# Patient Record
Sex: Female | Born: 1935 | Race: White | Hispanic: No | Smoking: Former smoker
Health system: Southern US, Community
[De-identification: ages and names within clinical notes are randomized; demographics above are authoritative.]

## PROBLEM LIST (undated history)

## (undated) VITALS — BP 189/88 | HR 61 | Temp 98.1°F | Resp 16 | Ht 62.0 in | Wt 169.0 lb

## (undated) DIAGNOSIS — N189 Chronic kidney disease, unspecified: Secondary | ICD-10-CM

## (undated) DIAGNOSIS — I10 Essential (primary) hypertension: Secondary | ICD-10-CM

## (undated) DIAGNOSIS — C50911 Malignant neoplasm of unspecified site of right female breast: Secondary | ICD-10-CM

## (undated) DIAGNOSIS — C641 Malignant neoplasm of right kidney, except renal pelvis: Secondary | ICD-10-CM

## (undated) DIAGNOSIS — C50919 Malignant neoplasm of unspecified site of unspecified female breast: Secondary | ICD-10-CM

## (undated) DIAGNOSIS — Z7901 Long term (current) use of anticoagulants: Secondary | ICD-10-CM

## (undated) DIAGNOSIS — R011 Cardiac murmur, unspecified: Secondary | ICD-10-CM

## (undated) DIAGNOSIS — K649 Unspecified hemorrhoids: Secondary | ICD-10-CM

## (undated) DIAGNOSIS — K219 Gastro-esophageal reflux disease without esophagitis: Secondary | ICD-10-CM

## (undated) DIAGNOSIS — I2699 Other pulmonary embolism without acute cor pulmonale: Secondary | ICD-10-CM

## (undated) DIAGNOSIS — Z923 Personal history of irradiation: Secondary | ICD-10-CM

## (undated) DIAGNOSIS — M1990 Unspecified osteoarthritis, unspecified site: Secondary | ICD-10-CM

## (undated) DIAGNOSIS — I499 Cardiac arrhythmia, unspecified: Secondary | ICD-10-CM

## (undated) DIAGNOSIS — N289 Disorder of kidney and ureter, unspecified: Secondary | ICD-10-CM

## (undated) HISTORY — DX: Unspecified hemorrhoids: K64.9

## (undated) HISTORY — DX: Other pulmonary embolism without acute cor pulmonale: I26.99

## (undated) HISTORY — PX: OTHER SURGICAL HISTORY: SHX169

## (undated) HISTORY — DX: Long term (current) use of anticoagulants: Z79.01

## (undated) HISTORY — DX: Unspecified osteoarthritis, unspecified site: M19.90

## (undated) HISTORY — PX: DILATION AND CURETTAGE OF UTERUS: SHX78

## (undated) HISTORY — PX: EYE SURGERY: SHX253

---

## 1988-04-27 DIAGNOSIS — C50919 Malignant neoplasm of unspecified site of unspecified female breast: Secondary | ICD-10-CM

## 1988-04-27 HISTORY — PX: BREAST EXCISIONAL BIOPSY: SUR124

## 1988-04-27 HISTORY — DX: Malignant neoplasm of unspecified site of unspecified female breast: C50.919

## 1988-04-27 HISTORY — PX: BREAST LUMPECTOMY: SHX2

## 1990-04-27 HISTORY — PX: BREAST EXCISIONAL BIOPSY: SUR124

## 1990-04-27 HISTORY — PX: BREAST LUMPECTOMY: SHX2

## 2005-02-13 ENCOUNTER — Ambulatory Visit: Payer: Self-pay | Admitting: Family Medicine

## 2006-05-13 ENCOUNTER — Ambulatory Visit: Payer: Self-pay | Admitting: Internal Medicine

## 2007-10-25 ENCOUNTER — Ambulatory Visit: Payer: Self-pay | Admitting: Internal Medicine

## 2009-05-02 ENCOUNTER — Ambulatory Visit: Payer: Self-pay | Admitting: Internal Medicine

## 2010-05-07 ENCOUNTER — Ambulatory Visit: Payer: Self-pay | Admitting: Internal Medicine

## 2011-07-30 ENCOUNTER — Ambulatory Visit: Payer: Self-pay | Admitting: Internal Medicine

## 2011-10-17 DIAGNOSIS — I10 Essential (primary) hypertension: Secondary | ICD-10-CM

## 2011-11-16 ENCOUNTER — Ambulatory Visit: Payer: Self-pay | Admitting: Ophthalmology

## 2011-11-30 ENCOUNTER — Ambulatory Visit: Payer: Self-pay | Admitting: Ophthalmology

## 2012-02-01 ENCOUNTER — Ambulatory Visit: Payer: Self-pay | Admitting: Ophthalmology

## 2012-04-27 HISTORY — PX: BREAST EXCISIONAL BIOPSY: SUR124

## 2012-08-24 ENCOUNTER — Ambulatory Visit: Payer: Self-pay | Admitting: Internal Medicine

## 2012-08-25 ENCOUNTER — Ambulatory Visit: Payer: Self-pay | Admitting: Internal Medicine

## 2012-09-06 ENCOUNTER — Ambulatory Visit: Payer: Self-pay | Admitting: General Surgery

## 2012-09-08 ENCOUNTER — Ambulatory Visit (INDEPENDENT_AMBULATORY_CARE_PROVIDER_SITE_OTHER): Payer: Medicare Other | Admitting: General Surgery

## 2012-09-08 ENCOUNTER — Other Ambulatory Visit: Payer: Self-pay

## 2012-09-08 ENCOUNTER — Encounter: Payer: Self-pay | Admitting: General Surgery

## 2012-09-08 VITALS — BP 144/86 | HR 86 | Resp 18 | Ht 62.0 in | Wt 189.0 lb

## 2012-09-08 DIAGNOSIS — N63 Unspecified lump in unspecified breast: Secondary | ICD-10-CM

## 2012-09-08 DIAGNOSIS — N6310 Unspecified lump in the right breast, unspecified quadrant: Secondary | ICD-10-CM

## 2012-09-08 DIAGNOSIS — R928 Other abnormal and inconclusive findings on diagnostic imaging of breast: Secondary | ICD-10-CM

## 2012-09-08 NOTE — Patient Instructions (Addendum)
Patient to return in 1 week for Nurse visit.       CARE AFTER BREAST BIOPSY  1. Leave the dressing on that your doctor applied after surgery. It is waterproof. You may bathe, shower and/or swim. The dressing will probably remain intact until your return office visit. If the dressing comes off, you will see small strips of tape against your skin on the incision. Do not remove these strips.  2. You may want to use a gauze,cloth or similar protection in your bra to prevent rubbing against your dressing and incision. This is not necessary, but you may feel more comfortable doing so.  3. It is recommended that you wear a bra day and night to give support to the breast. This will prevent the weight of the breast from pulling on the incision.  4. Your breast will feel hard and lumpy under the incision. Do not be alarmed. This is the underlying stitching of tissue. Softening of this tissue will occur in time.  5. Make sure you call the office and schedule an appointment in one week after your surgery. The office phone number is [phone]. The nurses at [facility] may have already done this for you.  6. You will notice about a week after your office visit that the strips of the tape on your incision will begin to loosen. These may then be removed.  7. Report to your doctor any of the following:  * Severe pain not relieved by your pain medication  *Redness of the incision  * Drainage from the incision  *Fever greater than 101 degrees

## 2012-09-08 NOTE — Progress Notes (Signed)
Patient ID: SAN RUA, female   DOB: 1932/03/24, 79 y.o.   MRN: [id number]  Chief Complaint  Patient presents with  . Other    mammogram    HPI Tara Ramos is a 79 y.o. female. Patient here today for abnormal mammogram referred by Dr Thalia Party. Most recent mammogram was done on 08/24/12 with a birad category 0. Additional views of the right breast were done on 08/25/12 with a birad category 5. The patient has a history of bilateral breast cancer diagnosed two years apart. She underwent lumpectomies and radiation therapy with both. She admits to regular self breast checks as well as regular mammograms. No known family history of breast problems. She denies any new problems with her breasts. No complaints at this time.   The patient's prior breast cancer treatment in the early 1990s was completed at [facility] in [place], [place]. Her surgeon was Paulla Dolly, M.D.  She received brachy therapy (Multiple "straws" placed in the skin) as well as whole breast radiation to the right breast, but only external beam radiation to the left breast.   HPI  Past Medical History  Diagnosis Date  . Hemorrhoids   . Arthritis   . Cancer     breast    Past Surgical History  Procedure Laterality Date  . Breast lumpectomy Left 1990  . Breast lumpectomy Right 1992  . Other surgical history      radiation therapy breast    Family History  Problem Relation Age of Onset  . Stroke Father   . Stomach cancer Mother   . Brain cancer Sister     Social History History  Substance Use Topics  . Smoking status: Former Smoker -- 20 years    Types: Cigarettes    Quit date: 04/27/1972  . Smokeless tobacco: Never Used  . Alcohol Use: Yes    No Known Allergies  Current Outpatient Prescriptions  Medication Sig Dispense Refill  . amLODipine (NORVASC) 2.5 MG tablet Take 2.5 mg by mouth daily.      . Cholecalciferol (VITAMIN D PO) Take by mouth daily.      . hydrochlorothiazide (HYDRODIURIL)  25 MG tablet Take 50 mg by mouth daily.      Marland Kitchen losartan (COZAAR) 100 MG tablet Take 100 mg by mouth daily.      . metoprolol (LOPRESSOR) 100 MG tablet Take 100 mg by mouth 2 (two) times daily.      . Multiple Vitamins-Minerals (CENTRUM SILVER ULTRA WOMENS PO) Take by mouth daily.      Marland Kitchen omeprazole (PRILOSEC) 20 MG capsule Take 20 mg by mouth daily.       No current facility-administered medications for this visit.    Review of Systems Review of Systems  Constitutional: Negative.   Respiratory: Negative.   Cardiovascular: Negative.     Blood pressure 144/86, pulse 86, resp. rate 18, height 5\' 2"  (1.575 m), weight 189 lb (85.73 kg).  Physical Exam Physical Exam  Constitutional: She appears well-developed and well-nourished.  Neck: Trachea normal. No mass and no thyromegaly present.  Cardiovascular: Normal rate, regular rhythm, normal heart sounds and normal pulses.   No murmur heard. Pulmonary/Chest: Effort normal and breath sounds normal. Right breast exhibits no inverted nipple, no mass, no nipple discharge, no skin change and no tenderness. Left breast exhibits no inverted nipple, no mass, no nipple discharge, no skin change and no tenderness.  Right breast 1/2 cup size bigger than left breast. Well healed scar in  the upper outer quadrant of right breast.   3 cm focal thickening between the breast and axillary scar of right breast.   Well healed scar in the upper outer quadrant of left breast.   Lymphadenopathy:    She has no cervical adenopathy.    She has no axillary adenopathy.    Data Reviewed Bilateral mammograms dated 08/24/2012 showed a focal asymmetry in the upper outer quadrant right breast with calcifications. BI-RAD-0.  Focal spot compression views of the right breast an ultrasound dated 08/25/2012 showed a spiculated nodule at the tie clock position ultrasound confirmed an irregular marginated hypoechoic mass measuring up to 1 cm with posterior acoustic shadowing.  BI-RAD-5.  Laboratory studies dated 07/19/2012 showed an elevated hemoglobin 15.2, white blood cell count 7400, platelet count 306,000, creatinine 0.8, normal liver function studies, minimal elevation of cholesterol.  Ultrasound examination of the right breast in the upper-outer quadrant showed an irregular 0.7 x 0.7 x 1.1 hypoechoic mass with posterior acoustic shadowing at the 10:00 position, 6 admission nipple.  The patient was amenable to vacuum biopsy. 10 cc of 0.5% Xylocaine with 0.25% Marcaine with 1:200,000 units epinephrine was usual anesthesia well tolerated. The breast was prepped with ChloraPrep and draped. Under ultrasound guidance the mass was a biopsied x8. Scant bleeding was noted. A postbiopsy clip was placed. The patient tolerated the procedure well. Skin defect was closed with benzoin and Steri-Strips followed by Telfa and Tegaderm dressing.  Assessment    Right breast mass now 20+ years status post treatment of right breast cancer.    Plan    The patient will be contacted when pathology is available.       [name] 09/09/2012, 7:40 AM

## 2012-09-09 ENCOUNTER — Encounter: Payer: Self-pay | Admitting: General Surgery

## 2012-09-09 DIAGNOSIS — R928 Other abnormal and inconclusive findings on diagnostic imaging of breast: Secondary | ICD-10-CM | POA: Insufficient documentation

## 2012-09-13 ENCOUNTER — Telehealth: Payer: Self-pay | Admitting: General Surgery

## 2012-09-13 LAB — PATHOLOGY

## 2012-09-13 NOTE — Telephone Encounter (Signed)
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 - MICROCALCIFICATIONS ARE PRESENT  The mammograms have been reviewed. Calcifications were present in the area of density.  Because of the previous radiation is difficult to determine whether this is sclerosis secondary to radiation or to malignancy. The need for formal excision of this area was reviewed with the patient.  She will return on Thursday, May 22 for a wound evaluation with the nurse. Arrangements will be made for a formal operative excision at a convenient date the near future.

## 2012-09-15 ENCOUNTER — Ambulatory Visit (INDEPENDENT_AMBULATORY_CARE_PROVIDER_SITE_OTHER): Payer: Medicare Other | Admitting: *Deleted

## 2012-09-15 DIAGNOSIS — N63 Unspecified lump in unspecified breast: Secondary | ICD-10-CM

## 2012-09-15 DIAGNOSIS — N6310 Unspecified lump in the right breast, unspecified quadrant: Secondary | ICD-10-CM

## 2012-09-15 NOTE — Patient Instructions (Addendum)
Patient here today for follow up post right breast biopsy. Minimal bruising noted.  The patient is aware that a heating pad may be used for comfort as needed.  Aware of pathology. Follow up as scheduled. 

## 2012-09-26 ENCOUNTER — Ambulatory Visit: Payer: Self-pay | Admitting: General Surgery

## 2012-11-16 ENCOUNTER — Telehealth: Payer: Self-pay

## 2012-11-16 NOTE — Telephone Encounter (Signed)
Patient called to say she had not heard back from [place] since her nurse visit on May 22nd. She would like a call back from Dr Lemar Livings to discuss her options for treatment and what the recommendations were. She was unaware of any previous discussions regarding any surgical intervention.

## 2012-12-07 ENCOUNTER — Ambulatory Visit (INDEPENDENT_AMBULATORY_CARE_PROVIDER_SITE_OTHER): Payer: Medicare Other | Admitting: General Surgery

## 2012-12-07 ENCOUNTER — Other Ambulatory Visit: Payer: Self-pay | Admitting: General Surgery

## 2012-12-07 ENCOUNTER — Encounter: Payer: Self-pay | Admitting: General Surgery

## 2012-12-07 ENCOUNTER — Telehealth: Payer: Self-pay | Admitting: *Deleted

## 2012-12-07 VITALS — BP 144/82 | HR 76 | Resp 14 | Ht 62.0 in | Wt 191.0 lb

## 2012-12-07 DIAGNOSIS — N63 Unspecified lump in unspecified breast: Secondary | ICD-10-CM

## 2012-12-07 DIAGNOSIS — R928 Other abnormal and inconclusive findings on diagnostic imaging of breast: Secondary | ICD-10-CM

## 2012-12-07 DIAGNOSIS — N6310 Unspecified lump in the right breast, unspecified quadrant: Secondary | ICD-10-CM | POA: Insufficient documentation

## 2012-12-07 NOTE — Patient Instructions (Addendum)
Patient to have right breast wire localization and biopsy scheduled.

## 2012-12-07 NOTE — Progress Notes (Signed)
Patient ID: Tara Ramos, female   DOB: 1931-11-15, 79 y.o.   MRN: [id number]  Chief Complaint  Patient presents with  . Follow-up    breast discussion    HPI Tara Ramos is a 79 y.o. female who presents for a breast discussion. At her last visit on 09/08/12 she had a right breast biopsy. She denies any problems at this time.   The patient's followup was delayed by my surgery in May.  The patient reports feeling well.  She had been notified that atypical cells had been identified on biopsy.  HPI  Past Medical History  Diagnosis Date  . Hemorrhoids   . Arthritis   . Cancer     breast    Past Surgical History  Procedure Laterality Date  . Breast lumpectomy Left 1990  . Breast lumpectomy Right 1992  . Other surgical history      radiation therapy breast  . Breast biopsy Right 2014    ATYPICAL DUCTAL PROLIFERATION WITH MARKED DISTORTION BY    Family History  Problem Relation Age of Onset  . Stroke Father   . Stomach cancer Mother   . Brain cancer Sister     Social History History  Substance Use Topics  . Smoking status: Former Smoker -- 20 years    Types: Cigarettes    Quit date: 04/27/1972  . Smokeless tobacco: Never Used  . Alcohol Use: Yes    Allergies  Allergen Reactions  . Morphine And Related Nausea And Vomiting    Current Outpatient Prescriptions  Medication Sig Dispense Refill  . amLODipine (NORVASC) 2.5 MG tablet Take 2.5 mg by mouth daily.      . Cholecalciferol (VITAMIN D PO) Take by mouth daily.      . hydrochlorothiazide (HYDRODIURIL) 25 MG tablet Take 50 mg by mouth daily.      Marland Kitchen losartan (COZAAR) 100 MG tablet Take 100 mg by mouth daily.      . metoprolol (LOPRESSOR) 100 MG tablet Take 100 mg by mouth 2 (two) times daily.      . Multiple Vitamins-Minerals (CENTRUM SILVER ULTRA WOMENS PO) Take by mouth daily.      Marland Kitchen omeprazole (PRILOSEC) 20 MG capsule Take 20 mg by mouth daily.       No current facility-administered medications for this  visit.    Review of Systems Review of Systems  Constitutional: Negative.   Respiratory: Negative.   Cardiovascular: Negative.     Blood pressure 144/82, pulse 76, resp. rate 14, height 5\' 2"  (1.575 m), weight 191 lb (86.637 kg).  Physical Exam Physical Exam  Constitutional: She is oriented to person, place, and time. She appears well-developed and well-nourished.  Neck: No thyromegaly present.  Cardiovascular: Normal rate, regular rhythm and normal heart sounds.   No murmur heard. Pulmonary/Chest: Effort normal and breath sounds normal. Right breast exhibits no inverted nipple, no mass, no nipple discharge, no skin change and no tenderness. Left breast exhibits no inverted nipple, no mass, no nipple discharge, no skin change and no tenderness.  Left breast well healed scar upper outer quadrant.   Right breast well healed scar in the upper outer quadrant.   Lymphadenopathy:    She has no cervical adenopathy.    She has no axillary adenopathy.  Neurological: She is alert and oriented to person, place, and time.  Skin: Skin is warm and dry.    Data Reviewed Atypical cells, excisional biopsy recommended on the May 2014 vacuum biopsy sample  Assessment  Atypical cells of the right breast, likely chronic scar, but with mammographic change formal excision is warranted.    Plan    The patient will have needle localization biopsy completed in the near future.       Tara Ramos 12/07/2012, 9:57 AM

## 2012-12-07 NOTE — Telephone Encounter (Signed)
Patient's surgery has been scheduled for 12-16-12 at [facility]. She is aware of all instructions and will call the office if she has futher questions. Paperwork has been mailed to the patient.

## 2012-12-12 ENCOUNTER — Ambulatory Visit: Payer: Self-pay | Admitting: General Surgery

## 2012-12-12 ENCOUNTER — Telehealth: Payer: Self-pay | Admitting: *Deleted

## 2012-12-12 LAB — POTASSIUM: Potassium: 4.3 mmol/L (ref 3.5–5.1)

## 2012-12-12 NOTE — Telephone Encounter (Signed)
Marsha from Pre-admit called regarding orders on this patient for surgery scheduled for Friday, 12-16-12. They are questioning permit and layman's terms because it does not say anything about removing the lesion. Please correct and let me know so I can send corrected copy. Thanks.

## 2012-12-12 NOTE — Addendum Note (Signed)
Addended by: [name] on: 12/12/2012 04:58 PM   Modules accepted: Orders

## 2012-12-13 NOTE — Progress Notes (Signed)
Faxed corrected orders and received per Dennie Bible in Pre-admit Testing.

## 2012-12-16 ENCOUNTER — Ambulatory Visit: Payer: Self-pay | Admitting: General Surgery

## 2012-12-16 DIAGNOSIS — D4860 Neoplasm of uncertain behavior of unspecified breast: Secondary | ICD-10-CM

## 2012-12-16 DIAGNOSIS — C50911 Malignant neoplasm of unspecified site of right female breast: Secondary | ICD-10-CM

## 2012-12-16 HISTORY — DX: Malignant neoplasm of unspecified site of right female breast: C50.911

## 2012-12-23 ENCOUNTER — Encounter: Payer: Self-pay | Admitting: General Surgery

## 2012-12-27 ENCOUNTER — Encounter: Payer: Self-pay | Admitting: General Surgery

## 2012-12-28 ENCOUNTER — Encounter: Payer: Self-pay | Admitting: General Surgery

## 2012-12-28 ENCOUNTER — Ambulatory Visit (INDEPENDENT_AMBULATORY_CARE_PROVIDER_SITE_OTHER): Payer: Medicare Other | Admitting: General Surgery

## 2012-12-28 VITALS — BP 182/78 | HR 62 | Resp 14 | Ht 62.0 in | Wt 187.0 lb

## 2012-12-28 DIAGNOSIS — N6310 Unspecified lump in the right breast, unspecified quadrant: Secondary | ICD-10-CM

## 2012-12-28 DIAGNOSIS — N63 Unspecified lump in unspecified breast: Secondary | ICD-10-CM

## 2012-12-28 MED ORDER — LETROZOLE 2.5 MG PO TABS: 2.5000 mg | ORAL_TABLET | Freq: Every day | ORAL | Status: DC

## 2012-12-28 NOTE — Progress Notes (Signed)
Patient ID: Tara Ramos, female   DOB: 1931/11/29, 79 y.o.   MRN: [id number]  Chief Complaint  Patient presents with  . Routine Post Op    right breast biopsy    HPI Tara Ramos is a 79 y.o. female. here today for her post op right breast biopsy done on 12/16/12. Patient states she is doing well.  Marland KitchenHPI  Past Medical History  Diagnosis Date  . Hemorrhoids   . Arthritis   . 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    Past Surgical History  Procedure Laterality Date  . Other surgical history      radiation therapy breast  . Breast lumpectomy Left 1990  . Breast lumpectomy Right 1992  . Breast biopsy Right 2014    ATYPICAL DUCTAL PROLIFERATION WITH MARKED DISTORTION BY    Family History  Problem Relation Age of Onset  . Stroke Father   . Stomach cancer Mother   . Brain cancer Sister     Social History History  Substance Use Topics  . Smoking status: Former Smoker -- 20 years    Types: Cigarettes    Quit date: 04/27/1972  . Smokeless tobacco: Never Used  . Alcohol Use: Yes    Allergies  Allergen Reactions  . Morphine And Related Nausea And Vomiting    Current Outpatient Prescriptions  Medication Sig Dispense Refill  . amLODipine (NORVASC) 2.5 MG tablet Take 2.5 mg by mouth daily.      . Cholecalciferol (VITAMIN D PO) Take by mouth daily.      . hydrochlorothiazide (HYDRODIURIL) 25 MG tablet Take 50 mg by mouth daily.      Marland Kitchen HYDROcodone-acetaminophen (NORCO/VICODIN) 5-325 MG per tablet Take 1 tablet by mouth as needed.      Marland Kitchen lisinopril (PRINIVIL,ZESTRIL) 20 MG tablet Take 1 tablet by mouth daily.      Marland Kitchen losartan (COZAAR) 100 MG tablet Take 100 mg by mouth daily.      . metoprolol (LOPRESSOR) 100 MG tablet Take 100 mg by mouth 2 (two) times daily.      . Multiple Vitamins-Minerals (CENTRUM SILVER ULTRA WOMENS PO)  Take by mouth daily.      Marland Kitchen omeprazole (PRILOSEC) 20 MG capsule Take 20 mg by mouth daily.      Marland Kitchen letrozole (FEMARA) 2.5 MG tablet Take 1 tablet (2.5 mg total) by mouth daily.  30 tablet  12   No current facility-administered medications for this visit.    Review of Systems Review of Systems  Constitutional: Negative.   Respiratory: Negative.   Cardiovascular: Negative.     Blood pressure 182/78, pulse 62, resp. rate 14, height 5\' 2"  (1.575 m), weight 187 lb (84.823 kg).  Physical Exam Physical Exam  Constitutional: She is oriented to person, place, and time. She appears well-developed and well-nourished.  Pulmonary/Chest:  Incision in the 9:00 position of the right breast is healing well. No erythema or induration.  Neurological: She is alert and oriented to person, place, and time.  Skin: Skin is warm and dry.    Data Reviewed Ultrasound examination shows a small seroma cavity measuring 1.4 x 3.2 x 4.3 cm approximately 2 cm deep to the skin incision the  Assessment    Recurrent right breast cancer, DCIS as well as invasive mammary carcinoma. Grade 2.    Plan    The  case was discussed informally with radiation therapy. Based on her previous treatment with brachii therapy and external brain radiation a digital radiation therapy is not felt to be safe.  Considering the strongly ER positive nature of the tumor, treatment with aromatase inhibitor as appropriate.   [name] 12/30/2012, 10:54 AM

## 2012-12-28 NOTE — Patient Instructions (Addendum)
Continue self breast exams. Call office for any new breast issues or concerns. prescription for Femara

## 2012-12-30 ENCOUNTER — Encounter: Payer: Self-pay | Admitting: General Surgery

## 2013-01-02 ENCOUNTER — Encounter: Payer: Self-pay | Admitting: General Surgery

## 2013-01-09 ENCOUNTER — Encounter: Payer: Self-pay | Admitting: General Surgery

## 2013-01-13 ENCOUNTER — Encounter: Payer: Self-pay | Admitting: General Surgery

## 2013-01-30 ENCOUNTER — Encounter: Payer: Self-pay | Admitting: General Surgery

## 2013-01-30 ENCOUNTER — Ambulatory Visit (INDEPENDENT_AMBULATORY_CARE_PROVIDER_SITE_OTHER): Payer: Medicare Other | Admitting: General Surgery

## 2013-01-30 VITALS — BP 144/80 | HR 68 | Resp 14 | Ht 62.0 in | Wt 183.0 lb

## 2013-01-30 DIAGNOSIS — C50919 Malignant neoplasm of unspecified site of unspecified female breast: Secondary | ICD-10-CM

## 2013-01-30 DIAGNOSIS — C50911 Malignant neoplasm of unspecified site of right female breast: Secondary | ICD-10-CM

## 2013-01-30 NOTE — Patient Instructions (Addendum)
Patient advised to use a heating pad on right breast to help resolve fullness. Patient to return in 4 months.

## 2013-01-30 NOTE — Progress Notes (Signed)
Patient ID: Tara Ramos, female   DOB: 06-24-1931, 79 y.o.   MRN: [id number]  Chief Complaint  Patient presents with  . Follow-up    1 week follow up breast cancer    HPI Tara Ramos is a 79 y.o. female here today for her 4 week post op right breast biopsy done on 12/16/12. Patient states she is doing well. She had some concern about mild residual fullness at the wide excision site. Marland KitchenHPI   HPI  Past Medical History  Diagnosis Date  . Hemorrhoids   . Arthritis   . 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    Past Surgical History  Procedure Laterality Date  . Other surgical history      radiation therapy breast  . Breast lumpectomy Left 1990  . Breast lumpectomy Right 1992  . Breast biopsy Right 2014    ATYPICAL DUCTAL PROLIFERATION WITH MARKED DISTORTION BY    Family History  Problem Relation Age of Onset  . Stroke Father   . Stomach cancer Mother   . Brain cancer Sister     Social History History  Substance Use Topics  . Smoking status: Former Smoker -- 20 years    Types: Cigarettes    Quit date: 04/27/1972  . Smokeless tobacco: Never Used  . Alcohol Use: Yes    Allergies  Allergen Reactions  . Morphine And Related Nausea And Vomiting    Current Outpatient Prescriptions  Medication Sig Dispense Refill  . amLODipine (NORVASC) 2.5 MG tablet Take 2.5 mg by mouth daily.      . Cholecalciferol (VITAMIN D PO) Take by mouth daily.      . hydrochlorothiazide (HYDRODIURIL) 25 MG tablet Take 50 mg by mouth daily.      Marland Kitchen HYDROcodone-acetaminophen (NORCO/VICODIN) 5-325 MG per tablet Take 1 tablet by mouth as needed.      Marland Kitchen letrozole (FEMARA) 2.5 MG tablet Take 1 tablet (2.5 mg total) by mouth daily.  30 tablet  12  . lisinopril (PRINIVIL,ZESTRIL) 20 MG tablet Take 1 tablet by mouth daily.      Marland Kitchen losartan (COZAAR) 100 MG  tablet Take 100 mg by mouth daily.      . metoprolol (LOPRESSOR) 100 MG tablet Take 100 mg by mouth 2 (two) times daily.      . Multiple Vitamins-Minerals (CENTRUM SILVER ULTRA WOMENS PO) Take by mouth daily.      Marland Kitchen omeprazole (PRILOSEC) 20 MG capsule Take 20 mg by mouth daily.       No current facility-administered medications for this visit.    Review of Systems Review of Systems  Constitutional: Negative.   Respiratory: Negative.   Cardiovascular: Negative.     Blood pressure 144/80, pulse 68, resp. rate 14, height 5\' 2"  (1.575 m), weight 183 lb (83.008 kg).  Physical Exam Physical Exam  Constitutional: She is oriented to person, place, and time.  Pulmonary/Chest: Right breast exhibits no inverted nipple, no mass, no nipple discharge, no skin change and no tenderness.  3 x 5 cm fullness at right breast biopsy site.   Neurological: She is alert and oriented to person, place, and time.  Skin: Skin is warm and dry.    Data Reviewed T1b, Nx. ER +; PR + her 2 neu not over expressed.  Assessment    Doing well status post reexcision of local  right breast recurrence status post previous wide excision and breast radiation/axillary dissection.    Plan    The fullness at the biopsy site should resolve as local heat. No evidence of erythema or induration.  Previous case discussion with radiation oncology suggested no additional RT therapy option open. The patient reports she is tolerating her Femara without ill effect.       [name] 01/31/2013, 6:58 AM

## 2013-01-31 ENCOUNTER — Encounter: Payer: Self-pay | Admitting: General Surgery

## 2013-06-01 ENCOUNTER — Ambulatory Visit: Payer: Self-pay | Admitting: General Surgery

## 2013-06-26 ENCOUNTER — Encounter: Payer: Self-pay | Admitting: General Surgery

## 2013-06-26 ENCOUNTER — Ambulatory Visit (INDEPENDENT_AMBULATORY_CARE_PROVIDER_SITE_OTHER): Payer: Medicare HMO | Admitting: General Surgery

## 2013-06-26 VITALS — BP 180/100 | HR 78 | Resp 14 | Ht 62.0 in | Wt 181.0 lb

## 2013-06-26 DIAGNOSIS — C50911 Malignant neoplasm of unspecified site of right female breast: Secondary | ICD-10-CM

## 2013-06-26 DIAGNOSIS — C50919 Malignant neoplasm of unspecified site of unspecified female breast: Secondary | ICD-10-CM

## 2013-06-26 NOTE — Progress Notes (Signed)
Patient ID: Tara Ramos, female   DOB: [birth year], 80 y.o.   MRN: [id number]  Chief Complaint  Patient presents with  . Follow-up    breast cancer    HPI Tara Ramos is a 80 y.o. female here today for her four month follow up breast cancer. Patient states she is doing well.  HPI  Past Medical History  Diagnosis Date  . Hemorrhoids   . Arthritis   . 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    Past Surgical History  Procedure Laterality Date  . Other surgical history      radiation therapy breast  . Breast lumpectomy Left 1990  . Breast lumpectomy Right 1992  . Breast biopsy Right 2014    ATYPICAL DUCTAL PROLIFERATION WITH MARKED DISTORTION BY    Family History  Problem Relation Age of Onset  . Stroke Father   . Stomach cancer Mother   . Brain cancer Sister     Social History History  Substance Use Topics  . Smoking status: Former Smoker -- 20 years    Types: Cigarettes    Quit date: 04/27/1972  . Smokeless tobacco: Never Used  . Alcohol Use: Yes    Allergies  Allergen Reactions  . Morphine And Related Nausea And Vomiting    Current Outpatient Prescriptions  Medication Sig Dispense Refill  . amLODipine (NORVASC) 2.5 MG tablet Take 2.5 mg by mouth daily.      . Cholecalciferol (VITAMIN D PO) Take by mouth daily.      . hydrochlorothiazide (HYDRODIURIL) 25 MG tablet Take 50 mg by mouth daily.      Marland Kitchen HYDROcodone-acetaminophen (NORCO/VICODIN) 5-325 MG per tablet Take 1 tablet by mouth as needed.      Marland Kitchen letrozole (FEMARA) 2.5 MG tablet Take 1 tablet (2.5 mg total) by mouth daily.  30 tablet  12  . lisinopril (PRINIVIL,ZESTRIL) 20 MG tablet Take 1 tablet by mouth daily.      Marland Kitchen losartan (COZAAR) 100 MG tablet Take 100 mg by mouth daily.      . metoprolol (LOPRESSOR) 100 MG tablet Take 100 mg by mouth 2 (two) times  daily.      . Multiple Vitamins-Minerals (CENTRUM SILVER ULTRA WOMENS PO) Take by mouth daily.      Marland Kitchen omeprazole (PRILOSEC) 20 MG capsule Take 20 mg by mouth daily.       No current facility-administered medications for this visit.    Review of Systems Review of Systems  Constitutional: Negative.   Respiratory: Negative.   Cardiovascular: Negative.     Blood pressure 180/100, pulse 78, resp. rate 14, height 5' 2"  (1.575 m), weight 181 lb (82.101 kg).  Physical Exam Physical Exam  Constitutional: She is oriented to person, place, and time. She appears well-developed and well-nourished.  Eyes: Conjunctivae are normal.  Neck: Neck supple.  Cardiovascular: Normal rate and regular rhythm.   Murmur heard. Heart murmur no change.  Pulmonary/Chest: Breath sounds normal. Right breast exhibits no inverted nipple, no mass, no nipple discharge, no skin change and no tenderness. Left breast exhibits no inverted nipple, no mass, no nipple discharge, no skin change and no tenderness.  Right breast well healed incision upper outer quadrant and axilla . Left breast well healed incision upper outer quadrant.   Lymphadenopathy:    She has no axillary adenopathy.  Neurological: She  is alert and oriented to person, place, and time.  Skin: Skin is warm and dry.       Assessment    Doing well status post breast conservation.  The patient is tolerating her Femara without ill effect.     Plan    We'll arrange for followup exam in mammograms in 2 months.    Patient to return in May 2015 once bilateral diagnostic mammogram has been completed.    Tara Ramos 06/27/2013, 4:39 PM

## 2013-06-26 NOTE — Patient Instructions (Addendum)
Patient to return in May bilateral diagnostic mammogram.

## 2013-08-31 ENCOUNTER — Encounter: Payer: Self-pay | Admitting: General Surgery

## 2013-08-31 ENCOUNTER — Ambulatory Visit: Payer: Self-pay | Admitting: General Surgery

## 2013-09-14 ENCOUNTER — Encounter: Payer: Self-pay | Admitting: General Surgery

## 2013-09-14 ENCOUNTER — Ambulatory Visit (INDEPENDENT_AMBULATORY_CARE_PROVIDER_SITE_OTHER): Payer: Medicare HMO | Admitting: General Surgery

## 2013-09-14 VITALS — BP 182/88 | HR 62 | Resp 12 | Ht 62.0 in | Wt 184.0 lb

## 2013-09-14 DIAGNOSIS — C50911 Malignant neoplasm of unspecified site of right female breast: Secondary | ICD-10-CM

## 2013-09-14 DIAGNOSIS — C50919 Malignant neoplasm of unspecified site of unspecified female breast: Secondary | ICD-10-CM

## 2013-09-14 NOTE — Progress Notes (Signed)
Patient ID: Tara Ramos, female   DOB: 1931-09-30, 80 y.o.   MRN: [id number]  Chief Complaint  Patient presents with  . Follow-up    mammogram    HPI Tara Ramos is a 80 y.o. female who presents for a breast evaluation. The most recent mammogram was done on 08/31/13 at [facility]. Patient does perform regular self breast checks and gets regular mammograms done.    HPI  Past Medical History  Diagnosis Date  . Hemorrhoids   . Arthritis   . 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 .    Past Surgical History  Procedure Laterality Date  . Other surgical history      radiation therapy breast  . Breast lumpectomy Left 1990  . Breast lumpectomy Right 1992  . Breast biopsy Right 2014    ATYPICAL DUCTAL PROLIFERATION WITH MARKED DISTORTION BY    Family History  Problem Relation Age of Onset  . Stroke Father   . Stomach cancer Mother   . Brain cancer Sister     Social History History  Substance Use Topics  . Smoking status: Former Smoker -- 20 years    Types: Cigarettes    Quit date: 04/27/1972  . Smokeless tobacco: Never Used  . Alcohol Use: Yes    Allergies  Allergen Reactions  . Morphine And Related Nausea And Vomiting    Current Outpatient Prescriptions  Medication Sig Dispense Refill  . amLODipine (NORVASC) 2.5 MG tablet Take 2.5 mg by mouth daily.      . Cholecalciferol (VITAMIN D PO) Take by mouth daily.      . hydrochlorothiazide (HYDRODIURIL) 25 MG tablet Take 50 mg by mouth daily.      Marland Kitchen HYDROcodone-acetaminophen (NORCO/VICODIN) 5-325 MG per tablet Take 1 tablet by mouth as needed.      Marland Kitchen letrozole (FEMARA) 2.5 MG tablet Take 1 tablet (2.5 mg total) by mouth daily.  30 tablet  12  . lisinopril (PRINIVIL,ZESTRIL) 20 MG tablet Take 1 tablet by  mouth daily.      Marland Kitchen losartan (COZAAR) 100 MG tablet Take 100 mg by mouth daily.      . metoprolol (LOPRESSOR) 100 MG tablet Take 100 mg by mouth 2 (two) times daily.      . Multiple Vitamins-Minerals (CENTRUM SILVER ULTRA WOMENS PO) Take by mouth daily.      Marland Kitchen omeprazole (PRILOSEC) 20 MG capsule Take 20 mg by mouth daily.       No current facility-administered medications for this visit.    Review of Systems Review of Systems  Constitutional: Negative.   Respiratory: Negative.   Cardiovascular: Negative.     Blood pressure 182/88, pulse 62, resp. rate 12, height 5' 2"  (1.575 m), weight 184 lb (83.462 kg).  Physical Exam Physical Exam  Constitutional: She is oriented to person, place, and time. She appears well-developed and well-nourished.  Eyes: Conjunctivae are normal.  Neck: Neck supple.  Cardiovascular: Normal rate and normal heart sounds.   Stable heart murmer  Pulmonary/Chest: Effort normal and breath sounds normal. Right breast exhibits no inverted nipple, no mass, no nipple discharge, no skin change and no tenderness. Left breast exhibits no inverted nipple, no mass, no nipple discharge, no skin change and no tenderness.    Right breast thickening at surgical site.  Lymphadenopathy:    She has no cervical adenopathy.    She has no axillary adenopathy.  Neurological: She is alert and oriented to person, place, and time.  Skin: Skin is warm and dry.    Data Reviewed Bilateral mammogram Aug 31, 2013: BIRAD 2.  Films reviewed. Previous calcifications removed. Patient reports bone density completed by PCP in the last few months, she has been started on Fosamax.  Assessment    Doing well s/p recurrent right breat cancer      Plan    Clinical exam in six months.      PCP: Arther Dames, Adriana Reams Natayla Cadenhead 09/14/2013, 9:13 AM

## 2013-09-14 NOTE — Patient Instructions (Signed)
Patient to return in six months for breast check.

## 2014-02-26 ENCOUNTER — Encounter: Payer: Self-pay | Admitting: General Surgery

## 2014-03-08 ENCOUNTER — Ambulatory Visit: Payer: Medicare HMO | Admitting: General Surgery

## 2014-03-21 ENCOUNTER — Encounter: Payer: Self-pay | Admitting: *Deleted

## 2014-07-31 ENCOUNTER — Emergency Department
Admit: 2014-07-31 | Disposition: A | Discharge status: Home / Self Care | Payer: Self-pay | Admitting: Emergency Medicine

## 2014-08-01 LAB — CBC WITH DIFFERENTIAL/PLATELET
BASOS ABS: 0.1 10*3/uL (ref 0.0–0.1)
Basophil %: 0.4 %
Eosinophil #: 0 10*3/uL (ref 0.0–0.7)
Eosinophil %: 0.1 %
HCT: 50.5 % — ABNORMAL HIGH (ref 35.0–47.0)
HGB: 16.3 g/dL — ABNORMAL HIGH (ref 12.0–16.0)
LYMPHS ABS: 0.9 10*3/uL — AB (ref 1.0–3.6)
Lymphocyte %: 5.5 %
MCH: 27.7 pg (ref 26.0–34.0)
MCHC: 32.3 g/dL (ref 32.0–36.0)
MCV: 86 fL (ref 80–100)
Monocyte #: 0.9 x10 3/mm (ref 0.2–0.9)
Monocyte %: 6.1 %
NEUTROS ABS: 13.6 10*3/uL — AB (ref 1.4–6.5)
Neutrophil %: 87.9 %
Platelet: 398 10*3/uL (ref 150–440)
RBC: 5.88 10*6/uL — AB (ref 3.80–5.20)
RDW: 14.1 % (ref 11.5–14.5)
WBC: 15.5 10*3/uL — ABNORMAL HIGH (ref 3.6–11.0)

## 2014-08-01 LAB — COMPREHENSIVE METABOLIC PANEL
ALK PHOS: 80 U/L
ANION GAP: 13 (ref 7–16)
Albumin: 4.1 g/dL
BUN: 19 mg/dL
Bilirubin,Total: 0.7 mg/dL
CALCIUM: 9.6 mg/dL
CO2: 28 mmol/L
Chloride: 98 mmol/L — ABNORMAL LOW
Creatinine: 1.01 mg/dL — ABNORMAL HIGH
GFR CALC NON AF AMER: 53 — AB
Glucose: 149 mg/dL — ABNORMAL HIGH
POTASSIUM: 3.9 mmol/L
SGOT(AST): 22 U/L
SGPT (ALT): 19 U/L
Sodium: 139 mmol/L
Total Protein: 7.4 g/dL

## 2014-08-08 ENCOUNTER — Ambulatory Visit
Admit: 2014-08-08 | Disposition: A | Discharge status: Home / Self Care | Payer: Self-pay | Attending: Oncology | Admitting: Oncology

## 2014-08-08 LAB — CREATININE, SERUM
Creatinine: 0.96 mg/dL
GFR CALC NON AF AMER: 56 — AB

## 2014-08-10 ENCOUNTER — Other Ambulatory Visit: Payer: Self-pay | Admitting: Internal Medicine

## 2014-08-10 DIAGNOSIS — Z853 Personal history of malignant neoplasm of breast: Secondary | ICD-10-CM

## 2014-08-14 NOTE — Op Note (Signed)
PATIENT NAME:  Tara Ramos, Tara Ramos MR#:  [id number] DATE OF BIRTH:  02-19-1932  DATE OF PROCEDURE:  02/01/2012  PREOPERATIVE DIAGNOSIS:  Cataract, left eye.    POSTOPERATIVE DIAGNOSIS:  Cataract, left eye.  PROCEDURE PERFORMED:  Extracapsular cataract extraction using phacoemulsification with placement of an Alcon SN6CWS, 23.0-diopter posterior chamber lens, serial # [id number].   SURGEON:  Tara Back. Winefred Hillesheim, MD  ASSISTANT:  None.  ANESTHESIA:  4% lidocaine and 0.75% Marcaine in a 50/50 mixture with 10 units/mL of Hylenex added, given as peribulbar.  ANESTHESIOLOGIST:  Dr. Ronelle Nigh   COMPLICATIONS:  None.  ESTIMATED BLOOD LOSS:  Less than 1 mL.  DESCRIPTION OF PROCEDURE:  The patient was brought to the operating room and given a peribulbar block.  The patient was then prepped and draped in the usual fashion.  The vertical rectus muscles were imbricated using 5-0 silk sutures.  These sutures were then clamped to the sterile drapes as bridle sutures.  A limbal peritomy was performed extending two clock hours and hemostasis was obtained with cautery.  A partial thickness scleral groove was made at the surgical limbus and dissected anteriorly in a lamellar dissection using an Alcon crescent knife.  The anterior chamber was entered supero-temporally with a Superblade and through the lamellar dissection with a 2.6 mm keratome.  DisCoVisc was used to replace the aqueous and a continuous tear capsulorrhexis was carried out.  Hydrodissection and hydrodelineation were carried out with balanced salt and a 27 gauge canula.  The nucleus was rotated to confirm the effectiveness of the hydrodissection.  Phacoemulsification was carried out using a divide-and-conquer technique.  Total ultrasound time was 1 minute and 51 seconds with an average power of 21.7 percent, CDE 39.7.  Irrigation/aspiration was used to remove the residual cortex.  DisCoVisc was used to inflate the capsule and the internal incision  was enlarged to 3 mm with the crescent knife.  The intraocular lens was folded and inserted into the capsular bag using the AcrySert delivery system.  Irrigation/aspiration was used to remove the residual DisCoVisc.  Miostat was injected into the anterior chamber through the paracentesis track to inflate the anterior chamber and induce miosis.  The wound was checked for leaks and none were found. The conjunctiva was closed with cautery and the bridle sutures were removed.  Two drops of 0.3% Vigamox were placed on the eye.   An eye shield was placed on the eye.  The patient was discharged to the recovery room in good condition.  ____________________________ Tara Back Natesha Hassey, MD sad:drc D: 02/01/2012 13:53:57 ET T: 02/01/2012 14:33:50 ET JOB#: [id number]  cc: Remo Lipps A. Shivank Pinedo, MD, <Dictator> Martie Lee MD ELECTRONICALLY SIGNED 02/08/2012 14:11

## 2014-08-14 NOTE — Op Note (Signed)
PATIENT NAME:  Tara Ramos, Tara Ramos MR#:  [id number] DATE OF BIRTH:  05-Jul-1931  DATE OF PROCEDURE:  11/30/2011  PREOPERATIVE DIAGNOSIS:  Cataract, right eye.   POSTOPERATIVE DIAGNOSIS:  Cataract, right eye.  PROCEDURE PERFORMED:  Extracapsular cataract extraction using phacoemulsification with placement of an Alcon SN6CWS, 22.0-diopter posterior chamber lens, serial # [id number].   SURGEON:  Loura Back. Laksh Hinners, MD  ASSISTANT:  None.  ANESTHESIA:  4% lidocaine and 0.75% Marcaine in a 50/50 mixture with 10 units per mL of Hylenex added, given as peribulbar.  ANESTHESIOLOGIST:  Dr. [place] Service   COMPLICATIONS:  None.  ESTIMATED BLOOD LOSS:  Less than 1 mL.  DESCRIPTION OF PROCEDURE:  The patient was brought to the operating room and given a peribulbar block.  The patient was then prepped and draped in the usual fashion.  The vertical rectus muscles were imbricated using 5-0 silk sutures.  These sutures were then clamped to the sterile drapes as bridle sutures.  A limbal peritomy was performed extending two clock hours and hemostasis was obtained with cautery.  A partial thickness scleral groove was made at the surgical limbus and dissected anteriorly in a lamellar dissection using an Alcon crescent knife.  The anterior chamber was entered superonasally with a Superblade and through the lamellar dissection with a 2.6 mm keratome.  DisCoVisc was used to replace the aqueous and a continuous tear capsulorrhexis was carried out.  Hydrodissection and hydrodelineation were carried out with balanced salt and a 27 gauge canula.  The nucleus was rotated to confirm the effectiveness of the hydrodissection.  Phacoemulsification was carried out using a divide-and-conquer technique.  Total ultrasound time was 1 minute and 50 seconds with an average power of 25 percent, CDE 41.85.  Irrigation/aspiration was used to remove the residual cortex.  DisCoVisc was used to inflate the capsule and the internal  incision was enlarged to 3 mm with the crescent knife.  The intraocular lens was folded and inserted into the capsular bag using the AcrySert delivery system.  Irrigation/aspiration was used to remove the residual DisCoVisc.  Miostat was injected into the anterior chamber through the paracentesis track to inflate the anterior chamber and induce miosis.  The wound was checked for leaks and none were found. The conjunctiva was closed with cautery and the bridle sutures were removed.  Two drops of 0.3% Vigamox were placed on the eye.   An eye shield was placed on the eye.  The patient was discharged to the recovery room in good condition.  ____________________________ Loura Back Dessa Ledee, MD sad:drc D: 11/30/2011 13:40:36 ET T: 11/30/2011 13:48:15 ET JOB#: [id number]  cc: Remo Lipps A. Makinzie Considine, MD, <Dictator> Martie Lee MD ELECTRONICALLY SIGNED 12/07/2011 12:32

## 2014-08-17 NOTE — Op Note (Signed)
PATIENT NAME:  Tara Ramos, DIKE MR#:  [id number] DATE OF BIRTH:  07/13/1931  DATE OF PROCEDURE:  12/16/2012  PREOPERATIVE DIAGNOSIS: Atypical ductal hyperplasia of the right breast.   POSTOPERATIVE DIAGNOSIS: Atypical ductal hyperplasia of the right breast.   OPERATIVE PROCEDURE: Right breast biopsy with needle localization and ultrasound guidance.   SURGEON: Hervey Ard, MD  ANESTHESIA: General by LMA under Dr. Benjamine Mola, Marcaine 0.5% with 1:200,000 units epinephrine, 30 mL local infiltration.   ESTIMATED BLOOD LOSS: Minimal.   CLINICAL NOTE: This 81 year old woman had undergone a stereotactic biopsy with a finding of a focal area of ADH. She had been encouraged to have wide excision to confirm no upstaging to DCIS. The patient underwent preoperative needle localization with identification of the thickened portion of the wire behind the previously-placed stereotactic clip.   OPERATIVE NOTE: With the patient under adequate general anesthesia, the breast was prepped with ChloraPrep and draped. Ultrasound was used to identify the path of the localizing wire. A curvilinear incision was made in the 10 o'clock position of the breast and carried down through the skin and subcutaneous tissue with hemostasis achieved with electrocautery. The area of the previous biopsy was notable due to focal local thickening of the adipose tissue. The wedge of tissue measuring approximately 3 x 3 x 5 cm was excised, orientated, and specimen radiograph confirmed and intact wire as well as the previously-placed biopsy clip. The wound was closed in layers with 2-0 Vicryl figure-of-eight sutures. The skin was closed with a running 4-0 Vicryl subcuticular suture. Benzoin, Steri-Strips, Telfa and Tegaderm dressings were applied. The patient tolerated the procedure well and was taken to the recovery room in stable condition.    ____________________________ Robert Bellow, MD jwb:cs D: 12/16/2012 19:39:15  ET T: 12/16/2012 21:26:28 ET JOB#: [id number]  cc: Robert Bellow, MD, <Dictator> Adin Hector, MD JEFFREY Amedeo Kinsman MD ELECTRONICALLY SIGNED 12/19/2012 13:11

## 2014-09-06 ENCOUNTER — Ambulatory Visit: Admission: RE | Admit: 2014-09-06 | Payer: PPO | Source: Ambulatory Visit

## 2014-09-06 ENCOUNTER — Other Ambulatory Visit: Payer: Self-pay | Admitting: Internal Medicine

## 2014-09-06 ENCOUNTER — Ambulatory Visit
Admission: RE | Admit: 2014-09-06 | Discharge: 2014-09-06 | Disposition: A | Discharge status: Home / Self Care | Payer: PPO | Source: Ambulatory Visit | Attending: Internal Medicine | Admitting: Internal Medicine

## 2014-09-06 ENCOUNTER — Ambulatory Visit: Payer: PPO

## 2014-09-06 DIAGNOSIS — Z853 Personal history of malignant neoplasm of breast: Secondary | ICD-10-CM | POA: Insufficient documentation

## 2014-09-06 HISTORY — DX: Personal history of irradiation: Z92.3

## 2014-09-12 ENCOUNTER — Telehealth: Payer: Self-pay

## 2014-09-12 ENCOUNTER — Other Ambulatory Visit: Payer: Self-pay | Admitting: *Deleted

## 2014-09-12 DIAGNOSIS — R948 Abnormal results of function studies of other organs and systems: Secondary | ICD-10-CM

## 2014-09-12 DIAGNOSIS — C649 Malignant neoplasm of unspecified kidney, except renal pelvis: Secondary | ICD-10-CM

## 2014-09-12 DIAGNOSIS — C50919 Malignant neoplasm of unspecified site of unspecified female breast: Secondary | ICD-10-CM

## 2014-09-12 DIAGNOSIS — M546 Pain in thoracic spine: Secondary | ICD-10-CM

## 2014-09-12 DIAGNOSIS — R937 Abnormal findings on diagnostic imaging of other parts of musculoskeletal system: Secondary | ICD-10-CM

## 2014-09-12 NOTE — Addendum Note (Signed)
Addended by: Telford Nab on: 09/12/2014 10:55 AM   Modules accepted: Orders

## 2014-09-12 NOTE — Telephone Encounter (Signed)
Oncology Nurse Navigator Documentation  Oncology Nurse Navigator Flowsheets 09/12/2014  Navigator Encounter Type Telephone  Time Spent with Patient 15   Tara Ramos notified that she will need an MRI of her spine to follow up on bone scan uptake in that area. Scheduling to notify of date/time.

## 2014-09-15 ENCOUNTER — Ambulatory Visit: Payer: PPO

## 2014-09-18 ENCOUNTER — Ambulatory Visit: Payer: PPO

## 2014-09-18 ENCOUNTER — Telehealth: Payer: Self-pay

## 2014-09-18 NOTE — Telephone Encounter (Signed)
Oncology Nurse Navigator Documentation  Oncology Nurse Navigator Flowsheets 09/12/2014 09/18/2014  Navigator Encounter Type Telephone Telephone  Time Spent with Patient 15 30   Came into clinic today after going to MRI appt. Appt had been changed to 5/31 due to insurance per scheduling. Unable to have MRI done today. Pt now has appt for MRI 5/31 and F/U with Dr Oliva Bustard on on 6/1 at 1030. Apologized for scheduling being unable to reach her to notify of appt change.

## 2014-09-20 ENCOUNTER — Ambulatory Visit: Payer: PPO | Admitting: Oncology

## 2014-09-25 ENCOUNTER — Ambulatory Visit
Admission: RE | Admit: 2014-09-25 | Discharge: 2014-09-25 | Disposition: A | Discharge status: Home / Self Care | Payer: PPO | Source: Ambulatory Visit | Attending: Oncology | Admitting: Oncology

## 2014-09-25 DIAGNOSIS — C50919 Malignant neoplasm of unspecified site of unspecified female breast: Secondary | ICD-10-CM | POA: Insufficient documentation

## 2014-09-25 DIAGNOSIS — C649 Malignant neoplasm of unspecified kidney, except renal pelvis: Secondary | ICD-10-CM | POA: Insufficient documentation

## 2014-09-25 DIAGNOSIS — M546 Pain in thoracic spine: Secondary | ICD-10-CM | POA: Diagnosis present

## 2014-09-25 DIAGNOSIS — M4854XA Collapsed vertebra, not elsewhere classified, thoracic region, initial encounter for fracture: Secondary | ICD-10-CM | POA: Diagnosis not present

## 2014-09-25 DIAGNOSIS — R937 Abnormal findings on diagnostic imaging of other parts of musculoskeletal system: Secondary | ICD-10-CM | POA: Diagnosis present

## 2014-09-25 DIAGNOSIS — D1809 Hemangioma of other sites: Secondary | ICD-10-CM | POA: Insufficient documentation

## 2014-09-25 MED ORDER — GADOBENATE DIMEGLUMINE 529 MG/ML IV SOLN
15.0000 mL | Freq: Once | INTRAVENOUS | Status: AC | PRN
  Administered 2014-09-25: 15 mL via INTRAVENOUS

## 2014-09-26 ENCOUNTER — Inpatient Hospital Stay: Payer: PPO | Attending: Oncology | Admitting: Oncology

## 2014-09-26 VITALS — BP 194/84 | HR 58 | Temp 96.2°F | Wt 171.1 lb

## 2014-09-26 DIAGNOSIS — Z923 Personal history of irradiation: Secondary | ICD-10-CM | POA: Insufficient documentation

## 2014-09-26 DIAGNOSIS — Z853 Personal history of malignant neoplasm of breast: Secondary | ICD-10-CM | POA: Diagnosis not present

## 2014-09-26 DIAGNOSIS — N2889 Other specified disorders of kidney and ureter: Secondary | ICD-10-CM | POA: Insufficient documentation

## 2014-09-26 NOTE — Progress Notes (Signed)
Patient former smoker.  Does not have living will.

## 2014-09-28 ENCOUNTER — Encounter: Payer: Self-pay | Admitting: Oncology

## 2014-09-28 DIAGNOSIS — D0510 Intraductal carcinoma in situ of unspecified breast: Secondary | ICD-10-CM | POA: Insufficient documentation

## 2014-09-28 DIAGNOSIS — M8580 Other specified disorders of bone density and structure, unspecified site: Secondary | ICD-10-CM | POA: Insufficient documentation

## 2014-09-28 DIAGNOSIS — I10 Essential (primary) hypertension: Secondary | ICD-10-CM | POA: Insufficient documentation

## 2014-09-28 DIAGNOSIS — K219 Gastro-esophageal reflux disease without esophagitis: Secondary | ICD-10-CM | POA: Insufficient documentation

## 2014-09-28 DIAGNOSIS — E785 Hyperlipidemia, unspecified: Secondary | ICD-10-CM | POA: Insufficient documentation

## 2014-09-28 NOTE — Progress Notes (Signed)
[place] @ [facility] [phone]  Fax:([phone]) [place]: 02/06/1932  MR#: [id number]  XBM#:[id number]  Patient Care Team: Adin Hector, MD as PCP - General (Internal Medicine) Robert Bellow, MD (General Surgery) Clent Jacks, RN as Registered Nurse Hollice Espy, MD as Consulting Physician (Urology) Hessie Knows, MD as Consulting Physician (Orthopedic Surgery)  CHIEF COMPLAINT:  Chief Complaint  Patient presents with  . Follow-up    Oncology History   Impression:   1.cT scan has been reviewed independently sows a complex 3 x 4 cm mass on the right kidney suspicious for renal cancer.  There are no enlarged lymph nodes.  There is small but indeterminate mass in the left kidney. 2.  Previous history of carcinoma breast.  In 1990 patient had lumpectomyin the left breast followed by radiation therapy.  In 1992 lumpectomy and right breast followed by radiation therapy.  In 2014 patient had a local recurrent disease and had lumpectomy on the right breast patient has been started on Femara. By Psychologist, sport and exercise.  Patient has taken tamoxifen after 1992 Patient has multiple kidney mass is in both kidney.  On CT and MRI scan.  I reviewed all the CT scan and MRI scan with the patient and case also was discussed in tumor conference I also discussed situation with Dr. Rayburn Ma as well as her interventional radiologist.  Possibility of either observation versus radiofrequency or cryoablation off small to masses seen in the right and left kidney and watch 1 complex cystic mass had been discussed.  Patient had been referred then to urologist Dr. Rayburn Ma patient   does have T7 compression fracture as well as thyroid nodule which will have to be taken into consideration if we decide to go for    nephrectomy on the right side.       Breast cancer   01/30/2013 Initial Diagnosis Breast cancer    No flowsheet data found.  INTERVAL HISTORY: 81 year old lady came today for  further follow-up.  Patient had a recent MRI scan of the spine done which revealed questionable compression fracture of the T7 spine which very well could be pathological.  This and does not have significant pain in there. Case was also discussed in tumor conference. REVIEW OF SYSTEMS:   Gen. Status: Patient is apprehensive.HEENT: No headache.  No hearing loss.  No ear pain.  No nosebleed or congestion.  No sore throat.  No difficulty swallowing Respiratory system: No cough.  No hemoptysis.  No shortness of breath at rest or exertion.  No chest pain. Musculoskeletal system low back pain Cardiovascular system: No chest pain.  No palpitation.  No paroxysmal nocturnal dyspnea. Gastro intestinal system: No heartburn.  No nausea or vomiting.  No abdominal pain.  No diarrhea.  No constipation.  No rectal bleeding. Skin: No evidence of ecchymosis or rash. -Neurological system: No dizziness.  No tingling.  His was.  no tingling numbness.  no focal weakness or any focal signs. As per HPI. Otherwise, a complete review of systems is negatve.  PAST MEDICAL HISTORY: Past Medical History  Diagnosis Date  . Hemorrhoids   . Arthritis   . 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 .  . Status post radiation therapy     breast cancer bilateral  PAST SURGICAL HISTORY: Past Surgical History  Procedure Laterality Date  . Other surgical history      radiation therapy breast  . Breast lumpectomy Left 1990  . Breast lumpectomy Right 1992  . Breast biopsy Right 2014    ATYPICAL DUCTAL PROLIFERATION WITH MARKED DISTORTION BY    FAMILY HISTORY Family History  Problem Relation Age of Onset  . Stroke Father   . Stomach cancer Mother   . Brain cancer Sister     ADVANCED  DIRECTIVES:  No flowsheet data found.  HEALTH MAINTENANCE: History  Substance Use Topics  . Smoking status: Former Smoker -- 20 years    Types: Cigarettes    Quit date: 04/27/1972  . Smokeless tobacco: Never Used  . Alcohol Use: Yes      Allergies  Allergen Reactions  . Lisinopril Cough  . Morphine And Related Nausea And Vomiting    Current Outpatient Prescriptions  Medication Sig Dispense Refill  . alendronate (FOSAMAX) 70 MG tablet Take by mouth.    Marland Kitchen amLODipine (NORVASC) 2.5 MG tablet Take 2.5 mg by mouth daily.    . Cholecalciferol (VITAMIN D PO) Take by mouth daily.    . hydrochlorothiazide (HYDRODIURIL) 25 MG tablet Take 50 mg by mouth daily.    Marland Kitchen letrozole (FEMARA) 2.5 MG tablet Take 1 tablet (2.5 mg total) by mouth daily. 30 tablet 12  . losartan (COZAAR) 100 MG tablet Take 100 mg by mouth daily.    . metoprolol (LOPRESSOR) 100 MG tablet Take 100 mg by mouth 2 (two) times daily.    . Multiple Vitamins-Minerals (CENTRUM SILVER ULTRA WOMENS PO) Take by mouth daily.    Marland Kitchen omeprazole (PRILOSEC) 20 MG capsule Take 20 mg by mouth daily.    . Calcium-Vitamin D 600-200 MG-UNIT per tablet Take by mouth.    Marland Kitchen HYDROcodone-acetaminophen (NORCO/VICODIN) 5-325 MG per tablet Take 1 tablet by mouth as needed.    Marland Kitchen lisinopril (PRINIVIL,ZESTRIL) 20 MG tablet Take 1 tablet by mouth daily.     No current facility-administered medications for this visit.    OBJECTIVE:  Filed Vitals:   09/26/14 1116  BP: 194/84  Pulse: 58  Temp: 96.2 F (35.7 C)     Body mass index is 31.28 kg/(m^2).    ECOG FS:1 - Symptomatic but completely ambulatory  PHYSICAL EXAM: General  status: Performance status is good.  Patient has not lost significant weight HEENT: No evidence of stomatitis. Sclera and conjunctivae :: No jaundice.   pale looking. Lungs: Air  entry equal on both sides.  No rhonchi.  No rales.  Cardiac: Heart sounds are normal.  No pericardial rub.  No murmur. Lymphatic system: Cervical,  axillary, inguinal, lymph nodes not palpable GI: Abdomen is soft.  No ascites.  Liver spleen not palpable.  No tenderness.  Bowel sounds are within normal limit Lower extremity: No edema Neurological system: Higher functions, cranial nerves intact no evidence of peripheral neuropathy. Skin: No rash.  No ecchymosis.Marland Kitchen   LAB RESULTS:  No visits with results within 2 Day(s) from this visit. Latest known visit with results is:  Hospital Outpatient Visit on 08/08/2014  Component Date Value Ref Range Status  . Creatinine 08/08/2014 0.96   Final   Comment: 0.44-1.00 NOTE: New Reference Range  07/03/14   . EGFR (African American) 08/08/2014 >60   Final  . EGFR (Non-African Amer.) 08/08/2014 56*  Final   Comment: eGFR values <61m/min/1.73 m2 may be an indication of chronic kidney disease (CKD). Calculated eGFR is  useful in patients with stable renal function. The eGFR calculation will not be reliable in acutely ill patients when serum creatinine is changing rapidly. It is not useful in patients on dialysis. The eGFR calculation may not be applicable to patients at the low and high extremes of body sizes, pregnant women, and vegetarians.       STUDIES: Mr Thoracic Spine W Wo Contrast  09/25/2014   CLINICAL DATA:  Back pain. Abnormal bone scan. Remote history of breast cancer. Bilateral renal lesions seen on recent MRI of the abdomen  EXAM: MRI THORACIC SPINE WITHOUT AND WITH CONTRAST  TECHNIQUE: Multiplanar and multiecho pulse sequences of the thoracic spine were obtained without and with intravenous contrast.  CONTRAST:  82m MULTIHANCE GADOBENATE DIMEGLUMINE 529 MG/ML IV SOLN  COMPARISON:  Whole body bone scan 08/10/2014, abdominal/pelvic CT scan 08/01/2014 and chest CT 08/17/2014  FINDINGS: Normal overall alignment of the thoracic vertebral bodies. There is a compression fracture of the T7 vertebral body with low T1 and high T2 signal intensity. There is also diffuse enhancement which  also involves the pedicles. There is also edema and enhancement around the vertebral body. Dural enhancement is also noted.  There is also a subtle superior endplate fracture of T5 which may be more remote.  T1 and T2 lesions appear to be benign hemangiomas. There are indeterminate enhancing lesions in the L1 and L2 vertebral bodies. These could be atypical hemangiomas or metastasis. No significant retropulsion or canal compromise. The thoracic spinal cord demonstrates normal signal intensity. No cord lesions or abnormal cord enhancement.  IMPRESSION: 1. T7 compression fracture is worrisome for a pathologic fracture. Biopsy may be indicated if clinically necessary. 2. Subtle superior endplate fracture of T5 corresponding to the bone scan abnormality. No obvious lesion. 3. Indeterminate enhancing lesions in the L1 and L2 vertebral bodies. 4. Benign-appearing scattered hemangiomas in the thoracic spine. 5. Normal MR appearance of the thoracic spinal cord.   Electronically Signed   By: PMarijo SanesM.D.   On: 09/25/2014 12:17   Mm Diag Breast Tomo Bilateral  09/06/2014   CLINICAL DATA:  Patient history of invasive mammary carcinoma in the upper outer right breast with a DCIS component. This was resected in August 2014. No current complaints.  EXAM: DIGITAL DIAGNOSTIC BILATERAL MAMMOGRAM WITH 3D TOMOSYNTHESIS AND CAD  COMPARISON:  Prior exams  ACR Breast Density Category b: There are scattered areas of fibroglandular density.  FINDINGS: There stable architectural distortion in the upper outer right breast reflecting the postsurgical scarring. Single vascular clip is noted in the right axillary region. There are no discrete masses, other areas of architectural distortion or suspicious calcifications.  Mammographic images were processed with CAD.  IMPRESSION: No evidence of recurrent or new breast malignancy. Benign postsurgical changes on the right.  RECOMMENDATION: Diagnostic mammography in 1 year per standard post  lumpectomy protocol.  I have discussed the findings and recommendations with the patient. Results were also provided in writing at the conclusion of the visit. If applicable, a reminder letter will be sent to the patient regarding the next appointment.  BI-RADS CATEGORY  2: Benign.   Electronically Signed   By: DLajean ManesM.D.   On: 09/06/2014 13:54    ASSESSMENT: 1.CT scan of abdomen and MRI scan of abdomen is suggestive of multifocal kidney masses.  More significant on the right side.  Right partial nephrectomy was planned. 2.previous history of carcinoma of breast  Recurrent disease in 2014 3.  Bone scan and MRI scan  is abnormal particularly in T7 spine pathological fracture cannot be ruled out  MEDICAL DECISION MAKING:  This was discussed in tumor conference I also discussed casewith Dr. Kyla Balzarine Tumor board recommendation to proceed with kyphoplasty at T7 spine which also can give US biopsy. This was discussed after visit with patient was extremely apprehensive.  The need for biopsy prior to kidney surgery was explained  Patient expressed understanding and was in agreement with this plan. She also understands that She can call clinic at any time with any questions, concerns, or complaints.    No matching staging information was found for the patient.  Forest Gleason, MD   09/28/2014 10:25 AM

## 2014-10-03 ENCOUNTER — Other Ambulatory Visit: Payer: PPO

## 2014-10-03 ENCOUNTER — Telehealth: Payer: Self-pay | Admitting: Urology

## 2014-10-03 NOTE — Telephone Encounter (Signed)
81 year old patient with history of breast cancer, multifocal bilateral renal masses, and T7 spine  fracture each appears to be possibly pathologic and metastatic disease cannot be ruled out.  Her case was previously discussed in tumor board.  Given the concern for possible metastatic disease, she is undergoing kyphoplasty with Dr. Kyla Balzarine at which time a biopsy can be obtained.  Discussed on the phone today with the patient that I'm hesitant to go ahead and proceed with nephrectomy at this time as I do have concern that the lesions in question may represent metastatic disease (although only few cases descriptions of breast cancer metastatic to the kidneys have been described) but given her age and other comorbidities, I would prefer to be sure prior to subjecting her to the risks of surgery. We discussed proceeding with a renal biopsy to ensure and confirm the diagnosis. We discussed the risks of the procedure including risk of bleeding, infection, complications from sedation/anesthesia, pain, and a theoretical risk of tumor seeding although in reality this risk is extremely low.  She is agreeable to proceed with renal biopsy. We'll likely recommend biopsy of the largest renal mass.    Order for biopsy placed today, will have my office staff arrange.  Will defer surgery at this time until kyphoplasty and renal biopsy complete.    Hollice Espy, MD

## 2014-10-04 ENCOUNTER — Inpatient Hospital Stay: Admission: RE | Admit: 2014-10-04 | Payer: PPO | Source: Ambulatory Visit

## 2014-10-04 NOTE — Telephone Encounter (Signed)
Spoke with the patient after speaking with Dr. Erlene Quan and we have decided to wait on the renal biopsy until after the spinal biopsy results are back. Patient is in agreement with this plan. Kristi from the [facility] was notified of this and will be contacting us with the date of the spinal biopsy and will have the results forwarded to [place] so that we can determine how to proceed/SW

## 2014-10-08 ENCOUNTER — Encounter
Admission: RE | Admit: 2014-10-08 | Discharge: 2014-10-08 | Disposition: A | Discharge status: Home / Self Care | Payer: PPO | Source: Ambulatory Visit | Attending: Orthopedic Surgery | Admitting: Orthopedic Surgery

## 2014-10-08 DIAGNOSIS — M8580 Other specified disorders of bone density and structure, unspecified site: Secondary | ICD-10-CM | POA: Diagnosis not present

## 2014-10-08 DIAGNOSIS — I10 Essential (primary) hypertension: Secondary | ICD-10-CM | POA: Diagnosis not present

## 2014-10-08 DIAGNOSIS — Z9889 Other specified postprocedural states: Secondary | ICD-10-CM | POA: Diagnosis not present

## 2014-10-08 DIAGNOSIS — Z853 Personal history of malignant neoplasm of breast: Secondary | ICD-10-CM | POA: Diagnosis not present

## 2014-10-08 DIAGNOSIS — Z79899 Other long term (current) drug therapy: Secondary | ICD-10-CM | POA: Diagnosis not present

## 2014-10-08 DIAGNOSIS — K219 Gastro-esophageal reflux disease without esophagitis: Secondary | ICD-10-CM | POA: Diagnosis not present

## 2014-10-08 DIAGNOSIS — Z808 Family history of malignant neoplasm of other organs or systems: Secondary | ICD-10-CM | POA: Diagnosis not present

## 2014-10-08 DIAGNOSIS — M4854XA Collapsed vertebra, not elsewhere classified, thoracic region, initial encounter for fracture: Secondary | ICD-10-CM | POA: Diagnosis present

## 2014-10-08 DIAGNOSIS — Z823 Family history of stroke: Secondary | ICD-10-CM | POA: Diagnosis not present

## 2014-10-08 DIAGNOSIS — E785 Hyperlipidemia, unspecified: Secondary | ICD-10-CM | POA: Diagnosis not present

## 2014-10-08 DIAGNOSIS — Z800 Family history of malignant neoplasm of digestive organs: Secondary | ICD-10-CM | POA: Diagnosis not present

## 2014-10-08 DIAGNOSIS — Z888 Allergy status to other drugs, medicaments and biological substances status: Secondary | ICD-10-CM | POA: Diagnosis not present

## 2014-10-08 HISTORY — DX: Chronic kidney disease, unspecified: N18.9

## 2014-10-08 HISTORY — DX: Essential (primary) hypertension: I10

## 2014-10-08 HISTORY — DX: Gastro-esophageal reflux disease without esophagitis: K21.9

## 2014-10-08 LAB — POTASSIUM: Potassium: 3.8 mmol/L (ref 3.5–5.1)

## 2014-10-08 NOTE — Patient Instructions (Signed)
  Your procedure is scheduled on: June 14, 2016Report to Day Surgery. To find out your arrival time please call [phone] between 1PM - 3PM on June 13. 2016.  Remember: Instructions that are not followed completely may result in serious medical risk, up to and including death, or upon the discretion of your surgeon and anesthesiologist your surgery may need to be rescheduled.    ___x_ 1. Do not eat food or drink liquids after midnight. No gum chewing or hard candies.     ___x 2. No Alcohol for 24 hours before or after surgery.   ____ 3. Bring all medications with you on the day of surgery if instructed.    __x__ 4. Notify your doctor if there is any change in your medical condition     (cold, fever, infections).     Do not wear jewelry, make-up, hairpins, clips or nail polish.  Do not wear lotions, powders, or perfumes. You may wear deodorant.  Do not shave 48 hours prior to surgery. Men may shave face and neck.  Do not bring valuables to the hospital.    [facility] is not responsible for any belongings or valuables.               Contacts, dentures or bridgework may not be worn into surgery.  Leave your suitcase in the car. After surgery it may be brought to your room.  For patients admitted to the hospital, discharge time is determined by your                treatment team.   Patients discharged the day of surgery will not be allowed to drive home.   Please read over the following fact sheets that you were given:   Surgical Site Infection Prevention   ____ Take these medicines the morning of surgery with A SIP OF WATER:    1. Amlodipine  2. Losartan  3. Metoprolol  4.Omeprazole  5.  6.  ____ Fleet Enema (as directed)   __x__ Use CHG Soap as directed  ____ Use inhalers on the day of surgery  ____ Stop metformin 2 days prior to surgery    ____ Take 1/2 of usual insulin dose the night before surgery and none on the morning of surgery.   ____ Stop  Coumadin/Plavix/aspirin on   ____ Stop Anti-inflammatories on    _x__ Stop supplements until after surgery.    ____ Bring C-Pap to the hospital.

## 2014-10-09 ENCOUNTER — Ambulatory Visit: Payer: PPO

## 2014-10-09 ENCOUNTER — Encounter: Payer: Self-pay | Admitting: Anesthesiology

## 2014-10-09 ENCOUNTER — Ambulatory Visit: Payer: PPO | Admitting: Anesthesiology

## 2014-10-09 ENCOUNTER — Encounter
Admission: RE | Disposition: A | Discharge status: Home / Self Care | Payer: Self-pay | Source: Ambulatory Visit | Attending: Orthopedic Surgery

## 2014-10-09 ENCOUNTER — Ambulatory Visit
Admission: RE | Admit: 2014-10-09 | Discharge: 2014-10-09 | Disposition: A | Discharge status: Home / Self Care | Payer: PPO | Source: Ambulatory Visit | Attending: Orthopedic Surgery | Admitting: Orthopedic Surgery

## 2014-10-09 DIAGNOSIS — K219 Gastro-esophageal reflux disease without esophagitis: Secondary | ICD-10-CM | POA: Insufficient documentation

## 2014-10-09 DIAGNOSIS — Z419 Encounter for procedure for purposes other than remedying health state, unspecified: Secondary | ICD-10-CM

## 2014-10-09 DIAGNOSIS — Z79899 Other long term (current) drug therapy: Secondary | ICD-10-CM | POA: Insufficient documentation

## 2014-10-09 DIAGNOSIS — Z9889 Other specified postprocedural states: Secondary | ICD-10-CM | POA: Insufficient documentation

## 2014-10-09 DIAGNOSIS — I10 Essential (primary) hypertension: Secondary | ICD-10-CM | POA: Insufficient documentation

## 2014-10-09 DIAGNOSIS — E785 Hyperlipidemia, unspecified: Secondary | ICD-10-CM | POA: Insufficient documentation

## 2014-10-09 DIAGNOSIS — Z800 Family history of malignant neoplasm of digestive organs: Secondary | ICD-10-CM | POA: Insufficient documentation

## 2014-10-09 DIAGNOSIS — Z808 Family history of malignant neoplasm of other organs or systems: Secondary | ICD-10-CM | POA: Insufficient documentation

## 2014-10-09 DIAGNOSIS — M4854XA Collapsed vertebra, not elsewhere classified, thoracic region, initial encounter for fracture: Secondary | ICD-10-CM | POA: Diagnosis not present

## 2014-10-09 DIAGNOSIS — Z823 Family history of stroke: Secondary | ICD-10-CM | POA: Insufficient documentation

## 2014-10-09 DIAGNOSIS — Z853 Personal history of malignant neoplasm of breast: Secondary | ICD-10-CM | POA: Insufficient documentation

## 2014-10-09 DIAGNOSIS — M8580 Other specified disorders of bone density and structure, unspecified site: Secondary | ICD-10-CM | POA: Insufficient documentation

## 2014-10-09 DIAGNOSIS — Z888 Allergy status to other drugs, medicaments and biological substances status: Secondary | ICD-10-CM | POA: Insufficient documentation

## 2014-10-09 HISTORY — PX: KYPHOPLASTY: SHX5884

## 2014-10-09 SURGERY — KYPHOPLASTY
Anesthesia: Monitor Anesthesia Care | Site: Spine Thoracic | Wound class: Clean

## 2014-10-09 MED ORDER — SODIUM CHLORIDE 0.9 % IV SOLN: INTRAVENOUS | Status: DC

## 2014-10-09 MED ORDER — ONDANSETRON HCL 4 MG/2ML IJ SOLN
4.0000 mg | Freq: Four times a day (QID) | INTRAMUSCULAR | Status: DC | PRN
Start: 2014-10-09 — End: 2014-10-09

## 2014-10-09 MED ORDER — BUPIVACAINE-EPINEPHRINE (PF) 0.5% -1:200000 IJ SOLN
INTRAMUSCULAR | Status: DC | PRN
  Administered 2014-10-09: 15 mL via PERINEURAL

## 2014-10-09 MED ORDER — CEFAZOLIN SODIUM-DEXTROSE 2-3 GM-% IV SOLR
INTRAVENOUS | Status: AC
  Filled 2014-10-09: qty 50

## 2014-10-09 MED ORDER — LACTATED RINGERS IV SOLN
INTRAVENOUS | Status: DC
  Administered 2014-10-09: 14:00:00 via INTRAVENOUS

## 2014-10-09 MED ORDER — FENTANYL CITRATE (PF) 100 MCG/2ML IJ SOLN
INTRAMUSCULAR | Status: DC | PRN
  Administered 2014-10-09 (×2): 50 ug via INTRAVENOUS

## 2014-10-09 MED ORDER — LIDOCAINE HCL (PF) 1 % IJ SOLN
INTRAMUSCULAR | Status: AC
  Filled 2014-10-09: qty 60

## 2014-10-09 MED ORDER — LIDOCAINE HCL 1 % IJ SOLN: INTRAMUSCULAR | Status: DC | PRN

## 2014-10-09 MED ORDER — CEFAZOLIN SODIUM-DEXTROSE 2-3 GM-% IV SOLR
2.0000 g | Freq: Once | INTRAVENOUS | Status: AC
  Administered 2014-10-09: 2 g via INTRAVENOUS

## 2014-10-09 MED ORDER — IOHEXOL 180 MG/ML  SOLN
INTRAMUSCULAR | Status: AC
  Filled 2014-10-09: qty 40

## 2014-10-09 MED ORDER — LIDOCAINE HCL (CARDIAC) 20 MG/ML IV SOLN
INTRAVENOUS | Status: DC | PRN
  Administered 2014-10-09: 50 mg via INTRAVENOUS

## 2014-10-09 MED ORDER — ONDANSETRON HCL 4 MG/2ML IJ SOLN: 4.0000 mg | Freq: Once | INTRAMUSCULAR | Status: DC | PRN

## 2014-10-09 MED ORDER — PROPOFOL INFUSION 10 MG/ML OPTIME
INTRAVENOUS | Status: DC | PRN
  Administered 2014-10-09: 50 ug/kg/min via INTRAVENOUS

## 2014-10-09 MED ORDER — FENTANYL CITRATE (PF) 100 MCG/2ML IJ SOLN: 25.0000 ug | INTRAMUSCULAR | Status: DC | PRN

## 2014-10-09 MED ORDER — HYDROCODONE-ACETAMINOPHEN 5-325 MG PO TABS: 1.0000 | ORAL_TABLET | Freq: Four times a day (QID) | ORAL | Status: DC | PRN

## 2014-10-09 MED ORDER — HYDROCODONE-ACETAMINOPHEN 5-325 MG PO TABS: 1.0000 | ORAL_TABLET | ORAL | Status: DC | PRN

## 2014-10-09 MED ORDER — BUPIVACAINE-EPINEPHRINE (PF) 0.5% -1:200000 IJ SOLN
INTRAMUSCULAR | Status: AC
  Filled 2014-10-09: qty 30

## 2014-10-09 MED ORDER — ONDANSETRON HCL 4 MG PO TABS: 4.0000 mg | ORAL_TABLET | Freq: Four times a day (QID) | ORAL | Status: DC | PRN

## 2014-10-09 MED ORDER — IOPAMIDOL (ISOVUE-300) INJECTION 61%
INTRAVENOUS | Status: DC | PRN
  Administered 2014-10-09: 40 mL via INTRAVENOUS

## 2014-10-09 SURGICAL SUPPLY — 15 items
CEMENT KYPHON CX01A KIT/MIXER (Cement) ×3 IMPLANT
DEVICE BIOPSY BONE KYPH ×3 IMPLANT
DEVICE BIOPSY BONE KYPHX (INSTRUMENTS) ×3 IMPLANT
DRAPE C-ARM XRAY 36X54 (DRAPES) ×3 IMPLANT
DURAPREP 26ML APPLICATOR (WOUND CARE) ×3 IMPLANT
GLOVE SURG CUT RESIS 520032000 (GLOVE) ×3 IMPLANT
GLOVE SURG ORTHO 9.0 STRL STRW (GLOVE) ×3 IMPLANT
GOWN SPECIALTY ULTRA XL (MISCELLANEOUS) ×3 IMPLANT
GOWN STRL REUS W/ TWL LRG LVL3 (GOWN DISPOSABLE) ×1 IMPLANT
GOWN STRL REUS W/TWL LRG LVL3 (GOWN DISPOSABLE) ×2
LIQUID BAND (GAUZE/BANDAGES/DRESSINGS) ×3 IMPLANT
NDL SAFETY 18GX1.5 (NEEDLE) ×3 IMPLANT
PACK KYPHOPLASTY (MISCELLANEOUS) ×3 IMPLANT
STRAP SAFETY BODY (MISCELLANEOUS) ×3 IMPLANT
TRAY KYPHOPAK 15/2 EXPRESS (KITS) ×3 IMPLANT

## 2014-10-09 NOTE — Discharge Instructions (Addendum)
AMBULATORY SURGERY  DISCHARGE INSTRUCTIONS   1) The drugs that you were given will stay in your system until tomorrow so for the next 24 hours you should not:  Drive an automobilePercutaneous Vertebroplasty, Care After These instructions give you information on caring for yourself after your procedure. Your doctor may also give you more specific instructions. Call your doctor if you have any problems or questions after your procedure. HOME CARE Take medicine as told by your doctor. Keep your wound dry and covered for 24 hours or as told by your doctor. Ask your doctor when you can bathe or shower. Put an ice pack on your wound. Put ice in a plastic bag. Place a towel between your skin and the bag. Leave the ice on for 15-20 minutes, 3-4 times a day. Rest in your bed for 24 hours or as told by your doctor. Return to normal activities as told by your doctor. Ask your doctor what stretches and exercises you can do. Do not bend or lift anything heavy as told by your doctor. GET HELP IF: Your wound becomes red, puffy (swollen), or tender to the touch. You are bleeding or leaking fluid from the wound. You are sick to your stomach (nauseous) or throw up (vomit) for more than 24 hours after the procedure. Your back pain does not get better. You have a fever. GET HELP RIGHT AWAY IF:  You have bad back pain that comes on suddenly. You cannot control when you pee (urinate) or poop (bowel movement). You lose feeling (numbness) or have tingling in your legs or feet, or they become weak. You have sudden weakness in your arms or legs. You have shooting pain down your legs. You have chest pain or a hard time breathing. You feel dizzy or pass out (faint). Your vision changes or you cannot talk as you normally do. Document Released: 07/08/2009 Document Revised: 04/18/2013 Document Reviewed: 12/20/2012 [facility] Patient Information 2015 [place], [place]. This information is not intended to replace  advice given to you by your health care provider. Make sure you discuss any questions you have with your health care provider. A)  B) Make any legal decisions C) Drink any alcoholic beverage   2) You may resume regular meals tomorrow.  Today it is better to start with liquids and gradually work up to solid foods.  You may eat anything you prefer, but it is better to start with liquids, then soup and crackers, and gradually work up to solid foods.   3) Please notify your doctor immediately if you have any unusual bleeding, trouble breathing, redness and pain at the surgery site, drainage, fever, or pain not relieved by medication. 4)   5) Your post-operative visit with Dr.                                     is: Date:                        Time:    Please call to schedule your post-operative visit.  6) Additional Instructions:  no strenuous activity today. Tomorrow activities as tolerated. Remove Band-Aid Thursday and okay to shower following that

## 2014-10-09 NOTE — Op Note (Signed)
10/09/2014  3:40 PM  PATIENT:  Tara Ramos  81 y.o. female  PRE-OPERATIVE DIAGNOSIS:  T7 compression fracture  POST-OPERATIVE DIAGNOSIS:  T7 compression fracture  PROCEDURE:  Procedure(s) with comments: KYPHOPLASTY (N/A) - T7 Kyphoplasty with bone biopsy  SURGEON: Laurene Footman, MD  ASSISTANTS: None  ANESTHESIA:   MAC  EBL:  Total I/O In: 600 [I.V.:600] Out: -   BLOOD ADMINISTERED:none  DRAINS: none   LOCAL MEDICATIONS USED:  MARCAINE   , XYLOCAINE  and Amount: 20 ml  SPECIMEN:  Source of Specimen:  T7 vertebral body  DISPOSITION OF SPECIMEN:  PATHOLOGY  COUNTS:  YES  TOURNIQUET:  * No tourniquets in log *  IMPLANTBONE CEMENT DICTATION: .Dragon Dictation patient was brought to the operating room and after adequate sedation was given, the patient was placed prone. C-arm was brought in and good visualization of T7 could be obtained. Following this the local mass effect was given in the area of the planned incisions after appropriate patient identification and timeout procedure. Next the back was prepped and draped in sterile fashion and repeat timeout procedure completed. Local metastatic was infiltrated down to the pedicle of T7 on the right side a small incision made and trocar advanced into the vertebral body with care being taken to visualize it on both AP and lateral projections ensure that the neural foramen or spinal canal were not violated. The bone was quite dense and is difficult to advance the cannula. Biopsy was obtained with multiple passes with the biopsy trocar and adequate specimen was felt to be obtained that was representative. Next a balloon was inserted after drilling and the bone was quite dense and there is minimal inflation of the balloon. Approximately 2 cc of bone cement was infiltrated and this filled in the area of the fracture. After obtaining AP lateral images of T7 with the bone cement in place, trochars having been removed Dermabond was used to  close the skin incision followed by a Band-Aid.  .AN OF CARE: Discharge to home after PACU  PATIENT DISPOSITION:  PACU - hemodynamically stable.

## 2014-10-09 NOTE — Anesthesia Preprocedure Evaluation (Signed)
Anesthesia Evaluation  Patient identified by MRN, date of birth, ID band Patient awake    Reviewed: Allergy & Precautions, NPO status , reviewed documented beta blocker date and time   Airway Mallampati: II  TM Distance: >3 FB     Dental  (+) Upper Dentures, Lower Dentures   Pulmonary former smoker,          Cardiovascular hypertension,     Neuro/Psych    GI/Hepatic   Endo/Other    Renal/GU      Musculoskeletal  (+) Arthritis -,   Abdominal   Peds  Hematology   Anesthesia Other Findings   Reproductive/Obstetrics                             Anesthesia Physical Anesthesia Plan  ASA: III  Anesthesia Plan: MAC   Post-op Pain Management:    Induction:   Airway Management Planned: Nasal Cannula  Additional Equipment:   Intra-op Plan:   Post-operative Plan:   Informed Consent: I have reviewed the patients History and Physical, chart, labs and discussed the procedure including the risks, benefits and alternatives for the proposed anesthesia with the patient or authorized representative who has indicated his/her understanding and acceptance.     Plan Discussed with: CRNA  Anesthesia Plan Comments:         Anesthesia Quick Evaluation

## 2014-10-09 NOTE — Transfer of Care (Signed)
Immediate Anesthesia Transfer of Care Note  Patient: Tara Ramos  Procedure(s) Performed: Procedure(s) with comments: KYPHOPLASTY (N/A) - T7 Kyphoplasty with bone biopsy  Patient Location: PACU  Anesthesia Type:General  Level of Consciousness: awake and patient cooperative  Airway & Oxygen Therapy: Patient Spontanous Breathing and Patient connected to nasal cannula oxygen  Post-op Assessment: Report given to RN  Post vital signs: Reviewed and stable  Last Vitals:  Filed Vitals:   10/09/14 1545  BP: 184/84  Pulse: 53  Temp: 37.3 C  Resp: 16    Complications: No apparent anesthesia complications

## 2014-10-09 NOTE — H&P (Signed)
Reviewed paper H+P, will be scanned into chart. No changes noted.  

## 2014-10-10 ENCOUNTER — Encounter: Payer: Self-pay | Admitting: Orthopedic Surgery

## 2014-10-10 NOTE — Anesthesia Postprocedure Evaluation (Signed)
  Anesthesia Post-op Note  Patient: Tara Ramos  Procedure(s) Performed: Procedure(s) with comments: KYPHOPLASTY (N/A) - T7 Kyphoplasty with bone biopsy  Anesthesia type:MAC, General  Patient location: PACU  Post pain: Pain level controlled  Post assessment: Post-op Vital signs reviewed, Patient's Cardiovascular Status Stable, Respiratory Function Stable, Patent Airway and No signs of Nausea or vomiting  Post vital signs: Reviewed and stable  Last Vitals:  Filed Vitals:   10/09/14 1633  BP: 169/78  Pulse: 49  Temp: 37.1 C  Resp: 16    Level of consciousness: awake, alert  and patient cooperative  Complications: No apparent anesthesia complications

## 2014-10-11 LAB — SURGICAL PATHOLOGY

## 2014-10-16 ENCOUNTER — Encounter: Admission: RE | Payer: Self-pay | Source: Ambulatory Visit

## 2014-10-16 ENCOUNTER — Inpatient Hospital Stay: Admission: RE | Admit: 2014-10-16 | Payer: PPO | Source: Ambulatory Visit | Admitting: Urology

## 2014-10-16 SURGERY — NEPHRECTOMY, HAND-ASSISTED, LAPAROSCOPIC
Anesthesia: Choice | Laterality: Right

## 2014-10-21 ENCOUNTER — Telehealth: Payer: Self-pay | Admitting: Urology

## 2014-10-21 NOTE — Telephone Encounter (Signed)
Aware of pathology from kyphoplasty NEGATIVE for malignancy.  I do want to precede with biopsy of right largest renal mass prior to removing right kidney in an 81 year old to be 100% sure of the dx as multifocal bilateral lesions are somewhat rare. I would like her to follow up in our clinic after the biopsy to review results/ plan.     We have previously discussed biopsy of the mass over the phone but if she would like further details or to review procedure again, let me know.    Also, since I will likely be out on maternity leave when it comes time for surgery, if she wants to go ahead and see one of the [facility] in [place], let me know and I can help arrange.    Hollice Espy, MD

## 2014-10-22 ENCOUNTER — Telehealth: Payer: Self-pay

## 2014-10-22 NOTE — Telephone Encounter (Signed)
Spoke with Tara Ramos in [place] in regards to scheduling for Renal biopsy she will speak with the radiologist and call back with a date and time

## 2014-10-22 NOTE — Telephone Encounter (Signed)
Oncology Nurse Navigator Documentation  Oncology Nurse Navigator Flowsheets 09/12/2014 09/18/2014 10/22/2014  Navigator Encounter Type Telephone Telephone Telephone  Time Spent with Patient 15 30 15    Received call from Tara Ramos. She had not received results from [facility]. Results are Negative for malignancy,and these were given to her. Also told her that Dr Erlene Quan was ready to proceed with kidney biopsy and that her office should be in contact to arrange. She was in agreement.

## 2014-10-22 NOTE — Telephone Encounter (Signed)
Juanita called back and gave a date and time of 10-31-14@7 :30am. She wanted to know if you would like the patient to stay and admit her overnight for observation, due to the risk of bleeding? I have spoke with Mrs. Dierolf and notified her of the test date and time and she is ok with this. Per [place] she will need and updated H&P and I just went ahead and booked Mrs. Lineberry for a apt with you on Wednesday 10-24-14@4pm . Patient is aware of this apt as well and I explained to her that we would let her know then if we would be keeping her over night or not but this may be a possibility/SW

## 2014-10-22 NOTE — Telephone Encounter (Signed)
Awesome!  We can plan to just keep her overnight for obs as a precaution.  Saw her on the scheduled for Wed.  Good plan.  Hollice Espy, MD

## 2014-10-24 ENCOUNTER — Encounter: Payer: Self-pay | Admitting: Urology

## 2014-10-24 ENCOUNTER — Ambulatory Visit (INDEPENDENT_AMBULATORY_CARE_PROVIDER_SITE_OTHER): Payer: PPO | Admitting: Urology

## 2014-10-24 VITALS — BP 158/81 | HR 60 | Ht 62.0 in | Wt 171.4 lb

## 2014-10-24 DIAGNOSIS — N2889 Other specified disorders of kidney and ureter: Secondary | ICD-10-CM

## 2014-10-24 NOTE — Progress Notes (Signed)
10/24/2014 5:48 PM   [name] 25-Feb-1932 [id number]  Referring provider: Adin Hector, MD [place], [place] [address]  Chief Complaint  Patient presents with  . Pre-op Exam    Renal biopsy. Surgery date 10/31/14 @7 :30am    HPI: 81 year old relatively healthy female who presented to the emergency room with severe constipation underwent CT abdomen and pelvis with contrast showing multiple bilateral renal masses.  She underwent an MRI to confirm tthe size and location of these lesions. She was noted to have a 4.5 x 2.8 x 2.2 cm RIGHT medial  enhancing renal mass along with a 1.6 cm enhancing inferior pole renal mass. On the LEFT, she was found to have a 1.9 cm LEFT enhancing renal mass as well.  She does have a history of breast cancer, first diagnosed in 1990 status post lumpectomy and radiation. She developed recurrent disease in the same breast in 2014 at which time she underwent lumpectomy and no other therapy needed.  CT of the chest 08/17/14 showed no evidence for lung metastases but an incidental T7 compression fracture concerning was noted of which metastatic involvement not excluded. Bone scan was also somewhat concerning for pathological fracture. She most recently underwent a kyphoplasty with biopsy with was NEGATIVE for malignancy. She was also noted to have an incidental 11 mm left thyroid nodule.  Creatinine on 07/2014 0.96.   No previous abdominal surgeries.   She presents returns today discuss management of her renal masses and recommended renal biopsy prior to nephroectomy.   PMH: Past Medical History  Diagnosis Date  . Hemorrhoids   . Arthritis   . 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 .  . Status post  radiation therapy     breast cancer bilateral  . GERD (gastroesophageal reflux disease)   . Hypertension   . Chronic kidney disease     Surgical History: Past Surgical History  Procedure Laterality Date  . Other surgical history      radiation therapy breast  . Breast lumpectomy Left 1990  . Breast lumpectomy Right 1992  . Breast biopsy Right 2014    ATYPICAL DUCTAL PROLIFERATION WITH MARKED DISTORTION BY  . Eye surgery Bilateral     cataract extraction  . Dilation and curettage of uterus    . Kyphoplasty N/A 10/09/2014    Procedure: KYPHOPLASTY;  Surgeon: Hessie Knows, MD;  Location: [facility] ORS;  Service: Orthopedics;  Laterality: N/A;  T7 Kyphoplasty with bone biopsy    Home Medications:    Medication List       This list is accurate as of: 10/24/14  5:48 PM.  Always use your most recent med list.               amLODipine 2.5 MG tablet  Commonly known as:  NORVASC  Take 5 mg by mouth daily.     Calcium-Vitamin D 600-200 MG-UNIT per tablet  Take 1 tablet by mouth daily.     CENTRUM SILVER ULTRA WOMENS PO  Take 1 tablet by mouth daily.     hydrochlorothiazide 25 MG tablet  Commonly known as:  HYDRODIURIL  Take 50 mg by mouth daily.     HYDROcodone-acetaminophen 5-325 MG per tablet  Commonly known as:  NORCO  Take 1-2 tablets by  mouth every 6 (six) hours as needed for moderate pain.     letrozole 2.5 MG tablet  Commonly known as:  FEMARA  Take 1 tablet (2.5 mg total) by mouth daily.     losartan 100 MG tablet  Commonly known as:  COZAAR  Take 50 mg by mouth daily.     metoprolol 100 MG tablet  Commonly known as:  LOPRESSOR  Take 100 mg by mouth 2 (two) times daily.     omeprazole 20 MG capsule  Commonly known as:  PRILOSEC  Take 20 mg by mouth daily.     VITAMIN D PO  Take 1,000 Units by mouth daily.        Allergies:  Allergies  Allergen Reactions  . Lisinopril Cough  . Morphine And Related Nausea And Vomiting    Family History: Family  History  Problem Relation Age of Onset  . Stroke Father   . Stomach cancer Mother   . Brain cancer Sister     Social History:  reports that she quit smoking about 42 years ago. Her smoking use included Cigarettes. She quit after 20 years of use. She has never used smokeless tobacco. She reports that she drinks about 0.6 oz of alcohol per week. She reports that she does not use illicit drugs.   Physical Exam: BP 158/81 mmHg  Pulse 60  Ht 5' 2"  (1.575 m)  Wt 171 lb 6.4 oz (77.747 kg)  BMI 31.34 kg/m2  Constitutional:  Alert and oriented, No acute distress. HEENT: [place] AT, moist mucus membranes.  Trachea midline, no masses. Cardiovascular: No clubbing, cyanosis, or edema. Respiratory: Normal respiratory effort, no increased work of breathing. GI: Abdomen is soft, nontender, nondistended, no abdominal masses GU: No CVA tenderness.  Skin: No rashes, bruises or suspicious lesions. Neurologic: Grossly intact, no focal deficits, moving all 4 extremities. Psychiatric: Normal mood and affect.  Laboratory Data: Lab Results  Component Value Date   WBC 15.5* 08/01/2014   HGB 16.3* 08/01/2014   HCT 50.5* 08/01/2014   MCV 86 08/01/2014   PLT 398 08/01/2014    Lab Results  Component Value Date   CREATININE 0.96 08/08/2014   Bone biopsy 10/09/14 DIAGNOSIS:  A. BONE, T7 VERTEBRA; BIOPSY:  - NEW BONE FORMATION AND MARROW FIBROSIS CONSISTENT WITH FRACTURE.  - NEGATIVE FOR MALIGNANCY.   Assessment & Plan: 81 year old female with right renal mass 2 and left renal mass. She is now status post lap see of a T7 compression fracture which is negative for malignancy. At her multifocal renal masses, is some concern that his lesions may not represent renal cell carcinoma. As such, I do think it is prudent to proceed with biopsy of the largest right upper pole mass to confirm the diagnosis prior to surgical intervention. If the pathology is consistent with renal cell carcinoma, recommend either  laparoscopic right radical nephrectomy versus partial nephrectomy 2 along with either continued surveillance of the left renal mass versus cryotherapy of this lesion. Patient is agreeable with this plan. We discussed the importance of sparing as much nephrons as possible given her multifocal disease. We discussed the risks of kidney biopsy today primarily risk of bleeding and plan for overnight observation following this procedure.  There is also a theoretical risk of tumor seeding, although, this is extremely low risk.  1. Right renal mass x 2 -schedule IR biopsy of largest right upper pole renal mass -will call to discuss results and arrange follow up plan  2. Left renal mass  x 1 -will either follow this lesion vs. Consider percutaneous intervention once right sided tumors treated with pathology c/w RCC   Hollice Espy, MD  [facility] [address], [place], [place] [address] [phone]

## 2014-10-31 ENCOUNTER — Other Ambulatory Visit: Payer: Self-pay | Admitting: Urology

## 2014-10-31 ENCOUNTER — Observation Stay
Admission: AD | Admit: 2014-10-31 | Discharge: 2014-11-01 | Disposition: A | Discharge status: Home / Self Care | Payer: PPO | Source: Ambulatory Visit | Attending: Urology | Admitting: Urology

## 2014-10-31 ENCOUNTER — Encounter: Payer: Self-pay | Admitting: *Deleted

## 2014-10-31 ENCOUNTER — Ambulatory Visit
Admission: RE | Admit: 2014-10-31 | Discharge: 2014-10-31 | Disposition: A | Discharge status: Home / Self Care | Payer: PPO | Source: Ambulatory Visit | Attending: Urology | Admitting: Urology

## 2014-10-31 DIAGNOSIS — Z87891 Personal history of nicotine dependence: Secondary | ICD-10-CM | POA: Diagnosis not present

## 2014-10-31 DIAGNOSIS — Z79891 Long term (current) use of opiate analgesic: Secondary | ICD-10-CM | POA: Diagnosis not present

## 2014-10-31 DIAGNOSIS — N280 Ischemia and infarction of kidney: Secondary | ICD-10-CM | POA: Insufficient documentation

## 2014-10-31 DIAGNOSIS — M8580 Other specified disorders of bone density and structure, unspecified site: Secondary | ICD-10-CM | POA: Diagnosis not present

## 2014-10-31 DIAGNOSIS — I129 Hypertensive chronic kidney disease with stage 1 through stage 4 chronic kidney disease, or unspecified chronic kidney disease: Secondary | ICD-10-CM | POA: Insufficient documentation

## 2014-10-31 DIAGNOSIS — C7901 Secondary malignant neoplasm of right kidney and renal pelvis: Secondary | ICD-10-CM | POA: Diagnosis present

## 2014-10-31 DIAGNOSIS — Z885 Allergy status to narcotic agent status: Secondary | ICD-10-CM | POA: Diagnosis not present

## 2014-10-31 DIAGNOSIS — Z888 Allergy status to other drugs, medicaments and biological substances status: Secondary | ICD-10-CM | POA: Diagnosis not present

## 2014-10-31 DIAGNOSIS — N189 Chronic kidney disease, unspecified: Secondary | ICD-10-CM | POA: Diagnosis not present

## 2014-10-31 DIAGNOSIS — C641 Malignant neoplasm of right kidney, except renal pelvis: Secondary | ICD-10-CM | POA: Diagnosis not present

## 2014-10-31 DIAGNOSIS — N2889 Other specified disorders of kidney and ureter: Secondary | ICD-10-CM

## 2014-10-31 DIAGNOSIS — E785 Hyperlipidemia, unspecified: Secondary | ICD-10-CM | POA: Insufficient documentation

## 2014-10-31 DIAGNOSIS — Z9889 Other specified postprocedural states: Secondary | ICD-10-CM | POA: Insufficient documentation

## 2014-10-31 DIAGNOSIS — Z9849 Cataract extraction status, unspecified eye: Secondary | ICD-10-CM | POA: Insufficient documentation

## 2014-10-31 DIAGNOSIS — Z823 Family history of stroke: Secondary | ICD-10-CM | POA: Insufficient documentation

## 2014-10-31 DIAGNOSIS — Z853 Personal history of malignant neoplasm of breast: Secondary | ICD-10-CM | POA: Diagnosis not present

## 2014-10-31 DIAGNOSIS — K219 Gastro-esophageal reflux disease without esophagitis: Secondary | ICD-10-CM | POA: Insufficient documentation

## 2014-10-31 DIAGNOSIS — Z800 Family history of malignant neoplasm of digestive organs: Secondary | ICD-10-CM | POA: Insufficient documentation

## 2014-10-31 DIAGNOSIS — Z808 Family history of malignant neoplasm of other organs or systems: Secondary | ICD-10-CM | POA: Insufficient documentation

## 2014-10-31 HISTORY — DX: Cardiac murmur, unspecified: R01.1

## 2014-10-31 LAB — CBC
HCT: 44.2 % (ref 35.0–47.0)
Hemoglobin: 14.5 g/dL (ref 12.0–16.0)
MCH: 28 pg (ref 26.0–34.0)
MCHC: 32.8 g/dL (ref 32.0–36.0)
MCV: 85.4 fL (ref 80.0–100.0)
Platelets: 280 10*3/uL (ref 150–440)
RBC: 5.18 MIL/uL (ref 3.80–5.20)
RDW: 14.4 % (ref 11.5–14.5)
WBC: 8.1 10*3/uL (ref 3.6–11.0)

## 2014-10-31 LAB — PROTIME-INR
INR: 0.85
Prothrombin Time: 11.8 seconds (ref 11.4–15.0)

## 2014-10-31 LAB — HEMOGLOBIN AND HEMATOCRIT, BLOOD
HEMATOCRIT: 43.3 % (ref 35.0–47.0)
Hemoglobin: 14.2 g/dL (ref 12.0–16.0)

## 2014-10-31 MED ORDER — LOSARTAN POTASSIUM 50 MG PO TABS: 50.0000 mg | ORAL_TABLET | Freq: Every day | ORAL | Status: DC

## 2014-10-31 MED ORDER — LOSARTAN POTASSIUM 50 MG PO TABS
50.0000 mg | ORAL_TABLET | Freq: Every day | ORAL | Status: DC
  Administered 2014-11-01: 50 mg via ORAL
  Filled 2014-10-31: qty 1

## 2014-10-31 MED ORDER — HYDROCHLOROTHIAZIDE 50 MG PO TABS
50.0000 mg | ORAL_TABLET | Freq: Every day | ORAL | Status: DC
  Administered 2014-11-01: 50 mg via ORAL
  Filled 2014-10-31 (×2): qty 1

## 2014-10-31 MED ORDER — FENTANYL CITRATE (PF) 100 MCG/2ML IJ SOLN
INTRAMUSCULAR | Status: AC | PRN
  Administered 2014-10-31: 50 ug via INTRAVENOUS

## 2014-10-31 MED ORDER — LETROZOLE 2.5 MG PO TABS
2.5000 mg | ORAL_TABLET | Freq: Every day | ORAL | Status: DC
  Administered 2014-11-01: 2.5 mg via ORAL
  Filled 2014-10-31: qty 1

## 2014-10-31 MED ORDER — ACETAMINOPHEN 325 MG PO TABS: 650.0000 mg | ORAL_TABLET | ORAL | Status: DC | PRN

## 2014-10-31 MED ORDER — MIDAZOLAM HCL 5 MG/5ML IJ SOLN
INTRAMUSCULAR | Status: AC | PRN
  Administered 2014-10-31: 1 mg via INTRAVENOUS

## 2014-10-31 MED ORDER — OMEPRAZOLE 20 MG PO CPDR
20.0000 mg | DELAYED_RELEASE_CAPSULE | Freq: Every day | ORAL | Status: DC
  Administered 2014-11-01: 20 mg via ORAL
  Filled 2014-10-31: qty 1

## 2014-10-31 MED ORDER — LETROZOLE 2.5 MG PO TABS: 2.5000 mg | ORAL_TABLET | Freq: Every day | ORAL | Status: DC

## 2014-10-31 MED ORDER — DOCUSATE SODIUM 100 MG PO CAPS
100.0000 mg | ORAL_CAPSULE | Freq: Two times a day (BID) | ORAL | Status: DC
  Administered 2014-10-31: 100 mg via ORAL
  Filled 2014-10-31: qty 1

## 2014-10-31 MED ORDER — METOPROLOL TARTRATE 100 MG PO TABS: 100.0000 mg | ORAL_TABLET | Freq: Two times a day (BID) | ORAL | Status: DC

## 2014-10-31 MED ORDER — ONDANSETRON HCL 4 MG/2ML IJ SOLN: 4.0000 mg | INTRAMUSCULAR | Status: DC | PRN

## 2014-10-31 MED ORDER — METOPROLOL TARTRATE 100 MG PO TABS
100.0000 mg | ORAL_TABLET | Freq: Two times a day (BID) | ORAL | Status: DC
  Administered 2014-10-31 – 2014-11-01 (×2): 100 mg via ORAL
  Filled 2014-10-31 (×3): qty 1

## 2014-10-31 MED ORDER — AMLODIPINE BESYLATE 5 MG PO TABS
5.0000 mg | ORAL_TABLET | Freq: Every day | ORAL | Status: DC
  Administered 2014-11-01: 5 mg via ORAL
  Filled 2014-10-31: qty 1

## 2014-10-31 MED ORDER — DIPHENHYDRAMINE HCL 12.5 MG/5ML PO ELIX: 12.5000 mg | ORAL_SOLUTION | Freq: Four times a day (QID) | ORAL | Status: DC | PRN

## 2014-10-31 MED ORDER — DIPHENHYDRAMINE HCL 50 MG/ML IJ SOLN: 12.5000 mg | Freq: Four times a day (QID) | INTRAMUSCULAR | Status: DC | PRN

## 2014-10-31 MED ORDER — OXYCODONE-ACETAMINOPHEN 5-325 MG PO TABS
1.0000 | ORAL_TABLET | ORAL | Status: DC | PRN
  Administered 2014-11-01: 1 via ORAL
  Filled 2014-10-31: qty 1

## 2014-10-31 MED ORDER — AMLODIPINE BESYLATE 5 MG PO TABS: 5.0000 mg | ORAL_TABLET | Freq: Every day | ORAL | Status: DC

## 2014-10-31 MED ORDER — SODIUM CHLORIDE 0.9 % IJ SOLN: 3.0000 mL | INTRAMUSCULAR | Status: DC | PRN

## 2014-10-31 MED ORDER — SODIUM CHLORIDE 0.9 % IV SOLN
INTRAVENOUS | Status: DC
  Administered 2014-10-31: 08:00:00 via INTRAVENOUS

## 2014-10-31 MED ORDER — PANTOPRAZOLE SODIUM 40 MG PO TBEC: 40.0000 mg | DELAYED_RELEASE_TABLET | Freq: Every day | ORAL | Status: DC

## 2014-10-31 NOTE — H&P (Signed)
Expand All Collapse All     10/24/2014 5:48 PM   Tara Ramos [birth year] [id number]  Referring provider: Adin Hector, MD [name], [place] [address]  Chief Complaint  Patient presents with  . Pre-op Exam    Renal biopsy. Surgery date 10/31/14 @7 :30am    HPI: 81 year old relatively healthy female who presented to the emergency room with severe constipation underwent CT abdomen and pelvis with contrast showing multiple bilateral renal masses.  She underwent an MRI to confirm tthe size and location of these lesions. She was noted to have a 4.5 x 2.8 x 2.2 cm RIGHT medial enhancing renal mass along with a 1.6 cm enhancing inferior pole renal mass. On the LEFT, she was found to have a 1.9 cm LEFT enhancing renal mass as well.  She does have a history of breast cancer, first diagnosed in 1990 status post lumpectomy and radiation. She developed recurrent disease in the same breast in 2014 at which time she underwent lumpectomy and no other therapy needed.  CT of the chest 08/17/14 showed no evidence for lung metastases but an incidental T7 compression fracture concerning was noted of which metastatic involvement not excluded. Bone scan was also somewhat concerning for pathological fracture. She most recently underwent a kyphoplasty with biopsy with was NEGATIVE for malignancy. She was also noted to have an incidental 11 mm left thyroid nodule.  Creatinine on 07/2014 0.96.   No previous abdominal surgeries.   She presents returns today discuss management of her renal masses and recommended renal biopsy prior to nephroectomy.   PMH: Past Medical History  Diagnosis Date  . Hemorrhoids   . Arthritis   . 174.4 12/16/2012    Invasive mammary carcinoma the upper-outer quadrant right breast with DCIS, grade 2, 11 mm diameter, negative margins, ER 90%, PR 10 to HER-2/neu not over expressed, nformal discussion with radiation therapy: no  indication for repeat radiation. .  . Status post radiation therapy     breast cancer bilateral  . GERD (gastroesophageal reflux disease)   . Hypertension   . Chronic kidney disease     Surgical History: Past Surgical History  Procedure Laterality Date  . Other surgical history      radiation therapy breast  . Breast lumpectomy Left 1990  . Breast lumpectomy Right 1992  . Breast biopsy Right 2014    ATYPICAL DUCTAL PROLIFERATION WITH MARKED DISTORTION BY  . Eye surgery Bilateral     cataract extraction  . Dilation and curettage of uterus    . Kyphoplasty N/A 10/09/2014    Procedure: KYPHOPLASTY; Surgeon: Hessie Knows, MD; Location: [facility] ORS; Service: Orthopedics; Laterality: N/A; T7 Kyphoplasty with bone biopsy    Home Medications:    Medication List       This list is accurate as of: 10/24/14 5:48 PM. Always use your most recent med list.              amLODipine 2.5 MG tablet  Commonly known as: NORVASC  Take 5 mg by mouth daily.     Calcium-Vitamin D 600-200 MG-UNIT per tablet  Take 1 tablet by mouth daily.     CENTRUM SILVER ULTRA WOMENS PO  Take 1 tablet by mouth daily.     hydrochlorothiazide 25 MG tablet  Commonly known as: HYDRODIURIL  Take 50 mg by mouth daily.     HYDROcodone-acetaminophen 5-325 MG per tablet  Commonly known as: NORCO  Take 1-2 tablets by mouth every 6 (six) hours  as needed for moderate pain.     letrozole 2.5 MG tablet  Commonly known as: FEMARA  Take 1 tablet (2.5 mg total) by mouth daily.     losartan 100 MG tablet  Commonly known as: COZAAR  Take 50 mg by mouth daily.     metoprolol 100 MG tablet  Commonly known as: LOPRESSOR  Take 100 mg by mouth 2 (two) times daily.     omeprazole 20 MG capsule  Commonly known as: PRILOSEC  Take 20 mg by mouth daily.     VITAMIN D PO  Take 1,000 Units by  mouth daily.        Allergies:  Allergies  Allergen Reactions  . Lisinopril Cough  . Morphine And Related Nausea And Vomiting    Family History: Family History  Problem Relation Age of Onset  . Stroke Father   . Stomach cancer Mother   . Brain cancer Sister     Social History:  reports that she quit smoking about 42 years ago. Her smoking use included Cigarettes. She quit after 20 years of use. She has never used smokeless tobacco. She reports that she drinks about 0.6 oz of alcohol per week. She reports that she does not use illicit drugs.   Physical Exam: BP 158/81 mmHg  Pulse 60  Ht 5' 2"  (1.575 m)  Wt 171 lb 6.4 oz (77.747 kg)  BMI 31.34 kg/m2  Constitutional: Alert and oriented, No acute distress. HEENT: [place] AT, moist mucus membranes. Trachea midline, no masses. Cardiovascular: No clubbing, cyanosis, or edema. Respiratory: Normal respiratory effort, no increased work of breathing. GI: Abdomen is soft, nontender, nondistended, no abdominal masses GU: No CVA tenderness.  Skin: No rashes, bruises or suspicious lesions. Neurologic: Grossly intact, no focal deficits, moving all 4 extremities. Psychiatric: Normal mood and affect.  Laboratory Data:  Recent Labs    Lab Results  Component Value Date   WBC 15.5* 08/01/2014   HGB 16.3* 08/01/2014   HCT 50.5* 08/01/2014   MCV 86 08/01/2014   PLT 398 08/01/2014       Recent Labs    Lab Results  Component Value Date   CREATININE 0.96 08/08/2014     Bone biopsy 10/09/14 DIAGNOSIS:  A. BONE, T7 VERTEBRA; BIOPSY:  - NEW BONE FORMATION AND MARROW FIBROSIS CONSISTENT WITH FRACTURE.  - NEGATIVE FOR MALIGNANCY.   Assessment & Plan: 81 year old female with right renal mass 2 and left renal mass. She is now status post lap see of a T7 compression fracture which is negative for malignancy. At her multifocal renal masses, is some concern that his lesions may  not represent renal cell carcinoma. As such, I do think it is prudent to proceed with biopsy of the largest right upper pole mass to confirm the diagnosis prior to surgical intervention. If the pathology is consistent with renal cell carcinoma, recommend either laparoscopic right radical nephrectomy versus partial nephrectomy 2 along with either continued surveillance of the left renal mass versus cryotherapy of this lesion. Patient is agreeable with this plan. We discussed the importance of sparing as much nephrons as possible given her multifocal disease. We discussed the risks of kidney biopsy today primarily risk of bleeding and plan for overnight observation following this procedure. There is also a theoretical risk of tumor seeding, although, this is extremely low risk.  1. Right renal mass x 2 -schedule IR biopsy of largest right upper pole renal mass -will call to discuss results and arrange follow up plan  2. Left renal mass x 1 -will either follow this lesion vs. Consider percutaneous intervention once right sided tumors treated with pathology c/w RCC   Hollice Espy, MD  [facility] [address] [place], [place] [address] [phone]

## 2014-10-31 NOTE — Procedures (Signed)
Discussed procedure and risks with patient. Informed consent obtained. Will perform CT-guided biopsy of right renal mass.

## 2014-10-31 NOTE — Procedures (Signed)
Under CT guidance, biopsy of right renal mass was performed. No immediate complication.

## 2014-11-01 DIAGNOSIS — C641 Malignant neoplasm of right kidney, except renal pelvis: Secondary | ICD-10-CM | POA: Diagnosis not present

## 2014-11-01 LAB — SURGICAL PATHOLOGY

## 2014-11-01 LAB — HEMOGLOBIN AND HEMATOCRIT, BLOOD
HEMATOCRIT: 43.6 % (ref 35.0–47.0)
HEMOGLOBIN: 14.2 g/dL (ref 12.0–16.0)

## 2014-11-01 MED ORDER — HYDRALAZINE HCL 10 MG PO TABS: 5.0000 mg | ORAL_TABLET | Freq: Once | ORAL | Status: DC

## 2014-11-01 MED ORDER — HYDRALAZINE HCL 10 MG PO TABS
10.0000 mg | ORAL_TABLET | Freq: Once | ORAL | Status: AC
  Administered 2014-11-01: 10 mg via ORAL
  Filled 2014-11-01: qty 1

## 2014-11-01 MED ORDER — HYDRALAZINE HCL 20 MG/ML IJ SOLN
10.0000 mg | Freq: Once | INTRAMUSCULAR | Status: AC
  Administered 2014-11-01: 10 mg via INTRAVENOUS
  Filled 2014-11-01: qty 1

## 2014-11-01 NOTE — Progress Notes (Signed)
Patient has been advised to PCP regarding high blood pressure, follow up appointment made with Ernst Spell

## 2014-11-01 NOTE — Progress Notes (Signed)
Notified md of elevated bp, orders received.

## 2014-11-01 NOTE — Discharge Instructions (Signed)
Kidney Biopsy, Care After °Refer to this sheet in the next few weeks. These instructions provide you with information on caring for yourself after your procedure. Your health care provider may also give you more specific instructions. Your treatment has been planned according to current medical practices, but problems sometimes occur. Call your health care provider if you have any problems or questions after your procedure.  °WHAT TO EXPECT AFTER THE PROCEDURE  °· You may notice blood in the urine for the first 24 hours after the biopsy. °· You may feel some pain at the biopsy site for 1-2 weeks after the biopsy. °HOME CARE INSTRUCTIONS °· Do not lift anything heavier than 10 lb (4.5 kg) for 2 weeks. °· Do not take any non-steroidal anti-inflammatory drugs (NSAIDs) or any blood thinners for a week after the biopsy unless instructed to do so by your health care provider. °· Only take medicines for pain, fever, or discomfort as directed by your health care provider. °SEEK MEDICAL CARE IF: °· You have bloody urine more than 24 hours after the biopsy.   °· You develop a fever.   °· You cannot urinate.   °· You have increasing pain at the biopsy site.   °SEEK IMMEDIATE MEDICAL CARE IF: °You feel faint or dizzy.  °Document Released: 12/14/2012 Document Reviewed: 12/14/2012 °Exit[employer]® Patient Information ©2015 [employer]. This information is not intended to replace advice given to you by your health care provider. Make sure you discuss any questions you have with your health care provider. ° °

## 2014-11-01 NOTE — Discharge Summary (Signed)
Date of admission: 10/31/2014  Date of discharge: 11/01/2014  Admission diagnosis: Right renal mass   Discharge diagnosis: Right renal mass                 Hypertention  Secondary diagnoses:  Patient Active Problem List   Diagnosis Date Noted  . Right renal mass 10/31/2014  . Carcinoma in situ, breast, ductal 09/28/2014  . Acid reflux 09/28/2014  . HLD (hyperlipidemia) 09/28/2014  . BP (high blood pressure) 09/28/2014  . Osteopenia 09/28/2014  . Breast cancer 01/30/2013  . Breast mass, right 12/07/2012  . Abnormal mammogram 09/09/2012    History and Physical: For full details, please see admission history and physical. Briefly, Tara Ramos is a 81 y.o. year old patient with right renal mass x 2 and left renal mass admitted for observation following her biopsy.   Hospital Course: Patient tolerated the procedure well.  She was then transferred to the floor after an uneventful PACU stay.  Her hospital course was uncomplicated other than poorly controlled BP (aysmptomatic) requiring additional PO and IV medications for PB control.  On PPD#1   she had met discharge criteria: was eating a regular diet, was up and ambulating independently,  pain was well controlled, was voiding, and was ready to for discharge.   Laboratory values:   Recent Labs  10/31/14 0727 10/31/14 1747 11/01/14 0514  WBC 8.1  --   --   HGB 14.5 14.2 14.2  HCT 44.2 43.3 43.6   No results for input(s): NA, K, CL, CO2, GLUCOSE, BUN, CREATININE, CALCIUM in the last 72 hours.  Recent Labs  10/31/14 0727  INR 0.85   No results for input(s): LABURIN in the last 72 hours. No results found for this or any previous visit.  Disposition: Home  Discharge instruction: The patient was instructed to be ambulatory but told to refrain from heavy lifting, strenuous activity this week.    Discharge medications:   Medication List    ASK your doctor about these medications        amLODipine 2.5 MG tablet  Commonly  known as:  NORVASC  Take 5 mg by mouth daily.     Calcium-Vitamin D 600-200 MG-UNIT per tablet  Take 1 tablet by mouth daily.     CENTRUM SILVER ULTRA WOMENS PO  Take 1 tablet by mouth daily.     hydrochlorothiazide 25 MG tablet  Commonly known as:  HYDRODIURIL  Take 50 mg by mouth daily.     HYDROcodone-acetaminophen 5-325 MG per tablet  Commonly known as:  NORCO  Take 1-2 tablets by mouth every 6 (six) hours as needed for moderate pain.     letrozole 2.5 MG tablet  Commonly known as:  FEMARA  Take 1 tablet (2.5 mg total) by mouth daily.     losartan 100 MG tablet  Commonly known as:  COZAAR  Take 50 mg by mouth daily.     metoprolol 100 MG tablet  Commonly known as:  LOPRESSOR  Take 100 mg by mouth 2 (two) times daily.     omeprazole 20 MG capsule  Commonly known as:  PRILOSEC  Take 20 mg by mouth daily.     VITAMIN D PO  Take 1,000 Units by mouth daily.        Followup:  Will call with biopsy results, follow up plan.    Patient advised to call PCP this AM to arrange for BP check, follow up.

## 2014-11-01 NOTE — Progress Notes (Signed)
  Subjective: The patient is doing well.  No nausea or vomiting. Pain is adequately controlled.  Severe HTN overnight, asymptomatic despite home med.  Additional hydralzine PO followed by IV required.  No pain, dizziness, HA, or neurological symptoms.  Objective: Vital signs in last 24 hours: Temp:  [97.7 F (36.5 C)-98.5 F (36.9 C)] 98.1 F (36.7 C) (07/07 0401) Pulse Rate:  [52-77] 67 (07/07 0446) Resp:  [14-22] 17 (07/07 0401) BP: (164-221)/(71-92) 176/80 mmHg (07/07 0451) SpO2:  [94 %-100 %] 100 % (07/07 0401)  Intake/Output from previous day: 07/06 0701 - 07/07 0700 In: 0  Out: 900 [Urine:900] Intake/Output this shift:    Physical Exam:  General: Alert and oriented. CV: RRR Lungs: Clear bilaterally. GI: Soft, Nondistended. Incision: Right flank needle stick c/d/i. Extremities: Nontender, no erythema, no edema.  Lab Results:  Recent Labs  10/31/14 0727 10/31/14 1747 11/01/14 0514  HGB 14.5 14.2 14.2  HCT 44.2 43.3 43.6         No results for input(s): CREATININE in the last 168 hours.         Results for orders placed or performed during the hospital encounter of 10/31/14 (from the past 24 hour(s))  Hemoglobin and hematocrit, blood     Status: None   Collection Time: 10/31/14  5:47 PM  Result Value Ref Range   Hemoglobin 14.2 12.0 - 16.0 g/dL   HCT 43.3 35.0 - 47.0 %  Hemoglobin and hematocrit, blood     Status: None   Collection Time: 11/01/14  5:14 AM  Result Value Ref Range   Hemoglobin 14.2 12.0 - 16.0 g/dL   HCT 43.6 35.0 - 47.0 %    Assessment/Plan: PPD #11 s/p right renal biopsy   1) Home BP meds 2) Hct stable 3) d/c home today with PCP follow up for BP  Hollice Espy, MD     Hollice Espy 11/01/2014, 7:42 AM

## 2014-11-01 NOTE — Progress Notes (Signed)
Notified MD of elevated BP. Hydralazine given x 1 with little effectiveness. MD orders to give hydralazine iv x 1 now. Will continue to monitor.

## 2014-11-06 ENCOUNTER — Telehealth: Payer: Self-pay | Admitting: Urology

## 2014-11-06 DIAGNOSIS — C641 Malignant neoplasm of right kidney, except renal pelvis: Secondary | ICD-10-CM

## 2014-11-06 NOTE — Telephone Encounter (Signed)
Right renal biopsy results consistent with high grade carcinoma of renal etiology.  Patholgy reviewed with Dr. Orvilla Cornwall as well as with the patient today by telephone.  As previously discussed, we'll refer patient to [facility] to proceed with right laparoscopic radical nephrectomy versus partial nephrectomy 2. Imaging previously reviewed with Dr. Tresa Moore will be assuming her care.  Tara Ramos is agreeable with this plan.  Hollice Espy, MD

## 2014-11-19 ENCOUNTER — Telehealth: Payer: Self-pay | Admitting: Urology

## 2014-11-19 NOTE — Telephone Encounter (Signed)
Patient is scheduled for an appointment with Dr. Alyson Ingles at [facility], [place], [place], [place] on 11/23/2014.  Their office number is [phone].

## 2014-11-26 DIAGNOSIS — C641 Malignant neoplasm of right kidney, except renal pelvis: Secondary | ICD-10-CM

## 2014-11-26 HISTORY — DX: Malignant neoplasm of right kidney, except renal pelvis: C64.1

## 2014-11-28 ENCOUNTER — Other Ambulatory Visit: Payer: Self-pay | Admitting: Urology

## 2015-01-22 NOTE — Patient Instructions (Signed)
Tara Ramos  01/22/2015   Your procedure is scheduled on: 02/01/2015    Report to [facility]  Entrance take [facility]  elevators to 3rd floor to  [place] at    0900 AM.  Call this number if you have problems the morning of surgery [phone]   Remember: ONLY 1 PERSON MAY GO WITH YOU TO SHORT STAY TO GET  READY MORNING OF Tara Ramos.  Do not eat food or drink liquids :After Midnight.     Take these medicines the morning of surgery with A SIP OF WATER: Amlodipine ( NOrvasc), Metoprolol ( Lopressor), Prilosec                                 You may not have any metal on your body including hair pins and              piercings  Do not wear jewelry, make-up, lotions, powders or perfumes, deodorant             Do not wear nail polish.  Do not shave  48 hours prior to surgery.              Men may shave face and neck.   Do not bring valuables to the hospital. [place].  Contacts, dentures or bridgework may not be worn into surgery.  Leave suitcase in the car. After surgery it may be brought to your room.       Special Instructions: coughing and deep breathing exercises, leg exercises               Please read over the following fact sheets you were given: _____________________________________________________________________             [facility] - Preparing for Surgery Before surgery, you can play an important role.  Because skin is not sterile, your skin needs to be as free of germs as possible.  You can reduce the number of germs on your skin by washing with CHG (chlorahexidine gluconate) soap before surgery.  CHG is an antiseptic cleaner which kills germs and bonds with the skin to continue killing germs even after washing. Please DO NOT use if you have an allergy to CHG or antibacterial soaps.  If your skin becomes reddened/irritated stop using the CHG and inform your nurse when you arrive  at Short Stay. Do not shave (including legs and underarms) for at least 48 hours prior to the first CHG shower.  You may shave your face/neck. Please follow these instructions carefully:  1.  Shower with CHG Soap the night before surgery and the  morning of Surgery.  2.  If you choose to wash your hair, wash your hair first as usual with your  normal  shampoo.  3.  After you shampoo, rinse your hair and body thoroughly to remove the  shampoo.                           4.  Use CHG as you would any other liquid soap.  You can apply chg directly  to the skin and wash  Gently with a scrungie or clean washcloth.  5.  Apply the CHG Soap to your body ONLY FROM THE NECK DOWN.   Do not use on face/ open                           Wound or open sores. Avoid contact with eyes, ears mouth and genitals (private parts).                       Wash face,  Genitals (private parts) with your normal soap.             6.  Wash thoroughly, paying special attention to the area where your surgery  will be performed.  7.  Thoroughly rinse your body with warm water from the neck down.  8.  DO NOT shower/wash with your normal soap after using and rinsing off  the CHG Soap.                9.  Pat yourself dry with a clean towel.            10.  Wear clean pajamas.            11.  Place clean sheets on your bed the night of your first shower and do not  sleep with pets. Day of Surgery : Do not apply any lotions/deodorants the morning of surgery.  Please wear clean clothes to the hospital/surgery center.  FAILURE TO FOLLOW THESE INSTRUCTIONS MAY RESULT IN THE CANCELLATION OF YOUR SURGERY PATIENT SIGNATURE_________________________________  NURSE SIGNATURE__________________________________  ________________________________________________________________________  WHAT IS A BLOOD TRANSFUSION? Blood Transfusion Information  A transfusion is the replacement of blood or some of its parts. Blood is made  up of multiple cells which provide different functions.  Red blood cells carry oxygen and are used for blood loss replacement.  White blood cells fight against infection.  Platelets control bleeding.  Plasma helps clot blood.  Other blood products are available for specialized needs, such as hemophilia or other clotting disorders. BEFORE THE TRANSFUSION  Who gives blood for transfusions?   Healthy volunteers who are fully evaluated to make sure their blood is safe. This is blood bank blood. Transfusion therapy is the safest it has ever been in the practice of medicine. Before blood is taken from a donor, a complete history is taken to make sure that person has no history of diseases nor engages in risky social behavior (examples are intravenous drug use or sexual activity with multiple partners). The donor's travel history is screened to minimize risk of transmitting infections, such as malaria. The donated blood is tested for signs of infectious diseases, such as HIV and hepatitis. The blood is then tested to be sure it is compatible with you in order to minimize the chance of a transfusion reaction. If you or a relative donates blood, this is often done in anticipation of surgery and is not appropriate for emergency situations. It takes many days to process the donated blood. RISKS AND COMPLICATIONS Although transfusion therapy is very safe and saves many lives, the main dangers of transfusion include:   Getting an infectious disease.  Developing a transfusion reaction. This is an allergic reaction to something in the blood you were given. Every precaution is taken to prevent this. The decision to have a blood transfusion has been considered carefully by your caregiver before blood is given. Blood is not given unless the benefits outweigh  the risks. AFTER THE TRANSFUSION  Right after receiving a blood transfusion, you will usually feel much better and more energetic. This is especially  true if your red blood cells have gotten low (anemic). The transfusion raises the level of the red blood cells which carry oxygen, and this usually causes an energy increase.  The nurse administering the transfusion will monitor you carefully for complications. HOME CARE INSTRUCTIONS  No special instructions are needed after a transfusion. You may find your energy is better. Speak with your caregiver about any limitations on activity for underlying diseases you may have. SEEK MEDICAL CARE IF:   Your condition is not improving after your transfusion.  You develop redness or irritation at the intravenous (IV) site. SEEK IMMEDIATE MEDICAL CARE IF:  Any of the following symptoms occur over the next 12 hours:  Shaking chills.  You have a temperature by mouth above 102 F (38.9 C), not controlled by medicine.  Chest, back, or muscle pain.  People around you feel you are not acting correctly or are confused.  Shortness of breath or difficulty breathing.  Dizziness and fainting.  You get a rash or develop hives.  You have a decrease in urine output.  Your urine turns a dark color or changes to pink, red, or brown. Any of the following symptoms occur over the next 10 days:  You have a temperature by mouth above 102 F (38.9 C), not controlled by medicine.  Shortness of breath.  Weakness after normal activity.  The white part of the eye turns yellow (jaundice).  You have a decrease in the amount of urine or are urinating less often.  Your urine turns a dark color or changes to pink, red, or brown. Document Released: 04/10/2000 Document Revised: 07/06/2011 Document Reviewed: 11/28/2007 [facility] Patient Information 2014 [place], [place].  _______________________________________________________________________

## 2015-01-24 ENCOUNTER — Encounter (HOSPITAL_COMMUNITY)
Admission: RE | Admit: 2015-01-24 | Discharge: 2015-01-24 | Disposition: A | Discharge status: Home / Self Care | Payer: PPO | Source: Ambulatory Visit | Attending: Urology | Admitting: Urology

## 2015-01-24 ENCOUNTER — Encounter (HOSPITAL_COMMUNITY): Payer: Self-pay

## 2015-01-24 DIAGNOSIS — N2889 Other specified disorders of kidney and ureter: Secondary | ICD-10-CM | POA: Insufficient documentation

## 2015-01-24 DIAGNOSIS — Z01818 Encounter for other preprocedural examination: Secondary | ICD-10-CM | POA: Insufficient documentation

## 2015-01-24 LAB — BASIC METABOLIC PANEL
Anion gap: 8 (ref 5–15)
BUN: 28 mg/dL — AB (ref 6–20)
CHLORIDE: 101 mmol/L (ref 101–111)
CO2: 30 mmol/L (ref 22–32)
CREATININE: 0.86 mg/dL (ref 0.44–1.00)
Calcium: 9.9 mg/dL (ref 8.9–10.3)
GFR calc Af Amer: >60 mL/min (ref >60)
GFR calc non Af Amer: >60 mL/min (ref >60)
GLUCOSE: 102 mg/dL — AB (ref 65–99)
Potassium: 3.9 mmol/L (ref 3.5–5.1)
SODIUM: 139 mmol/L (ref 135–145)

## 2015-01-24 LAB — ABO/RH: ABO/RH(D): O POS

## 2015-01-24 LAB — CBC
HCT: 46.7 % — ABNORMAL HIGH (ref 36.0–46.0)
HEMOGLOBIN: 15.3 g/dL — AB (ref 12.0–15.0)
MCH: 28.6 pg (ref 26.0–34.0)
MCHC: 32.8 g/dL (ref 30.0–36.0)
MCV: 87.3 fL (ref 78.0–100.0)
PLATELETS: 307 10*3/uL (ref 150–400)
RBC: 5.35 MIL/uL — ABNORMAL HIGH (ref 3.87–5.11)
RDW: 13.5 % (ref 11.5–15.5)
WBC: 9 10*3/uL (ref 4.0–10.5)

## 2015-01-24 NOTE — Progress Notes (Signed)
EKG- 10/08/2014- EPIC  Clearance- Dr Ramonita Lab 11/29/2014 on chart

## 2015-01-24 NOTE — Progress Notes (Signed)
BMP done 01/24/15 faxed via EPIC to Dr Alyson Ingles.

## 2015-02-01 ENCOUNTER — Inpatient Hospital Stay (HOSPITAL_COMMUNITY)
Admission: RE | Admit: 2015-02-01 | Discharge: 2015-02-02 | DRG: 658 | Disposition: A | Discharge status: Home / Self Care | Payer: PPO | Source: Ambulatory Visit | Attending: Urology | Admitting: Urology

## 2015-02-01 ENCOUNTER — Inpatient Hospital Stay (HOSPITAL_COMMUNITY): Payer: PPO | Admitting: Certified Registered Nurse Anesthetist

## 2015-02-01 ENCOUNTER — Encounter (HOSPITAL_COMMUNITY): Payer: Self-pay | Admitting: *Deleted

## 2015-02-01 ENCOUNTER — Encounter (HOSPITAL_COMMUNITY)
Admission: RE | Disposition: A | Discharge status: Home / Self Care | Payer: Self-pay | Source: Ambulatory Visit | Attending: Urology

## 2015-02-01 DIAGNOSIS — R000 Tachycardia, unspecified: Secondary | ICD-10-CM | POA: Diagnosis not present

## 2015-02-01 DIAGNOSIS — K660 Peritoneal adhesions (postprocedural) (postinfection): Secondary | ICD-10-CM | POA: Diagnosis present

## 2015-02-01 DIAGNOSIS — Z823 Family history of stroke: Secondary | ICD-10-CM

## 2015-02-01 DIAGNOSIS — N189 Chronic kidney disease, unspecified: Secondary | ICD-10-CM | POA: Diagnosis present

## 2015-02-01 DIAGNOSIS — Z01812 Encounter for preprocedural laboratory examination: Secondary | ICD-10-CM

## 2015-02-01 DIAGNOSIS — Z808 Family history of malignant neoplasm of other organs or systems: Secondary | ICD-10-CM

## 2015-02-01 DIAGNOSIS — I10 Essential (primary) hypertension: Secondary | ICD-10-CM | POA: Diagnosis not present

## 2015-02-01 DIAGNOSIS — R001 Bradycardia, unspecified: Secondary | ICD-10-CM | POA: Diagnosis present

## 2015-02-01 DIAGNOSIS — I480 Paroxysmal atrial fibrillation: Secondary | ICD-10-CM | POA: Diagnosis not present

## 2015-02-01 DIAGNOSIS — I9789 Other postprocedural complications and disorders of the circulatory system, not elsewhere classified: Secondary | ICD-10-CM

## 2015-02-01 DIAGNOSIS — I4891 Unspecified atrial fibrillation: Secondary | ICD-10-CM

## 2015-02-01 DIAGNOSIS — I2510 Atherosclerotic heart disease of native coronary artery without angina pectoris: Secondary | ICD-10-CM

## 2015-02-01 DIAGNOSIS — Z923 Personal history of irradiation: Secondary | ICD-10-CM

## 2015-02-01 DIAGNOSIS — Z79899 Other long term (current) drug therapy: Secondary | ICD-10-CM

## 2015-02-01 DIAGNOSIS — I129 Hypertensive chronic kidney disease with stage 1 through stage 4 chronic kidney disease, or unspecified chronic kidney disease: Secondary | ICD-10-CM | POA: Diagnosis present

## 2015-02-01 DIAGNOSIS — C641 Malignant neoplasm of right kidney, except renal pelvis: Principal | ICD-10-CM | POA: Diagnosis present

## 2015-02-01 DIAGNOSIS — Z853 Personal history of malignant neoplasm of breast: Secondary | ICD-10-CM

## 2015-02-01 DIAGNOSIS — N6310 Unspecified lump in the right breast, unspecified quadrant: Secondary | ICD-10-CM

## 2015-02-01 DIAGNOSIS — N2889 Other specified disorders of kidney and ureter: Secondary | ICD-10-CM | POA: Diagnosis present

## 2015-02-01 DIAGNOSIS — K219 Gastro-esophageal reflux disease without esophagitis: Secondary | ICD-10-CM | POA: Diagnosis present

## 2015-02-01 DIAGNOSIS — Z800 Family history of malignant neoplasm of digestive organs: Secondary | ICD-10-CM

## 2015-02-01 DIAGNOSIS — Z87891 Personal history of nicotine dependence: Secondary | ICD-10-CM

## 2015-02-01 HISTORY — PX: ROBOT ASSISTED LAPAROSCOPIC NEPHRECTOMY: SHX5140

## 2015-02-01 LAB — TYPE AND SCREEN
ABO/RH(D): O POS
Antibody Screen: NEGATIVE

## 2015-02-01 LAB — HEMOGLOBIN AND HEMATOCRIT, BLOOD
HCT: 42.8 % (ref 36.0–46.0)
HEMOGLOBIN: 14 g/dL (ref 12.0–15.0)

## 2015-02-01 SURGERY — ROBOTIC ASSISTED LAPAROSCOPIC NEPHRECTOMY
Anesthesia: General | Laterality: Right

## 2015-02-01 MED ORDER — METOPROLOL TARTRATE 1 MG/ML IV SOLN
INTRAVENOUS | Status: AC
  Filled 2015-02-01: qty 5

## 2015-02-01 MED ORDER — METOPROLOL TARTRATE 1 MG/ML IV SOLN
INTRAVENOUS | Status: AC
Start: 2015-02-01 — End: 2015-02-02
  Filled 2015-02-01: qty 5

## 2015-02-01 MED ORDER — EPHEDRINE SULFATE 50 MG/ML IJ SOLN
INTRAMUSCULAR | Status: AC
  Filled 2015-02-01: qty 1

## 2015-02-01 MED ORDER — DIPHENHYDRAMINE HCL 12.5 MG/5ML PO ELIX
12.5000 mg | ORAL_SOLUTION | Freq: Four times a day (QID) | ORAL | Status: DC | PRN
Start: 2015-02-01 — End: 2015-02-02

## 2015-02-01 MED ORDER — ONDANSETRON HCL 4 MG/2ML IJ SOLN: 4.0000 mg | INTRAMUSCULAR | Status: DC | PRN

## 2015-02-01 MED ORDER — NEOSTIGMINE METHYLSULFATE 10 MG/10ML IV SOLN
INTRAVENOUS | Status: AC
Start: 2015-02-01 — End: 2015-02-01
  Filled 2015-02-01: qty 1

## 2015-02-01 MED ORDER — DEXTROSE-NACL 5-0.45 % IV SOLN
INTRAVENOUS | Status: DC
  Administered 2015-02-01 – 2015-02-02 (×3): via INTRAVENOUS

## 2015-02-01 MED ORDER — METOPROLOL TARTRATE 50 MG PO TABS
100.0000 mg | ORAL_TABLET | Freq: Two times a day (BID) | ORAL | Status: DC
  Administered 2015-02-01: 100 mg via ORAL
  Filled 2015-02-01: qty 2

## 2015-02-01 MED ORDER — HYDROMORPHONE HCL 1 MG/ML IJ SOLN
INTRAMUSCULAR | Status: AC
  Administered 2015-02-01: 0.5 mg via INTRAVENOUS
  Filled 2015-02-01: qty 1

## 2015-02-01 MED ORDER — ATROPINE SULFATE 0.4 MG/ML IJ SOLN
INTRAMUSCULAR | Status: DC | PRN
  Administered 2015-02-01: 0.4 mg via INTRAVENOUS

## 2015-02-01 MED ORDER — HYDROCHLOROTHIAZIDE 50 MG PO TABS
50.0000 mg | ORAL_TABLET | Freq: Every day | ORAL | Status: DC
  Administered 2015-02-02: 50 mg via ORAL
  Filled 2015-02-01: qty 1

## 2015-02-01 MED ORDER — HEPARIN SODIUM (PORCINE) 5000 UNIT/ML IJ SOLN
5000.0000 [IU] | Freq: Once | INTRAMUSCULAR | Status: AC
  Administered 2015-02-01: 5000 [IU] via SUBCUTANEOUS
  Filled 2015-02-01: qty 1

## 2015-02-01 MED ORDER — LACTATED RINGERS IR SOLN
Status: DC | PRN
  Administered 2015-02-01: 1000 mL

## 2015-02-01 MED ORDER — SUCCINYLCHOLINE CHLORIDE 20 MG/ML IJ SOLN
INTRAMUSCULAR | Status: DC | PRN
  Administered 2015-02-01: 100 mg via INTRAVENOUS

## 2015-02-01 MED ORDER — HYDROMORPHONE HCL 1 MG/ML IJ SOLN
0.2500 mg | INTRAMUSCULAR | Status: DC | PRN
  Administered 2015-02-01 (×2): 0.25 mg via INTRAVENOUS
  Administered 2015-02-01 (×2): 0.5 mg via INTRAVENOUS

## 2015-02-01 MED ORDER — ACETAMINOPHEN 325 MG PO TABS: 650.0000 mg | ORAL_TABLET | ORAL | Status: DC | PRN

## 2015-02-01 MED ORDER — CEFAZOLIN SODIUM-DEXTROSE 2-3 GM-% IV SOLR
INTRAVENOUS | Status: AC
  Filled 2015-02-01: qty 50

## 2015-02-01 MED ORDER — SODIUM CHLORIDE 0.9 % IJ SOLN
INTRAMUSCULAR | Status: AC
  Filled 2015-02-01: qty 10

## 2015-02-01 MED ORDER — FENTANYL CITRATE (PF) 250 MCG/5ML IJ SOLN
INTRAMUSCULAR | Status: AC
  Filled 2015-02-01: qty 25

## 2015-02-01 MED ORDER — DIPHENHYDRAMINE HCL 50 MG/ML IJ SOLN: 12.5000 mg | Freq: Four times a day (QID) | INTRAMUSCULAR | Status: DC | PRN

## 2015-02-01 MED ORDER — EPHEDRINE SULFATE 50 MG/ML IJ SOLN
INTRAMUSCULAR | Status: DC | PRN
  Administered 2015-02-01 (×3): 10 mg via INTRAVENOUS

## 2015-02-01 MED ORDER — PANTOPRAZOLE SODIUM 40 MG PO TBEC
40.0000 mg | DELAYED_RELEASE_TABLET | Freq: Every day | ORAL | Status: DC
  Administered 2015-02-02: 40 mg via ORAL
  Filled 2015-02-01 (×2): qty 1

## 2015-02-01 MED ORDER — LIDOCAINE HCL (CARDIAC) 20 MG/ML IV SOLN
INTRAVENOUS | Status: DC | PRN
  Administered 2015-02-01: 50 mg via INTRAVENOUS

## 2015-02-01 MED ORDER — HYDROMORPHONE HCL 1 MG/ML IJ SOLN
0.5000 mg | INTRAMUSCULAR | Status: DC | PRN
  Administered 2015-02-01: 0.5 mg via INTRAVENOUS
  Administered 2015-02-01 – 2015-02-02 (×2): 1 mg via INTRAVENOUS
  Filled 2015-02-01 (×3): qty 1

## 2015-02-01 MED ORDER — FENTANYL CITRATE (PF) 100 MCG/2ML IJ SOLN
INTRAMUSCULAR | Status: DC | PRN
  Administered 2015-02-01 (×3): 50 ug via INTRAVENOUS
  Administered 2015-02-01: 100 ug via INTRAVENOUS

## 2015-02-01 MED ORDER — ONDANSETRON HCL 4 MG/2ML IJ SOLN
INTRAMUSCULAR | Status: DC | PRN
  Administered 2015-02-01: 4 mg via INTRAVENOUS

## 2015-02-01 MED ORDER — LACTATED RINGERS IV SOLN
INTRAVENOUS | Status: DC
  Administered 2015-02-01: 17:00:00 via INTRAVENOUS

## 2015-02-01 MED ORDER — PROMETHAZINE HCL 25 MG/ML IJ SOLN
6.2500 mg | INTRAMUSCULAR | Status: DC | PRN
  Administered 2015-02-01: 6.25 mg via INTRAVENOUS

## 2015-02-01 MED ORDER — LACTATED RINGERS IV SOLN
INTRAVENOUS | Status: DC
  Administered 2015-02-01 (×2): via INTRAVENOUS
  Administered 2015-02-01: 1000 mL via INTRAVENOUS

## 2015-02-01 MED ORDER — PROMETHAZINE HCL 25 MG/ML IJ SOLN
INTRAMUSCULAR | Status: AC
  Filled 2015-02-01: qty 1

## 2015-02-01 MED ORDER — ROCURONIUM BROMIDE 100 MG/10ML IV SOLN
INTRAVENOUS | Status: AC
  Filled 2015-02-01: qty 1

## 2015-02-01 MED ORDER — NEOSTIGMINE METHYLSULFATE 10 MG/10ML IV SOLN
INTRAVENOUS | Status: DC | PRN
Start: 2015-02-01 — End: 2015-02-01
  Administered 2015-02-01: 4 mg via INTRAVENOUS

## 2015-02-01 MED ORDER — AMLODIPINE BESYLATE 5 MG PO TABS
7.5000 mg | ORAL_TABLET | Freq: Every day | ORAL | Status: DC
  Administered 2015-02-02: 7.5 mg via ORAL
  Filled 2015-02-01: qty 1

## 2015-02-01 MED ORDER — PHENOL 1.4 % MT LIQD
1.0000 | OROMUCOSAL | Status: DC | PRN
  Filled 2015-02-01: qty 177

## 2015-02-01 MED ORDER — LETROZOLE 2.5 MG PO TABS
2.5000 mg | ORAL_TABLET | Freq: Every day | ORAL | Status: DC
  Administered 2015-02-01 – 2015-02-02 (×2): 2.5 mg via ORAL
  Filled 2015-02-01 (×2): qty 1

## 2015-02-01 MED ORDER — GLYCOPYRROLATE 0.2 MG/ML IJ SOLN
INTRAMUSCULAR | Status: DC | PRN
  Administered 2015-02-01: 0.6 mg via INTRAVENOUS

## 2015-02-01 MED ORDER — PROPOFOL 10 MG/ML IV BOLUS
INTRAVENOUS | Status: DC | PRN
  Administered 2015-02-01: 200 mg via INTRAVENOUS

## 2015-02-01 MED ORDER — MIDAZOLAM HCL 5 MG/5ML IJ SOLN
INTRAMUSCULAR | Status: DC | PRN
  Administered 2015-02-01: 2 mg via INTRAVENOUS

## 2015-02-01 MED ORDER — HYDROMORPHONE HCL 1 MG/ML IJ SOLN
INTRAMUSCULAR | Status: AC
  Filled 2015-02-01: qty 1

## 2015-02-01 MED ORDER — INFLUENZA VAC SPLIT QUAD 0.5 ML IM SUSY
0.5000 mL | PREFILLED_SYRINGE | INTRAMUSCULAR | Status: DC
  Filled 2015-02-01 (×2): qty 0.5

## 2015-02-01 MED ORDER — GLYCOPYRROLATE 0.2 MG/ML IJ SOLN
INTRAMUSCULAR | Status: AC
  Filled 2015-02-01: qty 3

## 2015-02-01 MED ORDER — HYDROCODONE-ACETAMINOPHEN 5-325 MG PO TABS: 1.0000 | ORAL_TABLET | Freq: Four times a day (QID) | ORAL | Status: DC | PRN

## 2015-02-01 MED ORDER — CEFAZOLIN SODIUM-DEXTROSE 2-3 GM-% IV SOLR
2.0000 g | INTRAVENOUS | Status: AC
  Administered 2015-02-01: 2 g via INTRAVENOUS

## 2015-02-01 MED ORDER — MEPERIDINE HCL 50 MG/ML IJ SOLN: 6.2500 mg | INTRAMUSCULAR | Status: DC | PRN

## 2015-02-01 MED ORDER — ATROPINE SULFATE 0.1 MG/ML IJ SOLN
INTRAMUSCULAR | Status: AC
  Filled 2015-02-01: qty 10

## 2015-02-01 MED ORDER — PROPOFOL 10 MG/ML IV BOLUS
INTRAVENOUS | Status: AC
  Filled 2015-02-01: qty 20

## 2015-02-01 MED ORDER — ROCURONIUM BROMIDE 100 MG/10ML IV SOLN
INTRAVENOUS | Status: DC | PRN
  Administered 2015-02-01 (×3): 20 mg via INTRAVENOUS
  Administered 2015-02-01 (×2): 30 mg via INTRAVENOUS

## 2015-02-01 MED ORDER — FENTANYL CITRATE (PF) 100 MCG/2ML IJ SOLN
25.0000 ug | INTRAMUSCULAR | Status: DC | PRN
  Administered 2015-02-01 (×3): 50 ug via INTRAVENOUS

## 2015-02-01 MED ORDER — MIDAZOLAM HCL 2 MG/2ML IJ SOLN
INTRAMUSCULAR | Status: AC
  Filled 2015-02-01: qty 4

## 2015-02-01 MED ORDER — MENTHOL 3 MG MT LOZG
1.0000 | LOZENGE | OROMUCOSAL | Status: DC | PRN
  Filled 2015-02-01: qty 9

## 2015-02-01 MED ORDER — METOPROLOL TARTRATE 1 MG/ML IV SOLN
2.5000 mg | INTRAVENOUS | Status: AC
  Administered 2015-02-01 (×4): 2.5 mg via INTRAVENOUS

## 2015-02-01 MED ORDER — LOSARTAN POTASSIUM 50 MG PO TABS
50.0000 mg | ORAL_TABLET | Freq: Every day | ORAL | Status: DC
  Administered 2015-02-02: 50 mg via ORAL
  Filled 2015-02-01: qty 1

## 2015-02-01 MED ORDER — BUPIVACAINE LIPOSOME 1.3 % IJ SUSP
20.0000 mL | Freq: Once | INTRAMUSCULAR | Status: AC
  Administered 2015-02-01: 20 mL
  Filled 2015-02-01: qty 20

## 2015-02-01 MED ORDER — LIDOCAINE HCL (CARDIAC) 20 MG/ML IV SOLN
INTRAVENOUS | Status: AC
  Filled 2015-02-01: qty 5

## 2015-02-01 MED ORDER — HYDROCODONE-ACETAMINOPHEN 5-325 MG PO TABS
1.0000 | ORAL_TABLET | ORAL | Status: DC | PRN
  Administered 2015-02-02 (×3): 1 via ORAL
  Filled 2015-02-01 (×3): qty 1

## 2015-02-01 MED ORDER — FENTANYL CITRATE (PF) 100 MCG/2ML IJ SOLN
INTRAMUSCULAR | Status: AC
  Filled 2015-02-01: qty 2

## 2015-02-01 MED ORDER — ONDANSETRON HCL 4 MG/2ML IJ SOLN
INTRAMUSCULAR | Status: AC
  Filled 2015-02-01: qty 2

## 2015-02-01 SURGICAL SUPPLY — 58 items
CHLORAPREP W/TINT 26ML (MISCELLANEOUS) ×3 IMPLANT
CLIP LIGATING HEM O LOK PURPLE (MISCELLANEOUS) ×6 IMPLANT
CLIP LIGATING HEMO LOK XL GOLD (MISCELLANEOUS) IMPLANT
CLIP LIGATING HEMO O LOK GREEN (MISCELLANEOUS) ×3 IMPLANT
CLIP SUT LAPRA TY ABSORB (SUTURE) ×3 IMPLANT
CORDS BIPOLAR (ELECTRODE) ×3 IMPLANT
COVER SURGICAL LIGHT HANDLE (MISCELLANEOUS) ×3 IMPLANT
COVER TIP SHEARS 8 DVNC (MISCELLANEOUS) ×1 IMPLANT
COVER TIP SHEARS 8MM DA VINCI (MISCELLANEOUS) ×2
CUTTER ECHEON FLEX ENDO 45 340 (ENDOMECHANICALS) ×3 IMPLANT
DECANTER SPIKE VIAL GLASS SM (MISCELLANEOUS) IMPLANT
DRAIN CHANNEL 15F RND FF 3/16 (WOUND CARE) IMPLANT
DRAPE INCISE IOBAN 66X45 STRL (DRAPES) ×3 IMPLANT
DRAPE LAPAROSCOPIC ABDOMINAL (DRAPES) IMPLANT
DRAPE SHEET LG 3/4 BI-LAMINATE (DRAPES) ×3 IMPLANT
DRAPE WARM FLUID 44X44 (DRAPE) ×3 IMPLANT
DRSG TEGADERM 2-3/8X2-3/4 SM (GAUZE/BANDAGES/DRESSINGS) ×3 IMPLANT
DRSG TEGADERM 4X4.75 (GAUZE/BANDAGES/DRESSINGS) ×3 IMPLANT
ELECT PENCIL ROCKER SW 15FT (MISCELLANEOUS) ×3 IMPLANT
ELECT REM PT RETURN 9FT ADLT (ELECTROSURGICAL) ×3
ELECTRODE REM PT RTRN 9FT ADLT (ELECTROSURGICAL) ×1 IMPLANT
EVACUATOR SILICONE 100CC (DRAIN) IMPLANT
GAUZE SPONGE 4X4 12PLY STRL (GAUZE/BANDAGES/DRESSINGS) ×3 IMPLANT
GLOVE BIO SURGEON STRL SZ8 (GLOVE) ×18 IMPLANT
GLOVE BIOGEL PI IND STRL 8 (GLOVE) ×1 IMPLANT
GLOVE BIOGEL PI INDICATOR 8 (GLOVE) ×2
GOWN STRL REUS W/TWL LRG LVL3 (GOWN DISPOSABLE) ×12 IMPLANT
HEMOSTAT SURGICEL 4X8 (HEMOSTASIS) ×3 IMPLANT
KIT ACCESSORY DA VINCI DISP (KITS) ×2
KIT ACCESSORY DVNC DISP (KITS) ×1 IMPLANT
KIT BASIN OR (CUSTOM PROCEDURE TRAY) ×3 IMPLANT
LIQUID BAND (GAUZE/BANDAGES/DRESSINGS) ×3 IMPLANT
LOOP VESSEL MAXI BLUE (MISCELLANEOUS) IMPLANT
NEEDLE INSUFFLATION 14GA 120MM (NEEDLE) ×3 IMPLANT
POSITIONER SURGICAL ARM (MISCELLANEOUS) ×12 IMPLANT
POUCH ENDO CATCH II 15MM (MISCELLANEOUS) ×3 IMPLANT
RELOAD WH ECHELON 45 (STAPLE) ×9 IMPLANT
SET TUBE IRRIG SUCTION NO TIP (IRRIGATION / IRRIGATOR) ×3 IMPLANT
SOLUTION ELECTROLUBE (MISCELLANEOUS) ×3 IMPLANT
SPONGE LAP 4X18 X RAY DECT (DISPOSABLE) ×3 IMPLANT
STAPLER VISISTAT 35W (STAPLE) ×3 IMPLANT
SUT ETHILON 3 0 PS 1 (SUTURE) ×3 IMPLANT
SUT MNCRL AB 4-0 PS2 18 (SUTURE) ×6 IMPLANT
SUT PDS AB 1 CTX 36 (SUTURE) ×6 IMPLANT
SUT PDS AB 1 TP1 96 (SUTURE) IMPLANT
SUT VIC AB 2-0 SH 27 (SUTURE) ×8
SUT VIC AB 2-0 SH 27X BRD (SUTURE) ×4 IMPLANT
SUT VICRYL 0 UR6 27IN ABS (SUTURE) ×6 IMPLANT
TAPE CLOTH 4X10 WHT NS (GAUZE/BANDAGES/DRESSINGS) ×3 IMPLANT
TOWEL OR NON WOVEN STRL DISP B (DISPOSABLE) ×3 IMPLANT
TRAY FOLEY W/METER SILVER 14FR (SET/KITS/TRAYS/PACK) ×3 IMPLANT
TRAY FOLEY W/METER SILVER 16FR (SET/KITS/TRAYS/PACK) IMPLANT
TRAY LAPAROSCOPIC (CUSTOM PROCEDURE TRAY) ×3 IMPLANT
TROCAR 12M 150ML BLUNT (TROCAR) IMPLANT
TROCAR BLADELESS OPT 5 100 (ENDOMECHANICALS) ×3 IMPLANT
TROCAR UNIVERSAL OPT 12M 100M (ENDOMECHANICALS) ×3 IMPLANT
TROCAR XCEL 12X100 BLDLESS (ENDOMECHANICALS) ×3 IMPLANT
WATER STERILE IRR 1500ML POUR (IV SOLUTION) ×3 IMPLANT

## 2015-02-01 NOTE — H&P (Signed)
Urology Admission H&P  Chief Complaint: right renal mass  History of Present Illness: Ms Behne is a 81yo with a h xof rigth renal RCC here for radical nephrectomy. She has a 5cm mesophytic lower pole right renal mass. She denies any hematuria. No fevers/chill/sweats  Past Medical History  Diagnosis Date  . Hemorrhoids   . Arthritis   . Status post radiation therapy     breast cancer bilateral  . GERD (gastroesophageal reflux disease)   . Hypertension   . Chronic kidney disease   . Heart murmur     hx of years ago   . 174.4 12/16/2012    right and left breast cancer    Past Surgical History  Procedure Laterality Date  . Other surgical history      radiation therapy breast  . Breast lumpectomy Left 1990  . Breast lumpectomy Right 1992  . Breast biopsy Right 2014    ATYPICAL DUCTAL PROLIFERATION WITH MARKED DISTORTION BY  . Eye surgery Bilateral     cataract extraction  . Dilation and curettage of uterus    . Kyphoplasty N/A 10/09/2014    Procedure: KYPHOPLASTY;  Surgeon: Hessie Knows, MD;  Location: [facility] ORS;  Service: Orthopedics;  Laterality: N/A;  T7 Kyphoplasty with bone biopsy    Home Medications:  Prescriptions prior to admission  Medication Sig Dispense Refill Last Dose  . amLODipine (NORVASC) 5 MG tablet Take 7.5 mg by mouth every morning.   02/01/2015 at 0700  . Calcium-Vitamin D-Vitamin K (VIACTIV) [phone] MG-UNT-MCG CHEW Chew 1 tablet by mouth daily.   01/31/2015 at Unknown time  . docusate sodium (COLACE) 100 MG capsule Take 100 mg by mouth at bedtime.   01/30/2015  . hydrochlorothiazide (HYDRODIURIL) 25 MG tablet Take 50 mg by mouth daily.   01/31/2015  . letrozole (FEMARA) 2.5 MG tablet Take 1 tablet (2.5 mg total) by mouth daily. (Patient taking differently: Take 2.5 mg by mouth every morning. ) 30 tablet 12 01/31/2015 at Unknown time  . losartan (COZAAR) 50 MG tablet Take 50 mg by mouth every morning.   01/31/2015 at Unknown time  . metoprolol (LOPRESSOR) 100 MG  tablet Take 100 mg by mouth 2 (two) times daily.   02/01/2015 at 0700  . Multiple Vitamins-Minerals (CENTRUM SILVER ULTRA WOMENS PO) Take 1 tablet by mouth daily.    01/31/2015 at Unknown time  . omeprazole (PRILOSEC) 20 MG capsule Take 20 mg by mouth every morning.    02/01/2015 at 0700  . HYDROcodone-acetaminophen (NORCO) 5-325 MG per tablet Take 1-2 tablets by mouth every 6 (six) hours as needed for moderate pain. (Patient not taking: Reported on 01/17/2015) 40 tablet 0 Past Month at Unknown time   Allergies:  Allergies  Allergen Reactions  . Lisinopril Cough  . Morphine And Related Nausea And Vomiting  . Other     Adhesive tape- causes blister     Family History  Problem Relation Age of Onset  . Stroke Father   . Stomach cancer Mother   . Brain cancer Sister    Social History:  reports that she quit smoking about 42 years ago. Her smoking use included Cigarettes. She quit after 20 years of use. She has never used smokeless tobacco. She reports that she drinks about 0.6 oz of alcohol per week. She reports that she does not use illicit drugs.  Review of Systems  All other systems reviewed and are negative.   Physical Exam:  Vital signs in last 24 hours: Temp:  [  97.8 F (36.6 C)] 97.8 F (36.6 C) (10/07 0903) Pulse Rate:  [60] 60 (10/07 0903) Resp:  [18] 18 (10/07 0903) BP: (174-202)/(66-85) 174/66 mmHg (10/07 0959) SpO2:  [98 %] 98 % (10/07 0903) Weight:  [74.844 kg (165 lb)] 74.844 kg (165 lb) (10/07 0626) Physical Exam  Constitutional: She is oriented to person, place, and time. She appears well-developed and well-nourished.  HENT:  Head: Normocephalic and atraumatic.  Eyes: EOM are normal. Pupils are equal, round, and reactive to light.  Neck: Normal range of motion. No thyromegaly present.  Cardiovascular: Normal rate and regular rhythm.   Respiratory: Effort normal. No respiratory distress.  GI: Soft. She exhibits no distension.  Musculoskeletal: Normal range of  motion.  Neurological: She is alert and oriented to person, place, and time.  Skin: Skin is warm and dry.  Psychiatric: She has a normal mood and affect. Her behavior is normal. Judgment and thought content normal.    Laboratory Data:  No results found for this or any previous visit (from the past 24 hour(s)). No results found for this or any previous visit (from the past 240 hour(s)). Creatinine: No results for input(s): CREATININE in the last 168 hours. Baseline Creatinine: unknown  Impression/Assessment:  81yo with right RRC  Plan:  The risks/benefits/alternatives to robot assisted laparoscopic right radical nephrectomy was explained to the patient and she understands and wishes to proceed with surgery.  MCKENZIE, PATRICK L 02/01/2015, 10:57 AM

## 2015-02-01 NOTE — Anesthesia Preprocedure Evaluation (Addendum)
Anesthesia Evaluation  Patient identified by MRN, date of birth, ID band Patient awake    Reviewed: Allergy & Precautions, NPO status , Patient's Chart, lab work & pertinent test results, reviewed documented beta blocker date and time   Airway Mallampati: I  TM Distance: >3 FB Neck ROM: Full    Dental  (+) Edentulous Upper, Edentulous Lower   Pulmonary former smoker,    breath sounds clear to auscultation       Cardiovascular hypertension, Pt. on medications and Pt. on home beta blockers  Rhythm:Regular Rate:Normal     Neuro/Psych negative neurological ROS  negative psych ROS   GI/Hepatic GERD  Medicated,  Endo/Other  negative endocrine ROS  Renal/GU CRFRenal disease  negative genitourinary   Musculoskeletal  (+) Arthritis ,   Abdominal   Peds negative pediatric ROS (+)  Hematology negative hematology ROS (+)   Anesthesia Other Findings   Reproductive/Obstetrics negative OB ROS                           Lab Results  Component Value Date   WBC 9.0 01/24/2015   HGB 15.3* 01/24/2015   HCT 46.7* 01/24/2015   MCV 87.3 01/24/2015   PLT 307 01/24/2015   Lab Results  Component Value Date   CREATININE 0.86 01/24/2015   BUN 28* 01/24/2015   NA 139 01/24/2015   K 3.9 01/24/2015   CL 101 01/24/2015   CO2 30 01/24/2015   Lab Results  Component Value Date   INR 0.85 10/31/2014   EKG: sinus bradycardia, 1st degree AV block.   Anesthesia Physical Anesthesia Plan  ASA: III  Anesthesia Plan: General   Post-op Pain Management:    Induction: Intravenous  Airway Management Planned: Oral ETT  Additional Equipment:   Intra-op Plan:   Post-operative Plan: Extubation in OR  Informed Consent: I have reviewed the patients History and Physical, chart, labs and discussed the procedure including the risks, benefits and alternatives for the proposed anesthesia with the patient or  authorized representative who has indicated his/her understanding and acceptance.   Dental advisory given  Plan Discussed with: CRNA  Anesthesia Plan Comments:        Anesthesia Quick Evaluation

## 2015-02-01 NOTE — Op Note (Signed)
Preoperative diagnosis: Right renal mass  Postop diagnosis: Same  Procedure: 1.  right robot assisted laparoscopic radical nephrectomy 2.  Lysis of adhesions, extensive   Attending: Nicolette Bang  Anesthesia: General  Estimated blood loss: 100 cc  Drains: 16 French Foley catheter  Specimens: right radical nephrectomy  Antibiotics: ancef  Findings: 1. Dense adhesions on liver from previous cholecystitis. Extensive adhesions of small bowel to anterior abdominal wall. 1 artery and 1 vein  Indications: Patient is a 81 year old with a history of 5 cm right renal mass.  The mass was not amenable to partial nephrectomy.  After discussing treatment options patient decided to proceed with right robot assisted laparoscopic radical nephrectomy.  Procedure in detail: Prior to procedure consent was obtained. Patient was brought to the operating room and briefing was done sure correct patient, correct procedure, correct site.  General anesthesia was in administered patient was placed in the left lateral decubitus position.  a 61 French catheter was placed. their abdomen and flank was then prepped and draped usual sterile fashion.  A Veress needle was used to obtain pneumoperitoneum.  Once pneumoperitoneum was reestablished to 15 mmHg we then placed a 12 mm camera port lateral to the umbilicus at the lateral edge of rectus.  We then proceeded to place 3 more robotic ports. We then placed an assistant port inferior to the camera in the midline. We then placed a 3mm port for the liver retractor in the midline above the assistant port. We then docked the robot.  We then started this dissection by removing a extensive amount of anterior abdominal wall adhesions and adhesions to the liver.  We then dissected along the white line of Toldt.  We then reflected the colon medially.  We then proceeded to kocherize the duodenum. We then identified the psoas muscle.  Once this was done we traced it down to the iliac  vessels and identified the ureter.  Once we identified the gonadal vein and ureter were then traced this to the renal hilum.  The renal vein and renal artery were skeletonized.  We did we identified one renal vein one renal artery.  Using the [employer] power stapler within ligated the renal artery.  Once this was done we then used a second staple load to ligate the renal vein.  We then used a hem-o-lock clips to ligate the gonadal vein and the ureter.  Once this was done we then freed the kidney from its lateral and posterior attachments.  We then used a Endo Catch bag to remove the specimen.  Once the specimen was in the Endo Catch bag we then inspected the retroperitoneum and noted no residual bleeding.  We then removed our instruments, undocked the robot, and released the pneumoperitoneum.  We then made a right lower quardant incision to remove the specimen.  Once the specimen was removed we then closed the camera and assistant ports with 0 Vicryl in interrupted fashion.  We then closed the  Right lower quadrant incision with 0 PDS in a running fashion.  We then closed the overlying skin with 2-0 Vicryl in running fashion.  The skin was then closed with staples.  This then concluded the procedure which was well tolerated by the patient.  Complications: None  Condition: Stable, x-rayed, transferred to PACU.  Plan: Patient is to be admitted for inpatient stay. The foley catheter will be removed in the morning. They will be started on a clear liquid diet POD#1

## 2015-02-01 NOTE — Consult Note (Signed)
Reason for Consult: atrial fibrillation   Requesting Physician: Alyson Ingles  Cardiologist: New  HPI: This is a 81 y.o. female with a past medical history significant for HTN, GERD, breast cancer (first diagnosed in 1990 status post lumpectomy and radiation; recurrent 2014, treated with lumpectomy and radiation), previous kyphoplasty T7, with bilateral renal masses, now s/p robotic assisted right nephrectomy for renal cell carcinoma, after which she developed atrial fibrillation with mild RVR, hemodynamically compensated.  Preop ECG shows minimal PR prolongation (226 ms), minor IVCD. Postop there is new mild ST segment depression and T wave inversion across the anterior precordium during atrial fibrillation with RVR.  Less than an hour later, she is back in normal rhythm (sinus bradycardia in the 50s).  She denies preop angina, dyspnea, edema, syncope, palpitations, focal neuro symptoms or claudication. She is still sleepy after surgery, but is clearly oriented and answers complex questions with precision.  More than 5 years ago she had an echo and stress test ordered by Dr. Ubaldo Glassing at the [facility]. The results were "normal". She cannot recall what triggered the evaluation, but she has not required additional follow up. Note coronary calcification on CT April 2016.   PMHx:  Past Medical History  Diagnosis Date  . Hemorrhoids   . Arthritis   . Status post radiation therapy     breast cancer bilateral  . GERD (gastroesophageal reflux disease)   . Hypertension   . Chronic kidney disease   . Heart murmur     hx of years ago   . 174.4 12/16/2012    right and left breast cancer    Past Surgical History  Procedure Laterality Date  . Other surgical history      radiation therapy breast  . Breast lumpectomy Left 1990  . Breast lumpectomy Right 1992  . Breast biopsy Right 2014    ATYPICAL DUCTAL PROLIFERATION WITH MARKED DISTORTION BY  . Eye surgery Bilateral     cataract  extraction  . Dilation and curettage of uterus    . Kyphoplasty N/A 10/09/2014    Procedure: KYPHOPLASTY;  Surgeon: Hessie Knows, MD;  Location: [facility] ORS;  Service: Orthopedics;  Laterality: N/A;  T7 Kyphoplasty with bone biopsy    FAMHx: Family History  Problem Relation Age of Onset  . Stroke Father   . Stomach cancer Mother   . Brain cancer Sister     SOCHx:  reports that she quit smoking about 42 years ago. Her smoking use included Cigarettes. She quit after 20 years of use. She has never used smokeless tobacco. She reports that she drinks about 0.6 oz of alcohol per week. She reports that she does not use illicit drugs.  ALLERGIES: Allergies  Allergen Reactions  . Lisinopril Cough  . Morphine And Related Nausea And Vomiting  . Other     Adhesive tape- causes blister     ROS: Pertinent items noted in HPI and remainder of comprehensive ROS otherwise negative.  HOME MEDICATIONS: Prescriptions prior to admission  Medication Sig Dispense Refill Last Dose  . amLODipine (NORVASC) 5 MG tablet Take 7.5 mg by mouth every morning.   02/01/2015 at 0700  . Calcium-Vitamin D-Vitamin K (VIACTIV) [phone] MG-UNT-MCG CHEW Chew 1 tablet by mouth daily.   01/31/2015 at Unknown time  . docusate sodium (COLACE) 100 MG capsule Take 100 mg by mouth at bedtime.   01/30/2015  . hydrochlorothiazide (HYDRODIURIL) 25 MG tablet Take 50 mg by mouth daily.   01/31/2015  .  letrozole (FEMARA) 2.5 MG tablet Take 1 tablet (2.5 mg total) by mouth daily. (Patient taking differently: Take 2.5 mg by mouth every morning. ) 30 tablet 12 01/31/2015 at Unknown time  . losartan (COZAAR) 50 MG tablet Take 50 mg by mouth every morning.   01/31/2015 at Unknown time  . metoprolol (LOPRESSOR) 100 MG tablet Take 100 mg by mouth 2 (two) times daily.   02/01/2015 at 0700  . Multiple Vitamins-Minerals (CENTRUM SILVER ULTRA WOMENS PO) Take 1 tablet by mouth daily.    01/31/2015 at Unknown time  . omeprazole (PRILOSEC) 20 MG capsule  Take 20 mg by mouth every morning.    02/01/2015 at 0700  . [DISCONTINUED] HYDROcodone-acetaminophen (NORCO) 5-325 MG per tablet Take 1-2 tablets by mouth every 6 (six) hours as needed for moderate pain. (Patient not taking: Reported on 01/17/2015) 40 tablet 0 Past Month at Unknown time     VITALS: Blood pressure 143/77, pulse 108, temperature 98.3 F (36.8 C), temperature source Oral, resp. rate 14, height 5\' 2"  (1.575 m), weight 165 lb (74.844 kg), SpO2 96 %.  PHYSICAL EXAM:  General: sleepy and easily awakened, oriented x3, no distress Head: no evidence of trauma, PERRL, EOMI, no exophtalmos or lid lag, no myxedema, no xanthelasma; normal ears, nose and oropharynx Neck: normal jugular venous pulsations and no hepatojugular reflux; brisk carotid pulses without delay and no carotid bruits Chest: clear to auscultation anteriorly, no signs of consolidation by percussion or palpation, normal fremitus, symmetrical and full respiratory excursions Cardiovascular: normal position and quality of the apical impulse, regular rhythm, normal first heart sound and normal second heart sound, no rubs or gallops, no murmur Abdomen: no tenderness or distention, no masses by palpation, no abnormal pulsatility or arterial bruits, normal bowel sounds, no hepatosplenomegaly Extremities: no clubbing, cyanosis;  no edema; 2+ radial, ulnar and brachial pulses bilaterally; 2+ right femoral, posterior tibial and dorsalis pedis pulses; 2+ left femoral, posterior tibial and dorsalis pedis pulses; no subclavian or femoral bruits Neurological: grossly nonfocal   LABS  CBC  Recent Labs  02/01/15 1608  HGB 14.0  HCT 40.9   Basic Metabolic Panel BMET    Component Value Date/Time   NA 139 01/24/2015 1315   NA 139 08/01/2014 0053   K 3.9 01/24/2015 1315   K 3.9 08/01/2014 0053   CL 101 01/24/2015 1315   CL 98* 08/01/2014 0053   CO2 30 01/24/2015 1315   CO2 28 08/01/2014 0053   GLUCOSE 102* 01/24/2015 1315    GLUCOSE 149* 08/01/2014 0053   BUN 28* 01/24/2015 1315   BUN 19 08/01/2014 0053   CREATININE 0.86 01/24/2015 1315   CREATININE 0.96 08/08/2014 0940   CALCIUM 9.9 01/24/2015 1315   CALCIUM 9.6 08/01/2014 0053   GFRNONAA >60 01/24/2015 1315   GFRNONAA 56* 08/08/2014 0940   GFRAA >60 01/24/2015 1315   GFRAA >60 08/08/2014 0940    IMAGING: No results found.  ECG: Preop ECG shows minimal PR prolongation (226 ms), minor IVCD. Postop there is new mild ST segment depression and T wave inversion across the anterior precordium  TELEMETRY: Sinus bradycardia 52-56 bpm  IMPRESSION/RECOMMENDATION: Paroxysmal postoperative atrial fibrillation, resolved Severe HTN, compensated Coronary calcification without known CAD, asymptomatic Nonspecific ST-T changes during tachycardia  Recommend that she remain on telemetry while inpatient. Anticoagulation is not indicated unless there is recurrent arrhythmia outside the immediate postop period. Discussed with Dr. Alyson Ingles, if necessary, could initiate anticoagulation 48 hours postop. If the arrhythmia does not return, recommend ASA  81 mg at DC.  It is reasonable to perform another echocardiogram, as well an outpatient 30 day event monitor to screen for asymptomatic atrial fibrillation. If atrial fibrillation is identified as an outpatient, long term anticoagulation would be indicated (CHADSVasc score at least 39 - age 60, gender, HTN).  In view of the ST changes during tachycardia, evaluation for CAD (e.g. Lexiscan Myoview) is also a sensible test. None of these procedures are emergent and the patient would prefer that they are performed via Dr. Bethanne Ginger office.  It is important that her beta blocker be continued without interruption to prevent rebound HTN and/or arrhythmia. Current mild bradycardia is likely an expression of residual sedation. The beta blocker provided adequate rate control during the episode of atrial fibrillation.   Time Spent Directly  with Patient: 60 minutes  Sanda Klein, MD, [facility] [phone] office [phone] pager   02/01/2015, 6:14 PM

## 2015-02-01 NOTE — Discharge Instructions (Signed)

## 2015-02-01 NOTE — Anesthesia Procedure Notes (Signed)
Procedure Name: Intubation Date/Time: 02/01/2015 11:46 AM Performed by: Dimas Millin, Colen Eltzroth F Pre-anesthesia Checklist: Patient identified, Emergency Drugs available, Suction available, Patient being monitored and Timeout performed Patient Re-evaluated:Patient Re-evaluated prior to inductionOxygen Delivery Method: Circle system utilized Preoxygenation: Pre-oxygenation with 100% oxygen Intubation Type: IV induction Ventilation: Mask ventilation without difficulty Laryngoscope Size: Miller and 2 Grade View: Grade I Tube type: Oral Tube size: 7.0 mm Number of attempts: 1 Airway Equipment and Method: Stylet Dental Injury: Teeth and Oropharynx as per pre-operative assessment

## 2015-02-01 NOTE — Brief Op Note (Signed)
02/01/2015  8:34 PM  PATIENT:  Tara Ramos  81 y.o. female  PRE-OPERATIVE DIAGNOSIS:  RIGHT RENAL MASS  POST-OPERATIVE DIAGNOSIS:  RIGHT RENAL MASS  PROCEDURE:  Procedure(s): ROBOTIC ASSISTED LAPAROSCOPIC RIGHT NEPHRECTOMY (Right)  SURGEON:  Surgeon(s) and Role:    * Cleon Gustin, MD - Primary  PHYSICIAN ASSISTANT:   ASSISTANTS: Debbrah Alar   ANESTHESIA:   general  EBL:     BLOOD ADMINISTERED:none  DRAINS: Urinary Catheter (Foley)   LOCAL MEDICATIONS USED:  MARCAINE     SPECIMEN:  Source of Specimen:  right kidney and adrenal  DISPOSITION OF SPECIMEN:  PATHOLOGY  COUNTS:  YES  TOURNIQUET:  * No tourniquets in log *  DICTATION: .Note written in EPIC  PLAN OF CARE: Admit to inpatient   PATIENT DISPOSITION:  PACU - hemodynamically stable.   Delay start of Pharmacological VTE agent (>24hrs) due to surgical blood loss or risk of bleeding: not applicable

## 2015-02-01 NOTE — Transfer of Care (Signed)
Immediate Anesthesia Transfer of Care Note  Patient: Tara Ramos  Procedure(s) Performed: Procedure(s): ROBOTIC ASSISTED LAPAROSCOPIC RIGHT NEPHRECTOMY (Right)  Patient Location: PACU  Anesthesia Type:General  Level of Consciousness: awake, alert  and oriented  Airway & Oxygen Therapy: Patient Spontanous Breathing and Patient connected to face mask oxygen  Post-op Assessment: Report given to RN and Post -op Vital signs reviewed and stable  Post vital signs: Reviewed and stable  Last Vitals:  Filed Vitals:   02/01/15 0959  BP: 174/66  Pulse:   Temp:   Resp:     Complications: No apparent anesthesia complications

## 2015-02-01 NOTE — Anesthesia Postprocedure Evaluation (Signed)
  Anesthesia Post-op Note  Patient: Tara Ramos  Procedure(s) Performed: Procedure(s) (LRB): ROBOTIC ASSISTED LAPAROSCOPIC RIGHT NEPHRECTOMY (Right)  Patient Location: PACU  Anesthesia Type: General  Level of Consciousness: awake and alert   Airway and Oxygen Therapy: Patient Spontanous Breathing  Post-op Pain: mild  Post-op Assessment: Post-op Vital signs reviewed, Patient's Cardiovascular Status Stable, Respiratory Function Stable, Patent Airway and No signs of Nausea or vomiting  Last Vitals:  Filed Vitals:   02/01/15 1745  BP: 143/77  Pulse: 108  Temp:   Resp: 14    Post-op Vital Signs: stable   Complications: No apparent anesthesia complications

## 2015-02-02 DIAGNOSIS — I9789 Other postprocedural complications and disorders of the circulatory system, not elsewhere classified: Secondary | ICD-10-CM

## 2015-02-02 DIAGNOSIS — I4891 Unspecified atrial fibrillation: Secondary | ICD-10-CM

## 2015-02-02 LAB — BASIC METABOLIC PANEL
Anion gap: 7 (ref 5–15)
BUN: 14 mg/dL (ref 6–20)
CO2: 28 mmol/L (ref 22–32)
Calcium: 8.4 mg/dL — ABNORMAL LOW (ref 8.9–10.3)
Chloride: 103 mmol/L (ref 101–111)
Creatinine, Ser: 1.33 mg/dL — ABNORMAL HIGH (ref 0.44–1.00)
GFR calc Af Amer: 42 mL/min — ABNORMAL LOW (ref >60)
GFR, EST NON AFRICAN AMERICAN: 36 mL/min — AB (ref >60)
GLUCOSE: 139 mg/dL — AB (ref 65–99)
POTASSIUM: 3.6 mmol/L (ref 3.5–5.1)
Sodium: 138 mmol/L (ref 135–145)

## 2015-02-02 LAB — MAGNESIUM: MAGNESIUM: 1.9 mg/dL (ref 1.7–2.4)

## 2015-02-02 LAB — HEMOGLOBIN AND HEMATOCRIT, BLOOD
HCT: 39.4 % (ref 36.0–46.0)
Hemoglobin: 12.4 g/dL (ref 12.0–15.0)

## 2015-02-02 MED ORDER — LETROZOLE 2.5 MG PO TABS: 2.5000 mg | ORAL_TABLET | ORAL | Status: DC

## 2015-02-02 NOTE — Discharge Summary (Signed)
Date of admission: 02/01/2015  Date of discharge: 02/02/2015  Admission diagnosis: Right renal mass  Discharge diagnosis: Same  Secondary diagnoses: none  History and Physical: For full details, please see admission history and physical. Briefly, Tara Ramos is a 81 y.o. year old patient with a right renal mass here for radical nephrectomy.   Hospital Course: The patient underwent right robotic radical nephrectomy on 02/01/15. The procedure was uneventful. The patient went into a-fib in PACU but this resolved spontaneously. Cardiology made outpatient rescs and the patient plans to see here PCP about arranging the appropriate testing. No new meds were recommended except 81 mg ASA. The patient was transferred to a floor bed and did well overnight. On POD#1 her foley was removed, she voided spontaneously, tolerated a diet, ambulated and had her pain controlled. She was discharged later on POD#1.  Laboratory values:  Recent Labs  02/01/15 1608 02/02/15 0503  HGB 14.0 12.4  HCT 42.8 39.4    Recent Labs  02/02/15 0503  CREATININE 1.33*    Disposition: Home  Discharge instruction: The patient was instructed to be ambulatory but told to refrain from heavy lifting, strenuous activity, or driving.   Discharge medications:    Medication List    STOP taking these medications        CENTRUM SILVER ULTRA WOMENS PO     VIACTIV [phone] MG-UNT-MCG Chew  Generic drug:  Calcium-Vitamin D-Vitamin K      TAKE these medications        amLODipine 5 MG tablet  Commonly known as:  NORVASC  Take 7.5 mg by mouth every morning.     docusate sodium 100 MG capsule  Commonly known as:  COLACE  Take 100 mg by mouth at bedtime.     hydrochlorothiazide 25 MG tablet  Commonly known as:  HYDRODIURIL  Take 50 mg by mouth daily.     HYDROcodone-acetaminophen 5-325 MG tablet  Commonly known as:  NORCO  Take 1-2 tablets by mouth every 6 (six) hours as needed for moderate pain.     letrozole  2.5 MG tablet  Commonly known as:  FEMARA  Take 1 tablet (2.5 mg total) by mouth every morning.     losartan 50 MG tablet  Commonly known as:  COZAAR  Take 50 mg by mouth every morning.     metoprolol 100 MG tablet  Commonly known as:  LOPRESSOR  Take 100 mg by mouth 2 (two) times daily.     omeprazole 20 MG capsule  Commonly known as:  PRILOSEC  Take 20 mg by mouth every morning.        Followup:      Follow-up Information    Follow up with Cleon Gustin, MD On 02/11/2015.   Specialty:  Urology   Why:  at 10:00   Contact information:   [address] [place] [address] [phone]

## 2015-02-02 NOTE — Progress Notes (Signed)
Patient attempted to use bathroom. Stated she only went a few drops. All urine had been absorbed by small amount of toilet tissue. Educated not put toilet tissue in hat. Will continue to encourage fluids, patient still DTV Tara Ramos. Brigitte Pulse, RN

## 2015-02-02 NOTE — Progress Notes (Signed)
Patient urinated 150 ml. Discharge instruction gone over. Family on the way to pick her up. Will continue to monitor.

## 2015-02-02 NOTE — Care Management Note (Signed)
Case Management Note  Patient Details  Name: Tara Ramos MRN: [id number] Date of Birth: May 07, 1931  Subjective/Objective:      right robotic radical nephrectomy on 02/01/15              Action/Plan: Chart reviewed. No NCM needs identified.    Expected Discharge Date:  02/02/2015               Expected Discharge Plan:  Home/Self Care  In-House Referral:     Discharge planning Services  CM Consult   Status of Service:  Completed, signed off  Medicare Important Message Given:    Date Medicare IM Given:    Medicare IM give by:    Date Additional Medicare IM Given:    Additional Medicare Important Message give by:     If discussed at [place] of Stay Meetings, dates discussed:    Additional Comments:  Erenest Rasher, RN 02/02/2015, 1:14 PM

## 2015-02-02 NOTE — Progress Notes (Signed)
Patient ID: Tara Ramos, female   DOB: 1932-02-10, 81 y.o.   MRN: [id number]         Subjective:    No palpitaitons, no chest pain, no SOB  Objective:   Temp:  [97.3 F (36.3 C)-99.2 F (37.3 C)] 99.2 F (37.3 C) (10/08 0516) Pulse Rate:  [46-109] 51 (10/08 0516) Resp:  [11-20] 18 (10/08 0516) BP: (119-202)/(51-108) 119/51 mmHg (10/08 0516) SpO2:  [96 %-100 %] 99 % (10/08 0516) Weight:  [165 lb (74.844 kg)] 165 lb (74.844 kg) (10/07 0928) Last BM Date: 01/31/15  Filed Weights   02/01/15 0928  Weight: 165 lb (74.844 kg)    Intake/Output Summary (Last 24 hours) at 02/02/15 0738 Last data filed at 02/02/15 0700  Gross per 24 hour  Intake   4935 ml  Output    600 ml  Net   4335 ml    Telemetry: sinus brady  Exam:  General: NAD  Resp: CTAB  Cardiac: regular, rate 50, no m/r/g, no jvd  GI: abdomen soft, NT, ND  MSK: no LE edema  Neuro: no focal deficits  PsychL: appropriate affect  Lab Results:  Basic Metabolic Panel:  Recent Labs Lab 02/02/15 0503  NA 138  K 3.6  CL 103  CO2 28  GLUCOSE 139*  BUN 14  CREATININE 1.33*  CALCIUM 8.4*    Liver Function Tests: No results for input(s): AST, ALT, ALKPHOS, BILITOT, PROT, ALBUMIN in the last 168 hours.  CBC:  Recent Labs Lab 02/01/15 1608 02/02/15 0503  HGB 14.0 12.4  HCT 42.8 39.4    Cardiac Enzymes: No results for input(s): CKTOTAL, CKMB, CKMBINDEX, TROPONINI in the last 168 hours.  BNP: No results for input(s): PROBNP in the last 8760 hours.  Coagulation: No results for input(s): INR in the last 168 hours.  ECG:   Medications:   Scheduled Medications: . amLODipine  7.5 mg Oral Daily  . hydrochlorothiazide  50 mg Oral Daily  . Influenza vac split quadrivalent PF  0.5 mL Intramuscular Tomorrow-1000  . letrozole  2.5 mg Oral Daily  . losartan  50 mg Oral Daily  . metoprolol  100 mg Oral BID  . pantoprazole  40 mg Oral Daily     Infusions: . dextrose 5 % and 0.45% NaCl  100 mL/hr at 02/02/15 0626     PRN Medications:  acetaminophen, diphenhydrAMINE **OR** diphenhydrAMINE, HYDROcodone-acetaminophen, HYDROmorphone (DILAUDID) injection, menthol-cetylpyridinium, ondansetron, phenol     Assessment/Plan   1. Postop afib - transient episode now resolved. No known prior history of afib - no indication for anticoag at this time unless recurrent afib.  - can consider outpatient 30 day monitor as outpatient - sinus bradycardia low 50s asymptomatic, will follow today. May down titrate beta blocker if progresses or symptomatic   2. Abnormal EKG - notes ST/T changes in setting of tachycardia - can consider outpatient Lexiscan, no urgent indication for inpatient study.   3. HTN - bp in controlled this AM, continue current meds - defer dosing and use of ARB and diuretic in setting of nephrectomy to urology   Will follow peripherally over the weekend including telemetry.   Arnoldo Lenis, M.D.

## 2015-02-02 NOTE — Progress Notes (Signed)
Bladder scan = [place] Brigitte Pulse, RN

## 2015-02-19 ENCOUNTER — Encounter: Payer: Self-pay | Admitting: Oncology

## 2015-02-19 ENCOUNTER — Inpatient Hospital Stay: Payer: PPO | Attending: Oncology | Admitting: Oncology

## 2015-02-19 VITALS — BP 161/86 | HR 51 | Temp 97.6°F | Wt 165.8 lb

## 2015-02-19 DIAGNOSIS — Z170 Estrogen receptor positive status [ER+]: Secondary | ICD-10-CM | POA: Diagnosis not present

## 2015-02-19 DIAGNOSIS — Z79811 Long term (current) use of aromatase inhibitors: Secondary | ICD-10-CM | POA: Diagnosis not present

## 2015-02-19 DIAGNOSIS — C50911 Malignant neoplasm of unspecified site of right female breast: Secondary | ICD-10-CM

## 2015-02-19 DIAGNOSIS — K219 Gastro-esophageal reflux disease without esophagitis: Secondary | ICD-10-CM | POA: Insufficient documentation

## 2015-02-19 DIAGNOSIS — Z853 Personal history of malignant neoplasm of breast: Secondary | ICD-10-CM

## 2015-02-19 DIAGNOSIS — N189 Chronic kidney disease, unspecified: Secondary | ICD-10-CM

## 2015-02-19 DIAGNOSIS — N2889 Other specified disorders of kidney and ureter: Secondary | ICD-10-CM | POA: Diagnosis not present

## 2015-02-19 DIAGNOSIS — D0510 Intraductal carcinoma in situ of unspecified breast: Secondary | ICD-10-CM | POA: Insufficient documentation

## 2015-02-19 DIAGNOSIS — E079 Disorder of thyroid, unspecified: Secondary | ICD-10-CM

## 2015-02-19 DIAGNOSIS — Z923 Personal history of irradiation: Secondary | ICD-10-CM | POA: Diagnosis not present

## 2015-02-19 DIAGNOSIS — Z905 Acquired absence of kidney: Secondary | ICD-10-CM | POA: Diagnosis not present

## 2015-02-19 DIAGNOSIS — I129 Hypertensive chronic kidney disease with stage 1 through stage 4 chronic kidney disease, or unspecified chronic kidney disease: Secondary | ICD-10-CM | POA: Diagnosis not present

## 2015-02-19 DIAGNOSIS — Z87891 Personal history of nicotine dependence: Secondary | ICD-10-CM | POA: Insufficient documentation

## 2015-02-19 DIAGNOSIS — Z79899 Other long term (current) drug therapy: Secondary | ICD-10-CM | POA: Diagnosis not present

## 2015-02-19 DIAGNOSIS — C649 Malignant neoplasm of unspecified kidney, except renal pelvis: Secondary | ICD-10-CM | POA: Diagnosis not present

## 2015-02-19 NOTE — Progress Notes (Signed)
[place] @ [facility] [phone]  Fax:([phone]) [place]: August 01, 1931  MR#: [id number]  EXB#:[id number]  Patient Care Team: Adin Hector, MD as PCP - General (Internal Medicine) Robert Bellow, MD (General Surgery) Clent Jacks, RN as Registered Nurse Hollice Espy, MD as Consulting Physician (Urology) Hessie Knows, MD as Consulting Physician (Orthopedic Surgery)  CHIEF COMPLAINT:  Chief Complaint  Patient presents with  . OTHER    Oncology History   Impression:   1.cT scan has been reviewed independently sows a complex 3 x 4 cm mass on the right kidney suspicious for renal cancer.  There are no enlarged lymph nodes.  There is small but indeterminate mass in the left kidney. 2.  Previous history of carcinoma breast.  In 1990 patient had lumpectomyin the left breast followed by radiation therapy.  In 1992 lumpectomy and right breast followed by radiation therapy.  In 2014 patient had a local recurrent disease and had lumpectomy on the right breast patient has been started on Femara. By Psychologist, sport and exercise.  Patient has taken tamoxifen after 1992 Patient has multiple kidney mass is in both kidney.  On CT and MRI scan.  I reviewed all the CT scan and MRI scan with the patient and case also was discussed in tumor conference I also discussed situation with Dr. Rayburn Ma as well as her interventional radiologist.  Possibility of either observation versus radiofrequency or cryoablation off small to masses seen in the right and left kidney and watch 1 complex cystic mass had been discussed.  Patient had been referred then to urologist Dr. Rayburn Ma patient   does have T7 compression fracture as well as thyroid nodule which will have to be taken into consideration if we decide to go for    nephrectomy on the right side.      3.  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  Oncology Flowsheet 11/01/2014 02/01/2015  02/02/2015  letrozole (FEMARA) PO 2.5 mg 2.5 mg 2.5 mg  ondansetron (ZOFRAN) IJ - - -  promethazine (PHENERGAN) IV - 6.25 mg -    INTERVAL HISTORY: 81 year old lady came today further follow-up patient had a right nephrectomy.  Tolerated procedure very well. Patient still has abnormality of the left kidney which is being evaluated by urologist.  Has a history of breast cancer on letrozole or tolerating very well.  Here for further follow-up and treatment consideration No bony pains.  Appetite has been stable. REVIEW OF SYSTEMS:   Gen. Status: Patient is apprehensive.HEENT: No headache.  No hearing loss.  No ear pain.  No nosebleed or congestion.  No sore throat.  No difficulty swallowing Respiratory system: No cough.  No hemoptysis.  No shortness of breath at rest or exertion.  No chest pain. Musculoskeletal system low back pain Cardiovascular system: No chest pain.  No palpitation.  No paroxysmal nocturnal dyspnea. Gastro intestinal system: No heartburn.  No nausea or vomiting.  No abdominal pain.  No diarrhea.  No constipation.  No rectal bleeding. Skin: No evidence of ecchymosis or rash. -Neurological system: No dizziness.  No tingling.  His was.  no tingling numbness.  no focal weakness or any focal signs. As per HPI. Otherwise, a complete review of systems is negatve.  PAST MEDICAL HISTORY: Past Medical History  Diagnosis Date  . Hemorrhoids   . Arthritis   . Status post radiation therapy     breast cancer  bilateral  . GERD (gastroesophageal reflux disease)   . Hypertension   . Chronic kidney disease   . Heart murmur     hx of years ago   . 174.4 12/16/2012    right and left breast cancer     PAST SURGICAL HISTORY: Past Surgical History  Procedure Laterality Date  . Other surgical history      radiation therapy breast  . Breast lumpectomy Left 1990  . Breast lumpectomy Right 1992  . Breast biopsy Right 2014    ATYPICAL DUCTAL PROLIFERATION WITH MARKED DISTORTION  BY  . Eye surgery Bilateral     cataract extraction  . Dilation and curettage of uterus    . Kyphoplasty N/A 10/09/2014    Procedure: KYPHOPLASTY;  Surgeon: Hessie Knows, MD;  Location: [facility] ORS;  Service: Orthopedics;  Laterality: N/A;  T7 Kyphoplasty with bone biopsy  . Robot assisted laparoscopic nephrectomy Right 02/01/2015    Procedure: ROBOTIC ASSISTED LAPAROSCOPIC RIGHT NEPHRECTOMY;  Surgeon: Cleon Gustin, MD;  Location: [facility]S;  Service: Urology;  Laterality: Right;    FAMILY HISTORY Family History  Problem Relation Age of Onset  . Stroke Father   . Stomach cancer Mother   . Brain cancer Sister     ADVANCED DIRECTIVES:  No flowsheet data found.  HEALTH MAINTENANCE: Social History  Substance Use Topics  . Smoking status: Former Smoker -- 20 years    Types: Cigarettes    Quit date: 04/27/1972  . Smokeless tobacco: Never Used  . Alcohol Use: 0.6 oz/week    1 Glasses of wine per week     Comment: 1 glass of wine with dinner occ      Allergies  Allergen Reactions  . Lisinopril Cough  . Morphine And Related Nausea And Vomiting  . Other     Adhesive tape- causes blister     Current Outpatient Prescriptions  Medication Sig Dispense Refill  . amLODipine (NORVASC) 5 MG tablet Take 7.5 mg by mouth every morning.    . docusate sodium (COLACE) 100 MG capsule Take 100 mg by mouth at bedtime.    . hydrochlorothiazide (HYDRODIURIL) 25 MG tablet Take 50 mg by mouth daily.    Marland Kitchen HYDROcodone-acetaminophen (NORCO) 5-325 MG tablet Take 1-2 tablets by mouth every 6 (six) hours as needed for moderate pain. 30 tablet 0  . letrozole (FEMARA) 2.5 MG tablet Take 1 tablet (2.5 mg total) by mouth every morning. 30 tablet 12  . losartan (COZAAR) 50 MG tablet Take 50 mg by mouth every morning.    . metoprolol (LOPRESSOR) 100 MG tablet Take 100 mg by mouth 2 (two) times daily.    Marland Kitchen omeprazole (PRILOSEC) 20 MG capsule Take 20 mg by mouth every morning.     . Calcium Carb-Cholecalciferol  (CALCIUM + D3) 600-200 MG-UNIT TABS Take by mouth.     No current facility-administered medications for this visit.    OBJECTIVE:  Filed Vitals:   02/19/15 1240  BP: 161/86  Pulse: 51  Temp: 97.6 F (36.4 C)     Body mass index is 30.31 kg/(m^2).    ECOG FS:1 - Symptomatic but completely ambulatory  PHYSICAL EXAM: General  status: Performance status is good.  Patient has not lost significant weight HEENT: No evidence of stomatitis. Sclera and conjunctivae :: No jaundice.   pale looking. Lungs: Air  entry equal on both sides.  No rhonchi.  No rales.  Cardiac: Heart sounds are normal.  No pericardial rub.  No murmur. Lymphatic  system: Cervical, axillary, inguinal, lymph nodes not palpable GI: Abdomen is soft.  No ascites.  Liver spleen not palpable.  No tenderness.  Bowel sounds are within normal limit Lower extremity: No edema Neurological system: Higher functions, cranial nerves intact no evidence of peripheral neuropathy. Skin: No rash.  No ecchymosis.Marland Kitchen   LAB RESULTS:  No visits with results within 2 Day(s) from this visit. Latest known visit with results is:  Admission on 02/01/2015, Discharged on 02/02/2015  Component Date Value Ref Range Status  . WBC 01/24/2015 9.0  4.0 - 10.5 K/uL Final  . RBC 01/24/2015 5.35* 3.87 - 5.11 MIL/uL Final  . Hemoglobin 01/24/2015 15.3* 12.0 - 15.0 g/dL Final  . HCT 01/24/2015 46.7* 36.0 - 46.0 % Final  . MCV 01/24/2015 87.3  78.0 - 100.0 fL Final  . MCH 01/24/2015 28.6  26.0 - 34.0 pg Final  . MCHC 01/24/2015 32.8  30.0 - 36.0 g/dL Final  . RDW 01/24/2015 13.5  11.5 - 15.5 % Final  . Platelets 01/24/2015 307  150 - 400 K/uL Final  . Sodium 01/24/2015 139  135 - 145 mmol/L Final  . Potassium 01/24/2015 3.9  3.5 - 5.1 mmol/L Final  . Chloride 01/24/2015 101  101 - 111 mmol/L Final  . CO2 01/24/2015 30  22 - 32 mmol/L Final  . Glucose, Bld 01/24/2015 102* 65 - 99 mg/dL Final  . BUN 01/24/2015 28* 6 - 20 mg/dL Final  . Creatinine, Ser  01/24/2015 0.86  0.44 - 1.00 mg/dL Final  . Calcium 01/24/2015 9.9  8.9 - 10.3 mg/dL Final  . GFR calc non Af Amer 01/24/2015 >60  >60 mL/min Final  . GFR calc Af Amer 01/24/2015 >60  >60 mL/min Final   Comment: (NOTE) The eGFR has been calculated using the CKD EPI equation. This calculation has not been validated in all clinical situations. eGFR's persistently <60 mL/min signify possible Chronic Kidney Disease.   . Anion gap 01/24/2015 8  5 - 15 Final  . ABO/RH(D) 01/24/2015 O POS   Final  . Antibody Screen 01/24/2015 NEG   Final  . Sample Expiration 01/24/2015 02/04/2015   Final  . ABO/RH(D) 01/24/2015 O POS   Final  . Hemoglobin 02/01/2015 14.0  12.0 - 15.0 g/dL Final  . HCT 02/01/2015 42.8  36.0 - 46.0 % Final  . Sodium 02/02/2015 138  135 - 145 mmol/L Final  . Potassium 02/02/2015 3.6  3.5 - 5.1 mmol/L Final  . Chloride 02/02/2015 103  101 - 111 mmol/L Final  . CO2 02/02/2015 28  22 - 32 mmol/L Final  . Glucose, Bld 02/02/2015 139* 65 - 99 mg/dL Final  . BUN 02/02/2015 14  6 - 20 mg/dL Final  . Creatinine, Ser 02/02/2015 1.33* 0.44 - 1.00 mg/dL Final  . Calcium 02/02/2015 8.4* 8.9 - 10.3 mg/dL Final  . GFR calc non Af Amer 02/02/2015 36* >60 mL/min Final  . GFR calc Af Amer 02/02/2015 42* >60 mL/min Final   Comment: (NOTE) The eGFR has been calculated using the CKD EPI equation. This calculation has not been validated in all clinical situations. eGFR's persistently <60 mL/min signify possible Chronic Kidney Disease.   . Anion gap 02/02/2015 7  5 - 15 Final  . Hemoglobin 02/02/2015 12.4  12.0 - 15.0 g/dL Final  . HCT 02/02/2015 39.4  36.0 - 46.0 % Final  . Magnesium 02/02/2015 1.9  1.7 - 2.4 mg/dL Final      STUDIES: Pathology of the right kidney.  Right radical nephrectomy.  Clear cell renal carcinoma or centimeter in size margins are negative.  tUMOR   Is confined to the kidney.  No lymphovascular invasion seen. pT1a  pNx  M0 SAGE 1 ASSESSMENT: 1.CT scan of abdomen  and MRI scan of abdomen is suggestive of multifocal kidney masses.  More significant on the right side.  Right partial nephrectomy was planned. 2.previous history of carcinoma of breast  Recurrent disease in 2014 3.  Bone scan and MRI scan is abnormal particularly in T7 spine pathological fracture cannot be ruled out  MEDICAL DECISION MAKING:  1.  Status post right nephrectomy with stage I clear cell carcinoma . no further therapy recommended 2.  Patient does have mass in the left kidney which is indeterminate in nature being evaluated by urologist 3.  Carcinoma of breast patient is on letrozole Continue follow-up without any intervention All lab data has been reviewed All the records from [facility] has been reviewed including pathology and operative note Forest Gleason, MD   02/19/2015 12:54 PM

## 2015-03-01 ENCOUNTER — Encounter: Payer: Self-pay | Admitting: Oncology

## 2015-03-26 ENCOUNTER — Ambulatory Visit (INDEPENDENT_AMBULATORY_CARE_PROVIDER_SITE_OTHER): Payer: PPO | Admitting: Urology

## 2015-03-26 ENCOUNTER — Encounter: Payer: Self-pay | Admitting: Urology

## 2015-03-26 DIAGNOSIS — C649 Malignant neoplasm of unspecified kidney, except renal pelvis: Secondary | ICD-10-CM

## 2015-03-26 NOTE — Progress Notes (Signed)
1:09 PM  03/26/2015  [name] 1931/08/21 WN:[id number]  Referring provider: Adin Hector, MD [address] [place], [place] [address]  Chief Complaint  Patient presents with  . Renal Lesion    HPI: 81 year old with bilateral renal masses s/p right robotic nephrectomy in [place] by Dr. Alyson Ingles in 02/01/15.  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 The inferior pole lesion was not consistent with malignancy.  No issues postoperatively.  She returns today to discuss how to proceed with treatment of her left renal mass now that the right side has been treated.  Initial imaging by MRI to confirm tthe size and location of these lesions. She was noted to have a 4.5 x 2.8 x 2.2 cm RIGHT medial  enhancing renal mass along with a 1.6 cm enhancing inferior pole renal mass. On the LEFT, she was found to have a 1.9 cm LEFT enhancing renal mass as well.    She does have a history of breast cancer, first diagnosed in 1990 status post lumpectomy and radiation. She developed recurrent disease in the same breast in 2014 at which time she underwent lumpectomy and no other therapy needed.  CT of the chest 08/17/14 showed no evidence for lung metastases but an incidental T7 compression fracture concerning was noted of which metastatic involvement not excluded. Bone scan was also somewhat concerning for pathological fracture. She most recently underwent a kyphoplasty with biopsy with was NEGATIVE for malignancy. She was also noted to have an incidental 11 mm left thyroid nodule.  Given her history of various malignancies and other radio graphic findings, she did undergo biopsy of the right upper pole renal lesion which was indeed consistent with RCC.  Creatinine on 07/2014 0.96 preop.    PMH: Past Medical History  Diagnosis Date  . Hemorrhoids   . Arthritis   . Status post radiation therapy     breast  cancer bilateral  . GERD (gastroesophageal reflux disease)   . Hypertension   . Chronic kidney disease   . Heart murmur     hx of years ago   . 174.4 12/16/2012    right and left breast cancer     Surgical History: Past Surgical History  Procedure Laterality Date  . Other surgical history      radiation therapy breast  . Breast lumpectomy Left 1990  . Breast lumpectomy Right 1992  . Breast biopsy Right 2014    ATYPICAL DUCTAL PROLIFERATION WITH MARKED DISTORTION BY  . Eye surgery Bilateral     cataract extraction  . Dilation and curettage of uterus    . Kyphoplasty N/A 10/09/2014    Procedure: KYPHOPLASTY;  Surgeon: Hessie Knows, MD;  Location: [facility] ORS;  Service: Orthopedics;  Laterality: N/A;  T7 Kyphoplasty with bone biopsy  . Robot assisted laparoscopic nephrectomy Right 02/01/2015    Procedure: ROBOTIC ASSISTED LAPAROSCOPIC RIGHT NEPHRECTOMY;  Surgeon: Cleon Gustin, MD;  Location: [facility];  Service: Urology;  Laterality: Right;    Home Medications:    Medication List       This list is accurate as of: 03/26/15  1:09 PM.  Always use your most recent med list.               amLODipine 5 MG tablet  Commonly known as:  NORVASC  Take 7.5 mg by mouth every morning.     Calcium + D3 600-200  MG-UNIT Tabs  Take by mouth.     docusate sodium 100 MG capsule  Commonly known as:  COLACE  Take 100 mg by mouth at bedtime.     hydrochlorothiazide 25 MG tablet  Commonly known as:  HYDRODIURIL  Take 50 mg by mouth daily.     HYDROcodone-acetaminophen 5-325 MG tablet  Commonly known as:  NORCO  Take 1-2 tablets by mouth every 6 (six) hours as needed for moderate pain.     letrozole 2.5 MG tablet  Commonly known as:  FEMARA  Take 1 tablet (2.5 mg total) by mouth every morning.     losartan 50 MG tablet  Commonly known as:  COZAAR  Take 50 mg by mouth every morning.     metoprolol 100 MG tablet  Commonly known as:  LOPRESSOR  Take 100 mg by mouth 2 (two) times  daily.     omeprazole 20 MG capsule  Commonly known as:  PRILOSEC  Take 20 mg by mouth every morning.        Allergies:  Allergies  Allergen Reactions  . Lisinopril Cough  . Morphine And Related Nausea And Vomiting  . Other     Adhesive tape- causes blister     Family History: Family History  Problem Relation Age of Onset  . Stroke Father   . Stomach cancer Mother   . Brain cancer Sister     Social History:  reports that she quit smoking about 42 years ago. Her smoking use included Cigarettes. She quit after 20 years of use. She has never used smokeless tobacco. She reports that she drinks about 0.6 oz of alcohol per week. She reports that she does not use illicit drugs.   Physical Exam: BP 183/81 mmHg  Pulse 55  Ht 5\' 2"  (1.575 m)  Wt 164 lb 1.6 oz (74.435 kg)  BMI 30.01 kg/m2  Constitutional:  Alert and oriented, No acute distress. HEENT: [place] AT, moist mucus membranes.  Trachea midline, no masses. Cardiovascular: No clubbing, cyanosis, or edema. Respiratory: Normal respiratory effort, no increased work of breathing. GI: Abdomen is soft, nontender, nondistended, no abdominal masses GU: No CVA tenderness. Right lower quadrant incision well-healed. Protopic scars also well-healed. Skin: No rashes, bruises or suspicious lesions. Neurologic: Grossly intact, no focal deficits, moving all 4 extremities. Psychiatric: Normal mood and affect.  Laboratory Data: Lab Results  Component Value Date   WBC 9.0 01/24/2015   HGB 12.4 02/02/2015   HCT 39.4 02/02/2015   MCV 87.3 01/24/2015   PLT 307 01/24/2015    Lab Results  Component Value Date   CREATININE 1.33* 02/02/2015     Assessment & Plan:   1. Right renal mass x 2 -s/p right robotic nephrectomy in 01/2015 -Check BMP/CBC today  2. Left renal mass x 1 -Today we discussed at length options on how to manage her left renal mass. We discussed continued surveillance of the small renal mass versus cryotherapy versus  laparoscopic partial nephrectomy. Risk and benefits of procedures were discussed and she is anxious to have the mass treated. We discussed that given her solitary kidney now, she is at higher risk for renal compromise. I do feel that the lesion is in an ideal location for percutaneous intervention which would be minimally invasive.  She is most interested in this option as well. -Referral to interventional radiology [facility] radiology) to discuss possible cryotherapy of left enhancing renal mass in a solitary kidney  Plan for follow-up in 6 months likely with repeat  imaging if not ordered by interventional radiology prior to that.   Hollice Espy, MD  [place] [address] [place], [place] [address] [phone]   I spent 25 min with this patient of which greater than 50% was spent in counseling and coordination of care with the patient.

## 2015-03-27 LAB — CBC WITH DIFFERENTIAL/PLATELET
BASOS ABS: 0 10*3/uL (ref 0.0–0.2)
Basos: 1 %
EOS (ABSOLUTE): 0.3 10*3/uL (ref 0.0–0.4)
Eos: 3 %
Hematocrit: 44.6 % (ref 34.0–46.6)
Hemoglobin: 14.8 g/dL (ref 11.1–15.9)
IMMATURE GRANULOCYTES: 0 %
Immature Grans (Abs): 0 10*3/uL (ref 0.0–0.1)
LYMPHS ABS: 1.3 10*3/uL (ref 0.7–3.1)
Lymphs: 16 %
MCH: 29 pg (ref 26.6–33.0)
MCHC: 33.2 g/dL (ref 31.5–35.7)
MCV: 88 fL (ref 79–97)
MONOS ABS: 0.7 10*3/uL (ref 0.1–0.9)
Monocytes: 8 %
NEUTROS PCT: 72 %
Neutrophils Absolute: 5.9 10*3/uL (ref 1.4–7.0)
PLATELETS: 313 10*3/uL (ref 150–379)
RBC: 5.1 x10E6/uL (ref 3.77–5.28)
RDW: 14.4 % (ref 12.3–15.4)
WBC: 8.2 10*3/uL (ref 3.4–10.8)

## 2015-03-27 LAB — COMPREHENSIVE METABOLIC PANEL
ALK PHOS: 69 IU/L (ref 39–117)
ALT: 13 IU/L (ref 0–32)
AST: 14 IU/L (ref 0–40)
Albumin/Globulin Ratio: 1.9 (ref 1.1–2.5)
Albumin: 4.1 g/dL (ref 3.5–4.7)
BUN/Creatinine Ratio: 21 (ref 11–26)
BUN: 36 mg/dL — AB (ref 8–27)
Bilirubin Total: 0.6 mg/dL (ref 0.0–1.2)
CALCIUM: 10.4 mg/dL — AB (ref 8.7–10.3)
CO2: 30 mmol/L — AB (ref 18–29)
CREATININE: 1.69 mg/dL — AB (ref 0.57–1.00)
Chloride: 97 mmol/L (ref 97–106)
GFR calc Af Amer: 32 mL/min/{1.73_m2} — ABNORMAL LOW (ref >59)
GFR, EST NON AFRICAN AMERICAN: 28 mL/min/{1.73_m2} — AB (ref >59)
GLUCOSE: 97 mg/dL (ref 65–99)
Globulin, Total: 2.2 g/dL (ref 1.5–4.5)
Potassium: 4.5 mmol/L (ref 3.5–5.2)
SODIUM: 141 mmol/L (ref 136–144)
Total Protein: 6.3 g/dL (ref 6.0–8.5)

## 2015-03-29 ENCOUNTER — Other Ambulatory Visit: Payer: Self-pay | Admitting: Urology

## 2015-03-29 DIAGNOSIS — C642 Malignant neoplasm of left kidney, except renal pelvis: Secondary | ICD-10-CM

## 2015-04-11 ENCOUNTER — Ambulatory Visit
Admission: RE | Admit: 2015-04-11 | Discharge: 2015-04-11 | Disposition: A | Discharge status: Home / Self Care | Payer: PPO | Source: Ambulatory Visit | Attending: Urology | Admitting: Urology

## 2015-04-11 DIAGNOSIS — C642 Malignant neoplasm of left kidney, except renal pelvis: Secondary | ICD-10-CM

## 2015-04-11 NOTE — Consult Note (Signed)
Chief Complaint: Patient was seen in consultation today for left renal mass at the request of [place]  Referring Physician(s): Brandon,Ashley  History of Present Illness: Tara Ramos is a 81 y.o. female who had bilateral renal lesions identified on a CT abdomen 08/01/2014 obtained for abdominal cramping and pain. Follow-up MR 08/17/2014 revealed a 4.5 cm right upper pole mass, smaller 19 mm right lower pole enhancing lesion, and a contralateral left 19 mm enhancing partially exophytic mid kidney lesion. She had core biopsy of the dominant right lesion 10/31/2014 demonstrating no cell  carcinoma. She underwent robotic right nephrectomy 02/01/2015, which was well tolerated. The smaller inferior pole lesion was benign. She now is considering treatment options for the left lesion, in the setting of solitary kidney.  Past Medical History  Diagnosis Date  . Hemorrhoids   . Arthritis   . Status post radiation therapy     breast cancer bilateral  . GERD (gastroesophageal reflux disease)   . Hypertension   . Chronic kidney disease   . Heart murmur     hx of years ago   . 174.4 12/16/2012    left, then right, then right breast cancers (lumpectomies and radiation therapy)    Past Surgical History  Procedure Laterality Date  . Other surgical history      radiation therapy breast  . Breast lumpectomy Left 1990  . Breast lumpectomy Right 1992  . Breast biopsy Right 2014    ATYPICAL DUCTAL PROLIFERATION WITH MARKED DISTORTION BY  . Eye surgery Bilateral     cataract extraction  . Dilation and curettage of uterus    . Kyphoplasty N/A 10/09/2014    Procedure: KYPHOPLASTY;  Surgeon: Hessie Knows, MD;  Location: [facility] ORS;  Service: Orthopedics;  Laterality: N/A;  T7 Kyphoplasty with bone biopsy  . Robot assisted laparoscopic nephrectomy Right 02/01/2015    Procedure: ROBOTIC ASSISTED LAPAROSCOPIC RIGHT NEPHRECTOMY;  Surgeon: Cleon Gustin, MD;  Location: [facility];  Service:  Urology;  Laterality: Right;    Allergies: Morphine and related; Other; and Lisinopril  Medications: Prior to Admission medications   Medication Sig Start Date End Date Taking? Authorizing Provider  amLODipine (NORVASC) 5 MG tablet Take 7.5 mg by mouth every morning.   Yes Historical Provider, MD  Calcium Carb-Cholecalciferol (CALCIUM + D3) 600-200 MG-UNIT TABS Take by mouth.   Yes Historical Provider, MD  docusate sodium (COLACE) 100 MG capsule Take 100 mg by mouth at bedtime.   Yes Historical Provider, MD  hydrochlorothiazide (HYDRODIURIL) 25 MG tablet Take 50 mg by mouth daily. 07/28/12  Yes Historical Provider, MD  letrozole (FEMARA) 2.5 MG tablet Take 1 tablet (2.5 mg total) by mouth every morning. 02/02/15  Yes Christell Faith, MD  losartan (COZAAR) 50 MG tablet Take 50 mg by mouth every morning.   Yes Historical Provider, MD  metoprolol (LOPRESSOR) 100 MG tablet Take 100 mg by mouth 2 (two) times daily.   Yes Historical Provider, MD  omeprazole (PRILOSEC) 20 MG capsule Take 20 mg by mouth every morning.  07/28/12  Yes Historical Provider, MD  HYDROcodone-acetaminophen (NORCO) 5-325 MG tablet Take 1-2 tablets by mouth every 6 (six) hours as needed for moderate pain. Patient not taking: Reported on 03/26/2015 02/01/15   Debbrah Alar, PA-C     Family History  Problem Relation Age of Onset  . Stroke Father   . Stomach cancer Mother   . Brain cancer Sister     Social History   Social History  .  Marital Status: Married    Spouse Name: N/A  . Number of Children: N/A  . Years of Education: N/A   Social History Main Topics  . Smoking status: Former Smoker -- 20 years    Types: Cigarettes    Quit date: 04/27/1972  . Smokeless tobacco: Never Used  . Alcohol Use: 0.6 oz/week    1 Glasses of wine per week     Comment: 1 glass of wine with dinner occ  . Drug Use: No  . Sexual Activity: Not Asked   Other Topics Concern  . None   Social History Narrative    ECOG Status: 0 -  Asymptomatic  Review of Systems: A 12 point ROS discussed and pertinent positives are indicated in the HPI above.  All other systems are negative.  Review of Systems  Vital Signs: BP 214/87 mmHg  Pulse 57  Temp(Src) 98 F (36.7 C)  Resp 16  SpO2 98%  Physical Exam  Mallampati Score:     Imaging: No results found.  Labs:  CBC:  Recent Labs  08/01/14 0053 10/31/14 0727  11/01/14 0514 01/24/15 1315 02/01/15 1608 02/02/15 0503 03/26/15 1047  WBC 15.5* 8.1  --   --  9.0  --   --  8.2  HGB 16.3* 14.5  < > 14.2 15.3* 14.0 12.4  --   HCT 50.5* 44.2  < > 43.6 46.7* 42.8 39.4 44.6  PLT 398 280  --   --  307  --   --   --   < > = values in this interval not displayed.  COAGS:  Recent Labs  10/31/14 0727  INR 0.85    BMP:  Recent Labs  08/01/14 0053 08/08/14 0940 10/08/14 1207 01/24/15 1315 02/02/15 0503 03/26/15 1047  NA 139  --   --  139 138 141  K 3.9  --  3.8 3.9 3.6 4.5  CL 98*  --   --  101 103 97  CO2 28  --   --  30 28 30*  GLUCOSE 149*  --   --  102* 139* 97  BUN 19  --   --  28* 14 36*  CALCIUM 9.6  --   --  9.9 8.4* 10.4*  CREATININE 1.01* 0.96  --  0.86 1.33* 1.69*  GFRNONAA 53* 56*  --  >60 36* 28*  GFRAA >60 >60  --  >60 42* 32*    LIVER FUNCTION TESTS:  Recent Labs  08/01/14 0053 03/26/15 1047  BILITOT 0.7 0.6  AST 22 14  ALT 19 13  ALKPHOS 80 69  PROT 7.4 6.3  ALBUMIN 4.1 4.1    TUMOR MARKERS: No results for input(s): AFPTM, CEA, CA199, CHROMGRNA in the last 8760 hours.  Assessment and Plan:  My impression is that the enhancing left renal lesion is certainly worrisome for small renal cell carcinoma. Given her history of contralateral renal cell carcinoma already, I would not require a separate biopsy of this small lesion, avoiding the additional risk. In the setting of previous right nephrectomy, nephron sparing options are ideal. The lesion's peripheral posterior location makes it quite approachable for percutaneous  ablation techniques. Retro-peritoneal fat effectively protects the diaphragm. No other significant proximal vessels or organs. Its peripheral location also minimizes risk of damage to the collecting system and vascular pedicle, and decrease his risk of significant heatsink. The lesion would be most amenable to percutaneous microwave ablation, with its predictable and reproducible ablation zone. I discussed with the  patient and her husband the natural history of renal cell carcinoma and treatment options including watchful waiting, percutaneous ablation, and resection. We discussed in detail the percutaneous ablation technique under CT with anesthesia, differences between cryoablation, RF and microwave; Details of the microwave ablation technique, anticipated benefits, possible risks and complications, and need for follow-up imaging surveillance.  They seemed to understand and did ask appropriate questions. She is motivated to proceed. Therefore, we can set this up at her convenience. Plan for overnight admission for observation although the patient is motivated to be discharged as an outpatient same day; we can see how she does post procedure.  Thank you for this interesting consult.  I greatly enjoyed meeting PEGI HEIBERGER and look forward to participating in their care.  A copy of this report was sent to the requesting provider on this date.  Signed: Ama Mcmaster III, DAYNE Emanuel Campos 04/11/2015, 10:40 AM   I spent a total of  40 Minutes   in face to face in clinical consultation, greater than 50% of which was counseling/coordinating care for left renal mass.

## 2015-04-14 DIAGNOSIS — Z8781 Personal history of (healed) traumatic fracture: Secondary | ICD-10-CM | POA: Insufficient documentation

## 2015-04-14 DIAGNOSIS — M4854XA Collapsed vertebra, not elsewhere classified, thoracic region, initial encounter for fracture: Secondary | ICD-10-CM | POA: Insufficient documentation

## 2015-04-15 DIAGNOSIS — N183 Chronic kidney disease, stage 3 unspecified: Secondary | ICD-10-CM | POA: Insufficient documentation

## 2015-04-16 ENCOUNTER — Other Ambulatory Visit: Payer: Self-pay | Admitting: Interventional Radiology

## 2015-04-16 DIAGNOSIS — N2889 Other specified disorders of kidney and ureter: Secondary | ICD-10-CM

## 2015-04-26 ENCOUNTER — Telehealth: Payer: Self-pay | Admitting: Radiology

## 2015-04-26 NOTE — Progress Notes (Signed)
Consulted Dr. Marcell Barlow, Anesthesia about patient's abnormal EKG from 02/01/2015 in [facility]. Dr. Marcell Barlow reviewed EKG from 02/01/2015 ,10/08/2014, and 10/09/2014 (all in [facility] along with consult note from Dr. Sallyanne Kuster who saw patient in hospital 02/01/2015. Per Anesthesia, Dr. Marcell Barlow, patient needs Cardiac workup prior to procedure in Interventional Radiology on 05/08/2015.  Spoke with Tsosie Billing, PA in Interventional Radiology who will follow-up on this.

## 2015-04-26 NOTE — Progress Notes (Unsigned)
Anesthesia Dr. Marcell Barlow has reviewed the patient's history for upcoming pre-op prior to microwave ablation of renal lesion scheduled for 05/08/2015 and patient will require cardiology appointment and clearance prior to moving forward. She was seen in the hospital on 02/02/2015 by cardiology for post operative atrial fibrillation and abnormal EKG and it was recommended at that time that the patient be considered to undergo additional testing such as 30 day cardiac monitor and cardiac stress test. I have called and spoke with the patient today and she has not seen a cardiologist. I told her the cardiologist recommendation during her admission in 01/2015 and that anesthesia will require a cardiology clearance prior to the scheduled procedure. She stated she will call Dr. Bethanne Ginger office and try to get an appointment.   Tsosie Billing PA-C Interventional Radiology  04/26/2015 1:23 PM

## 2015-04-30 ENCOUNTER — Other Ambulatory Visit: Payer: Self-pay | Admitting: Radiology

## 2015-05-03 ENCOUNTER — Inpatient Hospital Stay (HOSPITAL_COMMUNITY)
Admission: RE | Admit: 2015-05-03 | Discharge: 2015-05-03 | Disposition: A | Discharge status: Home / Self Care | Payer: PPO | Source: Ambulatory Visit

## 2015-05-06 ENCOUNTER — Encounter: Payer: Self-pay | Admitting: Cardiovascular Disease

## 2015-05-06 ENCOUNTER — Ambulatory Visit (INDEPENDENT_AMBULATORY_CARE_PROVIDER_SITE_OTHER): Payer: PPO | Admitting: Cardiovascular Disease

## 2015-05-06 VITALS — BP 178/82 | HR 52 | Ht 62.0 in | Wt 166.5 lb

## 2015-05-06 DIAGNOSIS — I4891 Unspecified atrial fibrillation: Secondary | ICD-10-CM | POA: Diagnosis not present

## 2015-05-06 DIAGNOSIS — I9789 Other postprocedural complications and disorders of the circulatory system, not elsewhere classified: Secondary | ICD-10-CM

## 2015-05-06 DIAGNOSIS — Z01810 Encounter for preprocedural cardiovascular examination: Secondary | ICD-10-CM | POA: Insufficient documentation

## 2015-05-06 DIAGNOSIS — I10 Essential (primary) hypertension: Secondary | ICD-10-CM | POA: Diagnosis not present

## 2015-05-06 NOTE — Progress Notes (Signed)
Primary care physician: Dr. Ramonita Lab.  HPI  This is a 81 y.o. female who was referred by interventional radiology for preoperative cardiovascular evaluation before microwave ablation of left renal mass. She has chronic medical conditions that include HTN, GERD, breast cancer (first diagnosed in 1990 status post lumpectomy and radiation; recurrent 2014, treated with lumpectomy and radiation), previous kyphoplasty T7, with bilateral renal masses, now s/p robotic assisted right nephrectomy for renal cell carcinoma, after which she developed atrial fibrillation with mild RVR, hemodynamically compensated.  She underwent right nephrectomy in October 2016. She had postoperative atrial fibrillation with rapid ventricular response. There was new mild ST segment depression and T wave inversion across the anterior precordium during atrial fibrillation with RVR. She was seen by Dr. Sallyanne Kuster for consultation. She converted to sinus rhythm without intervention. An outpatient stress test was recommended.  More than 5 years ago she had an echo and stress test ordered by Dr. Ubaldo Glassing at the [facility]. The results were "normal".  Note coronary calcification on CT April 2016.  Currently she denies any chest pain or significant shortness of breath. Blood pressure continues to be difficult to control and she did have some worsening of kidney function post nephrectomy. She denies orthopnea, PND or palpitations.  Allergies  Allergen Reactions  . Morphine And Related Nausea And Vomiting  . Tape Other (See Comments)    blisters  . Lisinopril Cough     Current Outpatient Prescriptions on File Prior to Visit  Medication Sig Dispense Refill  . amLODipine (NORVASC) 5 MG tablet Take 7.5 mg by mouth every morning.    . Ascorbic Acid (VITAMIN C) 1000 MG tablet Take 1,000 mg by mouth daily.    . Calcium Carb-Cholecalciferol (CALCIUM + D3) 600-200 MG-UNIT TABS Take 1 tablet by mouth daily.     Marland Kitchen docusate sodium  (COLACE) 100 MG capsule Take 100 mg by mouth at bedtime.    Marland Kitchen GLUCOSAMINE HCL PO Take 1,000 mg by mouth daily.    . hydrochlorothiazide (HYDRODIURIL) 25 MG tablet Take 50 mg by mouth daily.    Marland Kitchen letrozole (FEMARA) 2.5 MG tablet Take 1 tablet (2.5 mg total) by mouth every morning. 30 tablet 12  . losartan (COZAAR) 50 MG tablet Take 50 mg by mouth every morning.    . metoprolol (LOPRESSOR) 100 MG tablet Take 100 mg by mouth 2 (two) times daily.    . Multiple Vitamin (MULTIVITAMIN WITH MINERALS) TABS tablet Take 1 tablet by mouth daily.    Marland Kitchen omeprazole (PRILOSEC) 20 MG capsule Take 20 mg by mouth every morning.      No current facility-administered medications on file prior to visit.     Past Medical History  Diagnosis Date  . Hemorrhoids   . Arthritis   . Status post radiation therapy     breast cancer bilateral  . GERD (gastroesophageal reflux disease)   . Hypertension   . Chronic kidney disease   . Heart murmur     hx of years ago   . 174.4 12/16/2012    left, then right, then right breast cancers (lumpectomies and radiation therapy)     Past Surgical History  Procedure Laterality Date  . Other surgical history      radiation therapy breast  . Breast lumpectomy Left 1990  . Breast lumpectomy Right 1992  . Breast biopsy Right 2014    ATYPICAL DUCTAL PROLIFERATION WITH MARKED DISTORTION BY  . Eye surgery Bilateral     cataract extraction  .  Dilation and curettage of uterus    . Kyphoplasty N/A 10/09/2014    Procedure: KYPHOPLASTY;  Surgeon: Hessie Knows, MD;  Location: [facility] ORS;  Service: Orthopedics;  Laterality: N/A;  T7 Kyphoplasty with bone biopsy  . Robot assisted laparoscopic nephrectomy Right 02/01/2015    Procedure: ROBOTIC ASSISTED LAPAROSCOPIC RIGHT NEPHRECTOMY;  Surgeon: Cleon Gustin, MD;  Location: [facility];  Service: Urology;  Laterality: Right;     Family History  Problem Relation Age of Onset  . Stroke Father   . Stomach cancer Mother   . Brain cancer  Sister      Social History   Social History  . Marital Status: Married    Spouse Name: N/A  . Number of Children: N/A  . Years of Education: N/A   Occupational History  . Not on file.   Social History Main Topics  . Smoking status: Former Smoker -- 20 years    Types: Cigarettes    Quit date: 04/27/1972  . Smokeless tobacco: Never Used  . Alcohol Use: 0.6 oz/week    1 Glasses of wine per week     Comment: 1 glass of wine with dinner occ  . Drug Use: No  . Sexual Activity: Not on file   Other Topics Concern  . Not on file   Social History Narrative      PHYSICAL EXAM   BP 178/82 mmHg  Pulse 52  Ht 5\' 2"  (1.575 m)  Wt 166 lb 8 oz (75.524 kg)  BMI 30.45 kg/m2 Constitutional: She is oriented to person, place, and time. She appears well-developed and well-nourished. No distress.  HENT: No nasal discharge.  Head: Normocephalic and atraumatic.  Eyes: Pupils are equal and round. No discharge.  Neck: Normal range of motion. Neck supple. No JVD present. No thyromegaly present.  Cardiovascular: Normal rate, regular rhythm, normal heart sounds. Exam reveals no gallop and no friction rub. No murmur heard.  Pulmonary/Chest: Effort normal and breath sounds normal. No stridor. No respiratory distress. She has no wheezes. She has no rales. She exhibits no tenderness.  Abdominal: Soft. Bowel sounds are normal. She exhibits no distension. There is no tenderness. There is no rebound and no guarding.  Musculoskeletal: Normal range of motion. She exhibits no edema and no tenderness.  Neurological: She is alert and oriented to person, place, and time. Coordination normal.  Skin: Skin is warm and dry. No rash noted. She is not diaphoretic. No erythema. No pallor.  Psychiatric: She has a normal mood and affect. Her behavior is normal. Judgment and thought content normal.     EKG: Sinus bradycardia with first-degree AV block. Poor R-wave progression in the anterior  leads.   ASSESSMENT AND PLAN

## 2015-05-06 NOTE — Assessment & Plan Note (Signed)
This was brief post operatively with no evidence of recurrent episodes. Continue current dose of metoprolol which should not be held throughout the perioperative period. Unless she has recurrent episodes of atrial fibrillation, anticoagulation is not recommended at the present time.

## 2015-05-06 NOTE — Assessment & Plan Note (Signed)
Blood pressure continues to be elevated on multiple blood pressure medications. I elected not to increase losartan at the present time given the worsening renal function post right nephrectomy. Hydralazine can be considered in the future if needed.

## 2015-05-06 NOTE — Assessment & Plan Note (Signed)
The patient has no previous history of ischemic heart disease. She has no convincing symptoms of angina and her exercise capacity is reasonable. She had postoperative atrial fibrillation in October associated with new ST depression. Thus, I think it's important to exclude underlying coronary artery disease. Thus, I requested a pharmacologic nuclear stress test. If this comes back normal, the patient can proceed with an overall low risk from a cardiac standpoint.

## 2015-05-06 NOTE — Patient Instructions (Addendum)
Medication Instructions:  Your physician recommends that you continue on your current medications as directed. Please refer to the Current Medication list given to you today.   Labwork: none  Testing/Procedures: Your physician has requested that you have a lexiscan myoview. For further information please visit [email or web address]. Please follow instruction sheet, as given.  Tara Ramos  Your caregiver has ordered a Stress Test with nuclear imaging. The purpose of this test is to evaluate the blood supply to your heart muscle. This procedure is referred to as a "Non-Invasive Stress Test." This is because other than having an IV started in your vein, nothing is inserted or "invades" your body. Cardiac stress tests are done to find areas of poor blood flow to the heart by determining the extent of coronary artery disease (CAD). Some patients exercise on a treadmill, which naturally increases the blood flow to your heart, while others who are  unable to walk on a treadmill due to physical limitations have a pharmacologic/chemical stress agent called Lexiscan . This medicine will mimic walking on a treadmill by temporarily increasing your coronary blood flow.   Please note: these test may take anywhere between 2-4 hours to complete  PLEASE REPORT TO [facility] AT THE FIRST DESK WILL DIRECT YOU WHERE TO GO  Date of Procedure:___Thursday, Jan 12, 8:00am______________  Arrival Time for Procedure:________7:45am__________  Instructions regarding medication:     __xx__:  Hold metoprolol night before procedure and morning of procedure  _xx___:  Hold other medications as follows: Hold HCTZ the morning of your test  PLEASE NOTIFY THE OFFICE AT LEAST 24 HOURS IN ADVANCE IF YOU ARE UNABLE TO KEEP YOUR APPOINTMENT.  [phone] AND  PLEASE NOTIFY NUCLEAR MEDICINE AT [facility] AT LEAST 24 HOURS IN ADVANCE IF YOU ARE UNABLE TO KEEP YOUR APPOINTMENT. [phone]  How to  prepare for your Myoview test:   Do not eat or drink after midnight  No caffeine for 24 hours prior to test  No smoking 24 hours prior to test.  Your medication may be taken with water.  If your doctor stopped a medication because of this test, do not take that medication.  Ladies, please do not wear dresses.  Skirts or pants are appropriate. Please wear a short sleeve shirt.  No perfume, cologne or lotion.  Wear comfortable walking shoes. No heels!            Follow-Up: Your physician recommends that you schedule a follow-up appointment as needed with [name].    Any Other Special Instructions Will Be Listed Below (If Applicable).     If you need a refill on your cardiac medications before your next appointment, please call your pharmacy.  Cardiac Nuclear Scanning A cardiac nuclear scan is used to check your heart for problems, such as the following:  A portion of the heart is not getting enough blood.  Part of the heart muscle has died, which happens with a heart attack.  The heart wall is not working normally.  In this test, a radioactive dye (tracer) is injected into your bloodstream. After the tracer has traveled to your heart, a scanning device is used to measure how much of the tracer is absorbed by or distributed to various areas of your heart. LET [facility] CARE PROVIDER KNOW ABOUT:  Any allergies you have.  All medicines you are taking, including vitamins, herbs, eye drops, creams, and over-the-counter medicines.  Previous problems you or members of your family have had  with the use of anesthetics.  Any blood disorders you have.  Previous surgeries you have had.  Medical conditions you have.  RISKS AND COMPLICATIONS Generally, this is a safe procedure. However, as with any procedure, problems can occur. Possible problems include:   Serious chest pain.  Rapid heartbeat.  Sensation of warmth in your chest. This usually passes  quickly. BEFORE THE PROCEDURE Ask your health care provider about changing or stopping your regular medicines. PROCEDURE This procedure is usually done at a hospital and takes 2-4 hours.  An IV tube is inserted into one of your veins.  Your health care provider will inject a small amount of radioactive tracer through the tube.  You will then wait for 20-40 minutes while the tracer travels through your bloodstream.  You will lie down on an exam table so images of your heart can be taken. Images will be taken for about 15-20 minutes.  You will exercise on a treadmill or stationary bike. While you exercise, your heart activity will be monitored with an electrocardiogram (ECG), and your blood pressure will be checked.  If you are unable to exercise, you may be given a medicine to make your heart beat faster.  When blood flow to your heart has peaked, tracer will again be injected through the IV tube.  After 20-40 minutes, you will get back on the exam table and have more images taken of your heart.  When the procedure is over, your IV tube will be removed. AFTER THE PROCEDURE  You will likely be able to leave shortly after the test. Unless your health care provider tells you otherwise, you may return to your normal schedule, including diet, activities, and medicines.  Make sure you find out how and when you will get your test results.   This information is not intended to replace advice given to you by your health care provider. Make sure you discuss any questions you have with your health care provider.   Document Released: 05/08/2004 Document Revised: 04/18/2013 Document Reviewed: 03/22/2013 Elsevier Interactive Patient Education [employer].

## 2015-05-08 ENCOUNTER — Other Ambulatory Visit (HOSPITAL_COMMUNITY): Payer: PPO

## 2015-05-09 ENCOUNTER — Encounter
Admission: RE | Admit: 2015-05-09 | Discharge: 2015-05-09 | Disposition: A | Discharge status: Home / Self Care | Payer: PPO | Source: Ambulatory Visit | Attending: Cardiovascular Disease | Admitting: Cardiovascular Disease

## 2015-05-09 DIAGNOSIS — I4891 Unspecified atrial fibrillation: Secondary | ICD-10-CM

## 2015-05-09 DIAGNOSIS — I9789 Other postprocedural complications and disorders of the circulatory system, not elsewhere classified: Secondary | ICD-10-CM | POA: Insufficient documentation

## 2015-05-09 MED ORDER — REGADENOSON 0.4 MG/5ML IV SOLN
0.4000 mg | Freq: Once | INTRAVENOUS | Status: AC
  Administered 2015-05-09: 0.4 mg via INTRAVENOUS

## 2015-05-09 MED ORDER — TECHNETIUM TC 99M SESTAMIBI - CARDIOLITE
30.0000 | Freq: Once | INTRAVENOUS | Status: AC | PRN
  Administered 2015-05-09: 32.01 via INTRAVENOUS

## 2015-05-09 MED ORDER — TECHNETIUM TC 99M SESTAMIBI - CARDIOLITE
13.9770 | Freq: Once | INTRAVENOUS | Status: AC | PRN
  Administered 2015-05-09: 13.977 via INTRAVENOUS

## 2015-05-10 ENCOUNTER — Telehealth: Payer: Self-pay

## 2015-05-10 LAB — NM MYOCAR MULTI W/SPECT W/WALL MOTION / EF
CSEPHR: 63 %
CSEPPHR: 88 {beats}/min
LV dias vol: 44 mL
LVSYSVOL: 20 mL
Rest HR: 58 {beats}/min
SDS: 3
SRS: 1
SSS: 3
TID: 0.74

## 2015-05-10 NOTE — Telephone Encounter (Signed)
Pt would like stress test results. Please call. 

## 2015-05-10 NOTE — Telephone Encounter (Signed)
See lexi result note

## 2015-05-16 ENCOUNTER — Other Ambulatory Visit: Payer: Self-pay | Admitting: Radiology

## 2015-05-16 NOTE — Patient Instructions (Addendum)
YOUR PROCEDURE IS SCHEDULED ON :  05/24/15  REPORT TO [place] MAIN ENTRANCE FOLLOW SIGNS TO EAST ELEVATOR - GO TO 3rd FLOOR CHECK IN AT 3 EAST NURSES STATION (SHORT STAY) AT:  6:30 AM  CALL THIS NUMBER IF YOU HAVE PROBLEMS THE MORNING OF SURGERY [phone]  REMEMBER:ONLY 1 PER PERSON MAY GO TO SHORT STAY WITH YOU TO GET READY THE MORNING OF YOUR SURGERY  DO NOT EAT FOOD OR DRINK LIQUIDS AFTER MIDNIGHT  TAKE THESE MEDICINES THE MORNING OF SURGERY: AMLODIPINE / METOPROLOL / OMEPRAZOLE / LETROZOLE  YOU MAY NOT HAVE ANY METAL ON YOUR BODY INCLUDING HAIR PINS AND PIERCING'S. DO NOT WEAR JEWELRY, MAKEUP, LOTIONS, POWDERS OR PERFUMES. DO NOT WEAR NAIL POLISH. DO NOT SHAVE 48 HRS PRIOR TO SURGERY. MEN MAY SHAVE FACE AND NECK.  DO NOT [place]. [facility] IS NOT RESPONSIBLE FOR VALUABLES.  CONTACTS, DENTURES OR PARTIALS MAY NOT BE WORN TO SURGERY. LEAVE SUITCASE IN CAR. CAN BE BROUGHT TO ROOM AFTER SURGERY.  PATIENTS DISCHARGED THE DAY OF SURGERY WILL NOT BE ALLOWED TO DRIVE HOME.  PLEASE READ OVER THE FOLLOWING INSTRUCTION SHEETS _________________________________________________________________________________                                          [place] - PREPARING FOR SURGERY  Before surgery, you can play an important role.  Because skin is not sterile, your skin needs to be as free of germs as possible.  You can reduce the number of germs on your skin by washing with CHG (chlorahexidine gluconate) soap before surgery.  CHG is an antiseptic cleaner which kills germs and bonds with the skin to continue killing germs even after washing. Please DO NOT use if you have an allergy to CHG or antibacterial soaps.  If your skin becomes reddened/irritated stop using the CHG and inform your nurse when you arrive at Short Stay. Do not shave (including legs and underarms) for at least 48 hours prior to the first CHG shower.  You may shave your  face. Please follow these instructions carefully:   1.  Shower with CHG Soap the night before surgery and the  morning of Surgery.   2.  If you choose to wash your hair, wash your hair first as usual with your  normal  Shampoo.   3.  After you shampoo, rinse your hair and body thoroughly to remove the  shampoo.                                         4.  Use CHG as you would any other liquid soap.  You can apply chg directly  to the skin and wash . Gently wash with scrungie or clean wascloth    5.  Apply the CHG Soap to your body ONLY FROM THE NECK DOWN.   Do not use on open                           Wound or open sores. Avoid contact with eyes, ears mouth and genitals (private parts).                        Genitals (private parts)  with your normal soap.              6.  Wash thoroughly, paying special attention to the area where your surgery  will be performed.   7.  Thoroughly rinse your body with warm water from the neck down.   8.  DO NOT shower/wash with your normal soap after using and rinsing off  the CHG Soap .                9.  Pat yourself dry with a clean towel.             10.  Wear clean night clothes to bed after shower             11.  Place clean sheets on your bed the night of your first shower and do not  sleep with pets.  Day of Surgery : Do not apply any lotions/deodorants the morning of surgery.  Please wear clean clothes to the hospital/surgery center.  FAILURE TO FOLLOW THESE INSTRUCTIONS MAY RESULT IN THE CANCELLATION OF YOUR SURGERY    PATIENT SIGNATURE_________________________________  ______________________________________________________________________

## 2015-05-17 ENCOUNTER — Ambulatory Visit (HOSPITAL_COMMUNITY)
Admission: RE | Admit: 2015-05-17 | Discharge: 2015-05-17 | Disposition: A | Discharge status: Home / Self Care | Payer: PPO | Source: Ambulatory Visit | Attending: Radiology | Admitting: Radiology

## 2015-05-17 ENCOUNTER — Encounter (HOSPITAL_COMMUNITY): Payer: Self-pay

## 2015-05-17 ENCOUNTER — Encounter (HOSPITAL_COMMUNITY)
Admission: RE | Admit: 2015-05-17 | Discharge: 2015-05-17 | Disposition: A | Discharge status: Home / Self Care | Payer: PPO | Source: Ambulatory Visit | Attending: Interventional Radiology | Admitting: Interventional Radiology

## 2015-05-17 DIAGNOSIS — J9811 Atelectasis: Secondary | ICD-10-CM | POA: Diagnosis not present

## 2015-05-17 DIAGNOSIS — Z01818 Encounter for other preprocedural examination: Secondary | ICD-10-CM

## 2015-05-17 DIAGNOSIS — R918 Other nonspecific abnormal finding of lung field: Secondary | ICD-10-CM | POA: Diagnosis not present

## 2015-05-17 DIAGNOSIS — I10 Essential (primary) hypertension: Secondary | ICD-10-CM | POA: Diagnosis not present

## 2015-05-17 HISTORY — DX: Cardiac arrhythmia, unspecified: I49.9

## 2015-05-17 LAB — BASIC METABOLIC PANEL
ANION GAP: 6 (ref 5–15)
BUN: 29 mg/dL — ABNORMAL HIGH (ref 6–20)
CALCIUM: 9.5 mg/dL (ref 8.9–10.3)
CO2: 29 mmol/L (ref 22–32)
Chloride: 106 mmol/L (ref 101–111)
Creatinine, Ser: 1.42 mg/dL — ABNORMAL HIGH (ref 0.44–1.00)
GFR calc Af Amer: 39 mL/min — ABNORMAL LOW (ref >60)
GFR, EST NON AFRICAN AMERICAN: 34 mL/min — AB (ref >60)
Glucose, Bld: 92 mg/dL (ref 65–99)
Potassium: 4.4 mmol/L (ref 3.5–5.1)
Sodium: 141 mmol/L (ref 135–145)

## 2015-05-17 LAB — CBC WITH DIFFERENTIAL/PLATELET
BASOS ABS: 0 10*3/uL (ref 0.0–0.1)
BASOS PCT: 0 %
Eosinophils Absolute: 0.2 10*3/uL (ref 0.0–0.7)
Eosinophils Relative: 3 %
HEMATOCRIT: 44.1 % (ref 36.0–46.0)
HEMOGLOBIN: 13.9 g/dL (ref 12.0–15.0)
Lymphocytes Relative: 15 %
Lymphs Abs: 1.1 10*3/uL (ref 0.7–4.0)
MCH: 29 pg (ref 26.0–34.0)
MCHC: 31.5 g/dL (ref 30.0–36.0)
MCV: 91.9 fL (ref 78.0–100.0)
MONOS PCT: 8 %
Monocytes Absolute: 0.5 10*3/uL (ref 0.1–1.0)
NEUTROS ABS: 5.1 10*3/uL (ref 1.7–7.7)
NEUTROS PCT: 74 %
Platelets: 261 10*3/uL (ref 150–400)
RBC: 4.8 MIL/uL (ref 3.87–5.11)
RDW: 13.8 % (ref 11.5–15.5)
WBC: 6.9 10*3/uL (ref 4.0–10.5)

## 2015-05-17 LAB — PROTIME-INR
INR: 0.95 (ref 0.00–1.49)
Prothrombin Time: 12.9 seconds (ref 11.6–15.2)

## 2015-05-23 ENCOUNTER — Other Ambulatory Visit: Payer: Self-pay | Admitting: Radiology

## 2015-05-24 ENCOUNTER — Encounter (HOSPITAL_COMMUNITY)
Admission: RE | Disposition: A | Discharge status: Home / Self Care | Payer: Self-pay | Source: Ambulatory Visit | Attending: Interventional Radiology

## 2015-05-24 ENCOUNTER — Other Ambulatory Visit: Payer: Self-pay | Admitting: General Surgery

## 2015-05-24 ENCOUNTER — Ambulatory Visit (HOSPITAL_COMMUNITY)
Admission: RE | Admit: 2015-05-24 | Discharge: 2015-05-24 | Disposition: A | Discharge status: Home / Self Care | Payer: PPO | Source: Ambulatory Visit | Attending: Interventional Radiology | Admitting: Interventional Radiology

## 2015-05-24 ENCOUNTER — Encounter (HOSPITAL_COMMUNITY): Payer: Self-pay

## 2015-05-24 ENCOUNTER — Encounter (HOSPITAL_COMMUNITY): Payer: Self-pay | Admitting: Anesthesiology

## 2015-05-24 ENCOUNTER — Ambulatory Visit (HOSPITAL_COMMUNITY): Payer: PPO | Admitting: Anesthesiology

## 2015-05-24 DIAGNOSIS — M1990 Unspecified osteoarthritis, unspecified site: Secondary | ICD-10-CM | POA: Insufficient documentation

## 2015-05-24 DIAGNOSIS — Z905 Acquired absence of kidney: Secondary | ICD-10-CM | POA: Diagnosis not present

## 2015-05-24 DIAGNOSIS — Z853 Personal history of malignant neoplasm of breast: Secondary | ICD-10-CM | POA: Diagnosis not present

## 2015-05-24 DIAGNOSIS — N2889 Other specified disorders of kidney and ureter: Secondary | ICD-10-CM | POA: Diagnosis not present

## 2015-05-24 DIAGNOSIS — Z87891 Personal history of nicotine dependence: Secondary | ICD-10-CM | POA: Insufficient documentation

## 2015-05-24 DIAGNOSIS — I10 Essential (primary) hypertension: Secondary | ICD-10-CM | POA: Diagnosis not present

## 2015-05-24 DIAGNOSIS — C642 Malignant neoplasm of left kidney, except renal pelvis: Secondary | ICD-10-CM | POA: Diagnosis present

## 2015-05-24 DIAGNOSIS — Z79899 Other long term (current) drug therapy: Secondary | ICD-10-CM | POA: Diagnosis not present

## 2015-05-24 DIAGNOSIS — Z85528 Personal history of other malignant neoplasm of kidney: Secondary | ICD-10-CM | POA: Insufficient documentation

## 2015-05-24 DIAGNOSIS — N289 Disorder of kidney and ureter, unspecified: Secondary | ICD-10-CM | POA: Insufficient documentation

## 2015-05-24 DIAGNOSIS — I4891 Unspecified atrial fibrillation: Secondary | ICD-10-CM | POA: Insufficient documentation

## 2015-05-24 LAB — TYPE AND SCREEN
ABO/RH(D): O POS
ANTIBODY SCREEN: NEGATIVE

## 2015-05-24 LAB — BASIC METABOLIC PANEL
ANION GAP: 10 (ref 5–15)
BUN: 32 mg/dL — ABNORMAL HIGH (ref 6–20)
CALCIUM: 9.4 mg/dL (ref 8.9–10.3)
CO2: 26 mmol/L (ref 22–32)
Chloride: 103 mmol/L (ref 101–111)
Creatinine, Ser: 1.33 mg/dL — ABNORMAL HIGH (ref 0.44–1.00)
GFR, EST AFRICAN AMERICAN: 42 mL/min — AB (ref >60)
GFR, EST NON AFRICAN AMERICAN: 36 mL/min — AB (ref >60)
GLUCOSE: 102 mg/dL — AB (ref 65–99)
Potassium: 3.5 mmol/L (ref 3.5–5.1)
SODIUM: 139 mmol/L (ref 135–145)

## 2015-05-24 SURGERY — RADIO FREQUENCY ABLATION
Anesthesia: General | Laterality: Left

## 2015-05-24 MED ORDER — PROPOFOL 10 MG/ML IV BOLUS
INTRAVENOUS | Status: DC | PRN
  Administered 2015-05-24: 120 mg via INTRAVENOUS

## 2015-05-24 MED ORDER — FENTANYL CITRATE (PF) 100 MCG/2ML IJ SOLN
25.0000 ug | INTRAMUSCULAR | Status: DC | PRN
Start: 2015-05-24 — End: 2015-05-24

## 2015-05-24 MED ORDER — CEFAZOLIN SODIUM-DEXTROSE 2-3 GM-% IV SOLR
2.0000 g | INTRAVENOUS | Status: AC
  Administered 2015-05-24: 2 g via INTRAVENOUS
  Filled 2015-05-24: qty 50

## 2015-05-24 MED ORDER — EPHEDRINE SULFATE 50 MG/ML IJ SOLN
INTRAMUSCULAR | Status: DC | PRN
  Administered 2015-05-24 (×3): 5 mg via INTRAVENOUS
  Administered 2015-05-24: 10 mg via INTRAVENOUS

## 2015-05-24 MED ORDER — SENNOSIDES-DOCUSATE SODIUM 8.6-50 MG PO TABS
1.0000 | ORAL_TABLET | Freq: Every day | ORAL | Status: DC | PRN
  Filled 2015-05-24: qty 1

## 2015-05-24 MED ORDER — DOCUSATE SODIUM 100 MG PO CAPS
100.0000 mg | ORAL_CAPSULE | Freq: Two times a day (BID) | ORAL | Status: DC
  Filled 2015-05-24 (×4): qty 1

## 2015-05-24 MED ORDER — FENTANYL CITRATE (PF) 100 MCG/2ML IJ SOLN
INTRAMUSCULAR | Status: DC | PRN
  Administered 2015-05-24 (×2): 25 ug via INTRAVENOUS
  Administered 2015-05-24: 50 ug via INTRAVENOUS

## 2015-05-24 MED ORDER — ONDANSETRON HCL 4 MG/2ML IJ SOLN: 4.0000 mg | Freq: Four times a day (QID) | INTRAMUSCULAR | Status: DC | PRN

## 2015-05-24 MED ORDER — HYDROCODONE-ACETAMINOPHEN 5-325 MG PO TABS: 1.0000 | ORAL_TABLET | ORAL | Status: DC | PRN

## 2015-05-24 MED ORDER — LIDOCAINE HCL (CARDIAC) 20 MG/ML IV SOLN
INTRAVENOUS | Status: DC | PRN
  Administered 2015-05-24: 50 mg via INTRAVENOUS

## 2015-05-24 MED ORDER — FENTANYL CITRATE (PF) 250 MCG/5ML IJ SOLN
INTRAMUSCULAR | Status: AC
  Filled 2015-05-24: qty 5

## 2015-05-24 MED ORDER — ROCURONIUM BROMIDE 100 MG/10ML IV SOLN
INTRAVENOUS | Status: DC | PRN
  Administered 2015-05-24: 10 mg via INTRAVENOUS
  Administered 2015-05-24 (×3): 5 mg via INTRAVENOUS
  Administered 2015-05-24: 40 mg via INTRAVENOUS
  Administered 2015-05-24: 5 mg via INTRAVENOUS
  Administered 2015-05-24: 10 mg via INTRAVENOUS

## 2015-05-24 MED ORDER — SUGAMMADEX SODIUM 500 MG/5ML IV SOLN
INTRAVENOUS | Status: DC | PRN
Start: 2015-05-24 — End: 2015-05-24
  Administered 2015-05-24: 300 mg via INTRAVENOUS

## 2015-05-24 MED ORDER — DEXAMETHASONE SODIUM PHOSPHATE 10 MG/ML IJ SOLN
INTRAMUSCULAR | Status: DC | PRN
  Administered 2015-05-24: 8 mg via INTRAVENOUS

## 2015-05-24 MED ORDER — GLYCOPYRROLATE 0.2 MG/ML IJ SOLN
INTRAMUSCULAR | Status: DC | PRN
  Administered 2015-05-24: 0.2 mg via INTRAVENOUS
  Administered 2015-05-24: 0.1 mg via INTRAVENOUS

## 2015-05-24 MED ORDER — LACTATED RINGERS IV SOLN
INTRAVENOUS | Status: DC
  Administered 2015-05-24 (×2): via INTRAVENOUS

## 2015-05-24 MED ORDER — LACTATED RINGERS IV SOLN: INTRAVENOUS | Status: DC

## 2015-05-24 MED ORDER — ONDANSETRON HCL 4 MG/2ML IJ SOLN
INTRAMUSCULAR | Status: DC | PRN
  Administered 2015-05-24: 4 mg via INTRAVENOUS

## 2015-05-24 NOTE — Procedures (Signed)
CT/US guided microwave (RF-MWA) ablation L renal lesion No complication No blood loss. See complete dictation in [facility]. Plan observation, possibly overnight if needed for pain control etc.

## 2015-05-24 NOTE — Anesthesia Procedure Notes (Signed)
Procedure Name: Intubation Date/Time: 05/24/2015 8:50 AM Performed by: Bliss Tsang, Virgel Gess Pre-anesthesia Checklist: Patient identified, Emergency Drugs available, Suction available, Patient being monitored and Timeout performed Patient Re-evaluated:Patient Re-evaluated prior to inductionOxygen Delivery Method: Circle system utilized Preoxygenation: Pre-oxygenation with 100% oxygen Intubation Type: IV induction Ventilation: Mask ventilation without difficulty Laryngoscope Size: Mac and 4 Grade View: Grade I Tube type: Oral Tube size: 7.5 mm Number of attempts: 1 Airway Equipment and Method: Stylet Placement Confirmation: ETT inserted through vocal cords under direct vision,  positive ETCO2,  CO2 detector and breath sounds checked- equal and bilateral Secured at: 21 cm Tube secured with: Tape Dental Injury: Teeth and Oropharynx as per pre-operative assessment

## 2015-05-24 NOTE — Discharge Summary (Signed)
   Patient ID: Tara Ramos MRN: [id number] DOB/AGE: 80-Jan-1933 80 y.o.  Admit date: 05/24/2015 Discharge date: 05/24/2015  Admission Diagnoses: left renal mass  Discharge Diagnoses:  Active Problems:   Left renal mass   Cancer of left kidney [facility]) s/p microwave ablation of left kidney mass  Discharged Condition: good  Hospital Course: The patient was admitted and taken to the IR suite for the above procedure.  She tolerated this well.  Her foley was removed about 5 hours post procedure.  Her urine was clear with no evidence of bleeding.  She denied any pain, just soreness.  She was tolerating a regular diet with no nausea and otherwise stable for dc home.  Consults: None  Significant Diagnostic Studies: see procedure report  Treatments: IV hydration, analgesia: Vicodin and procedures: microwave ablation of left kidney mass  Discharge Exam: Blood pressure 170/70, pulse 59, temperature 97.9 F (36.6 C), resp. rate 16, height 5\' 2"  (1.575 m), weight 167 lb (75.751 kg), SpO2 97 %. General appearance: alert, cooperative and no distress GI: normal findings: soft, non-tender, ablation site is clean and covered with a band-aid on her left back.  Minimally sore to touch, no erythema or bleeding. GU: foley with clear yellow urine  Disposition: 01-Home or Self Care     Medication List    TAKE these medications        amLODipine 5 MG tablet  Commonly known as:  NORVASC  Take 7.5 mg by mouth every morning.     Calcium + D3 600-200 MG-UNIT Tabs  Take 1 tablet by mouth daily.     docusate sodium 100 MG capsule  Commonly known as:  COLACE  Take 100 mg by mouth at bedtime.     GLUCOSAMINE HCL PO  Take 1,000 mg by mouth daily.     hydrochlorothiazide 25 MG tablet  Commonly known as:  HYDRODIURIL  Take 50 mg by mouth daily.     HYDROcodone-acetaminophen 5-325 MG tablet  Commonly known as:  NORCO/VICODIN  Take 1-2 tablets by mouth every 4 (four) hours as needed for moderate  pain.     letrozole 2.5 MG tablet  Commonly known as:  FEMARA  Take 1 tablet (2.5 mg total) by mouth every morning.     losartan 50 MG tablet  Commonly known as:  COZAAR  Take 50 mg by mouth every morning.     metoprolol 100 MG tablet  Commonly known as:  LOPRESSOR  Take 100 mg by mouth 2 (two) times daily.     multivitamin with minerals Tabs tablet  Take 1 tablet by mouth daily.     omeprazole 20 MG capsule  Commonly known as:  PRILOSEC  Take 20 mg by mouth every morning.     vitamin C 1000 MG tablet  Take 1,000 mg by mouth daily.           Follow-up Information    Follow up with HASSELL III, DAYNE DANIEL, MD In 2 weeks.   Specialty:  Interventional Radiology   Why:  our office should call you with appointment time, if you do not hear from them by mid next week, please call them.   Contact information:   [address] [place] [address] [phone]        Electronically Signed: Henreitta Cea 05/24/2015, 3:32 PM   I have spent Less Than 30 Minutes discharging Tara Ramos.

## 2015-05-24 NOTE — Anesthesia Postprocedure Evaluation (Signed)
Anesthesia Post Note  Patient: Tara Ramos  Procedure(s) Performed: Procedure(s) (LRB): LEFT MICROWAVE ABLATION  (Left)  Patient location during evaluation: PACU Anesthesia Type: General Level of consciousness: awake and alert Pain management: pain level controlled Vital Signs Assessment: post-procedure vital signs reviewed and stable Respiratory status: spontaneous breathing, nonlabored ventilation, respiratory function stable and patient connected to nasal cannula oxygen Cardiovascular status: blood pressure returned to baseline and stable Postop Assessment: no signs of nausea or vomiting Anesthetic complications: no    Last Vitals:  Filed Vitals:   05/24/15 1158 05/24/15 1200  BP: 180/84   Pulse: 58 59  Temp:  36.6 C  Resp: 18 15    Last Pain: There were no vitals filed for this visit.               Lamichael Youkhana L

## 2015-05-24 NOTE — Transfer of Care (Signed)
Immediate Anesthesia Transfer of Care Note  Patient: Tara Ramos  Procedure(s) Performed: Procedure(s): LEFT MICROWAVE ABLATION  (Left)  Patient Location: PACU  Anesthesia Type:General  Level of Consciousness:  sedated, patient cooperative and responds to stimulation  Airway & Oxygen Therapy:Patient Spontanous Breathing and Patient connected to face mask oxgen  Post-op Assessment:  Report given to PACU RN and Post -op Vital signs reviewed and stable  Post vital signs:  Reviewed and stable  Last Vitals:  Filed Vitals:   05/24/15 0715 05/24/15 1109  BP: 195/77 180/92  Pulse:  71    Complications: No apparent anesthesia complications

## 2015-05-24 NOTE — H&P (Signed)
Referring Physician(s): Brandon,Ashley  Chief Complaint:  Left renal mass  Subjective: Tara Ramos is an 82 y.o. female who had bilateral renal lesions identified on a CT abdomen 08/01/2014 obtained for abdominal cramping and pain. Follow-up MRI 08/17/2014 revealed a 4.5 cm right upper pole mass, smaller 19 mm right lower pole enhancing lesion, and a contralateral left 19 mm enhancing partially exophytic mid kidney lesion. She had core biopsy of the dominant right lesion 10/31/2014 demonstrating renal cell carcinoma. She underwent robotic right nephrectomy 02/01/2015, which was well tolerated. The smaller inferior pole lesion was benign. She was seen recently by Dr. Vernard Gambles in consultation regarding treatment options for the left lesion, in the setting of solitary kidney and deemed an appropriate candidate for thermal/microwave ablation of the left renal lesion. She presents today for the procedure. She currently denies fever, chills, headache, chest pain, dyspnea, abdominal/back pain, nausea, vomiting or abnormal bleeding. He does have occasional dry cough.   Allergies: Morphine and related; Tape; and Lisinopril  Medications: Prior to Admission medications   Medication Sig Start Date End Date Taking? Authorizing Provider  amLODipine (NORVASC) 5 MG tablet Take 7.5 mg by mouth every morning.   Yes Historical Provider, MD  Ascorbic Acid (VITAMIN C) 1000 MG tablet Take 1,000 mg by mouth daily.   Yes Historical Provider, MD  Calcium Carb-Cholecalciferol (CALCIUM + D3) 600-200 MG-UNIT TABS Take 1 tablet by mouth daily.    Yes Historical Provider, MD  docusate sodium (COLACE) 100 MG capsule Take 100 mg by mouth at bedtime.   Yes Historical Provider, MD  GLUCOSAMINE HCL PO Take 1,000 mg by mouth daily.   Yes Historical Provider, MD  hydrochlorothiazide (HYDRODIURIL) 25 MG tablet Take 50 mg by mouth daily. 07/28/12  Yes Historical Provider, MD  letrozole (FEMARA) 2.5 MG tablet Take 1 tablet (2.5  mg total) by mouth every morning. 02/02/15  Yes Christell Faith, MD  losartan (COZAAR) 50 MG tablet Take 50 mg by mouth every morning.   Yes Historical Provider, MD  metoprolol (LOPRESSOR) 100 MG tablet Take 100 mg by mouth 2 (two) times daily.   Yes Historical Provider, MD  Multiple Vitamin (MULTIVITAMIN WITH MINERALS) TABS tablet Take 1 tablet by mouth daily.   Yes Historical Provider, MD  omeprazole (PRILOSEC) 20 MG capsule Take 20 mg by mouth every morning.  07/28/12  Yes Historical Provider, MD     Vital Signs: BP 195/77 mmHg  Ht 5\' 2"  (1.575 m)  Wt 167 lb (75.751 kg)  BMI 30.54 kg/m2  Physical Exam patient awake, alert. Chest clear to auscultation bilaterally. Heart with slightly bradycardic regular rhythm. Abdomen soft, positive bowel sounds, nontender. Extremities with full range of motion and no significant edema.  Imaging: No results found.  Labs:  CBC:  Recent Labs  10/31/14 0727  01/24/15 1315 02/01/15 1608 02/02/15 0503 03/26/15 1047 05/17/15 0935  WBC 8.1  --  9.0  --   --  8.2 6.9  HGB 14.5  < > 15.3* 14.0 12.4  --  13.9  HCT 44.2  < > 46.7* 42.8 39.4 44.6 44.1  PLT 280  --  307  --   --  313 261  < > = values in this interval not displayed.  COAGS:  Recent Labs  10/31/14 0727 05/17/15 0935  INR 0.85 0.95    BMP:  Recent Labs  02/02/15 0503 03/26/15 1047 05/17/15 0935 05/24/15 0700  NA 138 141 141 139  K 3.6 4.5 4.4 3.5  CL 103 97 106 103  CO2 28 30* 29 26  GLUCOSE 139* 97 92 102*  BUN 14 36* 29* 32*  CALCIUM 8.4* 10.4* 9.5 9.4  CREATININE 1.33* 1.69* 1.42* 1.33*  GFRNONAA 36* 28* 34* 36*  GFRAA 42* 32* 39* 42*    LIVER FUNCTION TESTS:  Recent Labs  08/01/14 0053 03/26/15 1047  BILITOT 0.7 0.6  AST 22 14  ALT 19 13  ALKPHOS 80 69  PROT 7.4 6.3  ALBUMIN 4.1 4.1    Assessment and Plan: Patient with prior history of breast cancer as well as right renal cell carcinoma, status post robotic right nephrectomy in October 2016 ; also  with mild renal insufficiency and mid left renal lesion measuring approximately 1.9 cm and suspicious for renal cell carcinoma. Patient seen recently in consultation by Dr. Vernard Gambles and deemed an appropriate candidate for thermal/microwave ablation. She presents today for the procedure. Details/risks of procedure, including not limited to, internal bleeding, infection, anesthesia-related complications, injury to adjacent organs and death discussed with patient with her understanding and consent. Following the procedure tentative plan is for overnight observation.    Electronically Signed: D. Rowe Robert 05/24/2015, 8:10 AM   I spent a total of 30 minutes at the the patient's bedside AND on the patient's hospital floor or unit, greater than 50% of which was counseling/coordinating care for CT-guided thermal/microwave ablation of left renal lesion

## 2015-05-24 NOTE — Discharge Instructions (Signed)
Please call our office if you develop fevers, chills, blood in your urine, worsening pain. No heavy lifting for 2 days.

## 2015-05-24 NOTE — Anesthesia Preprocedure Evaluation (Signed)
Anesthesia Evaluation  Patient identified by MRN, date of birth, ID band Patient awake    Reviewed: Allergy & Precautions, H&P , NPO status , Patient's Chart, lab work & pertinent test results, reviewed documented beta blocker date and time   Airway Mallampati: I  TM Distance: >3 FB Neck ROM: Full    Dental no notable dental hx. (+) Edentulous Upper, Edentulous Lower, Dental Advisory Given   Pulmonary neg pulmonary ROS, former smoker,    Pulmonary exam normal breath sounds clear to auscultation       Cardiovascular hypertension, Pt. on medications and Pt. on home beta blockers Normal cardiovascular exam+ dysrhythmias Atrial Fibrillation  Rhythm:Regular Rate:Normal     Neuro/Psych negative neurological ROS  negative psych ROS   GI/Hepatic negative GI ROS, Neg liver ROS, GERD  Medicated and Controlled,  Endo/Other  negative endocrine ROS  Renal/GU Renal diseaseRenal cell Ca s/p nephrectomy  negative genitourinary   Musculoskeletal  (+) Arthritis ,   Abdominal   Peds negative pediatric ROS (+)  Hematology negative hematology ROS (+)   Anesthesia Other Findings   Reproductive/Obstetrics negative OB ROS                             Anesthesia Physical Anesthesia Plan  ASA: III  Anesthesia Plan: General   Post-op Pain Management:    Induction: Intravenous  Airway Management Planned: Oral ETT  Additional Equipment:   Intra-op Plan:   Post-operative Plan: Extubation in OR  Informed Consent: I have reviewed the patients History and Physical, chart, labs and discussed the procedure including the risks, benefits and alternatives for the proposed anesthesia with the patient or authorized representative who has indicated his/her understanding and acceptance.   Dental Advisory Given  Plan Discussed with: CRNA and Surgeon  Anesthesia Plan Comments:         Anesthesia Quick  Evaluation

## 2015-06-04 ENCOUNTER — Other Ambulatory Visit: Payer: Self-pay | Admitting: *Deleted

## 2015-06-04 DIAGNOSIS — C642 Malignant neoplasm of left kidney, except renal pelvis: Secondary | ICD-10-CM

## 2015-06-13 DIAGNOSIS — C642 Malignant neoplasm of left kidney, except renal pelvis: Secondary | ICD-10-CM | POA: Diagnosis not present

## 2015-06-17 ENCOUNTER — Other Ambulatory Visit: Payer: Self-pay | Admitting: Diagnostic Radiology

## 2015-06-19 ENCOUNTER — Ambulatory Visit
Admission: RE | Admit: 2015-06-19 | Discharge: 2015-06-19 | Disposition: A | Discharge status: Home / Self Care | Payer: PPO | Source: Ambulatory Visit | Attending: General Surgery | Admitting: General Surgery

## 2015-06-19 DIAGNOSIS — N2889 Other specified disorders of kidney and ureter: Secondary | ICD-10-CM | POA: Diagnosis not present

## 2015-06-19 NOTE — Progress Notes (Signed)
Patient ID: Tara Ramos, female   DOB: 1931/05/27, 82 y.o.   MRN: [id number]   Referring Physician(s): [place]  Chief Complaint: The patient is seen in follow up today s/p CT guided microwave ablation of the left renal mass on 05/24/15  History of present illness: Tara Ramos is an 82 y.o. female who had bilateral renal lesions identified on a CT abdomen 08/01/2014 obtained for abdominal cramping and pain. Follow-up MRI 08/17/2014 revealed a 4.5 cm right upper pole mass, smaller 19 mm right lower pole enhancing lesion, and a contralateral left 19 mm enhancing partially exophytic mid kidney lesion. She had core biopsy of the dominant right lesion 10/31/2014 demonstrating renal cell carcinoma. She underwent robotic right nephrectomy 02/01/2015, which was well tolerated. The smaller inferior pole lesion was benign. She was seen recently by Dr. Vernard Gambles in consultation regarding treatment options for the left lesion, in the setting of solitary kidney and deemed an appropriate candidate for thermal/microwave ablation of the left renal lesion. On 05/24/15 she underwent technically successful CT-guided microwave ablation of the left renal lesion via general anesthesia. She tolerated the procedure well and was discharged home later that afternoon. She returns today accompanied by her husband for routine outpatient follow-up. The patient has done well since her discharge from the hospital. She currently denies any acute complaints, specifically chest pain, dyspnea, abdominal/back pain, hematuria or dysuria. Patient is ambulating well and tolerating her diet without difficulty. She has no bowel or bladder functional difficulties. Most recent creatinine on 06/13/15 was 1.32.  Past Medical History  Diagnosis Date  . Hemorrhoids   . Arthritis   . Status post radiation therapy     breast cancer bilateral  . GERD (gastroesophageal reflux disease)   . Hypertension   . Chronic kidney disease   . Heart murmur      hx of years ago   . Dysrhythmia     HX A FIB - FOLLOWED BY [name]  . 174.4 12/16/2012    left, then right, then right breast cancers (lumpectomies and radiation therapy)    Past Surgical History  Procedure Laterality Date  . Other surgical history      radiation therapy breast  . Breast lumpectomy Left 1990  . Breast lumpectomy Right 1992  . Breast biopsy Right 2014    ATYPICAL DUCTAL PROLIFERATION WITH MARKED DISTORTION BY  . Eye surgery Bilateral     cataract extraction  . Dilation and curettage of uterus    . Kyphoplasty N/A 10/09/2014    Procedure: KYPHOPLASTY;  Surgeon: Hessie Knows, MD;  Location: [facility] ORS;  Service: Orthopedics;  Laterality: N/A;  T7 Kyphoplasty with bone biopsy  . Robot assisted laparoscopic nephrectomy Right 02/01/2015    Procedure: ROBOTIC ASSISTED LAPAROSCOPIC RIGHT NEPHRECTOMY;  Surgeon: Cleon Gustin, MD;  Location: [facility] ORS;  Service: Urology;  Laterality: Right;    Allergies: Morphine and related; Lisinopril; and Tape  Medications: Prior to Admission medications   Medication Sig Start Date End Date Taking? Authorizing Provider  amLODipine (NORVASC) 5 MG tablet Take 7.5 mg by mouth every morning.    Historical Provider, MD  Ascorbic Acid (VITAMIN C) 1000 MG tablet Take 1,000 mg by mouth daily.    Historical Provider, MD  Calcium Carb-Cholecalciferol (CALCIUM + D3) 600-200 MG-UNIT TABS Take 1 tablet by mouth daily.     Historical Provider, MD  docusate sodium (COLACE) 100 MG capsule Take 100 mg by mouth at bedtime.    Historical Provider, MD  GLUCOSAMINE HCL PO  Take 1,000 mg by mouth daily.    Historical Provider, MD  hydrochlorothiazide (HYDRODIURIL) 25 MG tablet Take 50 mg by mouth daily. 07/28/12   Historical Provider, MD  HYDROcodone-acetaminophen (NORCO/VICODIN) 5-325 MG tablet Take 1-2 tablets by mouth every 4 (four) hours as needed for moderate pain. 05/24/15   Saverio Danker, PA-C  letrozole [facility]) 2.5 MG tablet Take 1 tablet (2.5 mg  total) by mouth every morning. 02/02/15   Christell Faith, MD  losartan (COZAAR) 50 MG tablet Take 50 mg by mouth every morning.    Historical Provider, MD  metoprolol (LOPRESSOR) 100 MG tablet Take 100 mg by mouth 2 (two) times daily.    Historical Provider, MD  Multiple Vitamin (MULTIVITAMIN WITH MINERALS) TABS tablet Take 1 tablet by mouth daily.    Historical Provider, MD  omeprazole (PRILOSEC) 20 MG capsule Take 20 mg by mouth every morning.  07/28/12   Historical Provider, MD     Family History  Problem Relation Age of Onset  . Stroke Father   . Stomach cancer Mother   . Brain cancer Sister     Social History   Social History  . Marital Status: Married    Spouse Name: N/A  . Number of Children: N/A  . Years of Education: N/A   Social History Main Topics  . Smoking status: Former Smoker -- 20 years    Types: Cigarettes    Quit date: 04/27/1972  . Smokeless tobacco: Never Used  . Alcohol Use: 0.6 oz/week    1 Glasses of wine per week     Comment: 1 glass of wine with dinner occ  . Drug Use: No  . Sexual Activity: No   Other Topics Concern  . None   Social History Narrative     Vital Signs: BP 203/91 mmHg  Pulse 54  Temp(Src) 98.3 F (36.8 C)  Resp 14  SpO2 98%  Physical Exam patient awake, alert. Chest clear to auscultation bilaterally. Heart with slightly bradycardic but regular rhythm; abdomen soft, positive bowel sounds, nontender; puncture site left flank clean, dry, nontender, no hematoma.  Imaging: No results found.  Labs:  CBC:  Recent Labs  10/31/14 0727  01/24/15 1315 02/01/15 1608 02/02/15 0503 03/26/15 1047 05/17/15 0935  WBC 8.1  --  9.0  --   --  8.2 6.9  HGB 14.5  < > 15.3* 14.0 12.4  --  13.9  HCT 44.2  < > 46.7* 42.8 39.4 44.6 44.1  PLT 280  --  307  --   --  313 261  < > = values in this interval not displayed.  COAGS:  Recent Labs  10/31/14 0727 05/17/15 0935  INR 0.85 0.95    BMP:  Recent Labs  02/02/15 0503  03/26/15 1047 05/17/15 0935 05/24/15 0700  NA 138 141 141 139  K 3.6 4.5 4.4 3.5  CL 103 97 106 103  CO2 28 30* 29 26  GLUCOSE 139* 97 92 102*  BUN 14 36* 29* 32*  CALCIUM 8.4* 10.4* 9.5 9.4  CREATININE 1.33* 1.69* 1.42* 1.33*  GFRNONAA 36* 28* 34* 36*  GFRAA 42* 32* 39* 42*    LIVER FUNCTION TESTS:  Recent Labs  08/01/14 0053 03/26/15 1047  BILITOT 0.7 0.6  AST 22 14  ALT 19 13  ALKPHOS 80 69  PROT 7.4 6.3  ALBUMIN 4.1 4.1    Assessment: Patient with prior history of right renal cell carcinoma, status post nephrectomy in 2016; status post CT  guided microwave ablation of a 1.9 cm exophytic mid left kidney lesion on 05/24/15; clinically stable with current creatinine of 1.32. Plan is for follow-up CT of the abdomen with IV contrast in approximately 3 months. Patient was told to contact our service in the interim with any additional questions or concerns. She will also continue current follow-up with Dr. Erlene Quan.   Signed: D. Rowe Robert 06/19/2015, 10:20 AM   Please refer to Dr. Adron Bene attestation of this note for management and plan.

## 2015-06-24 ENCOUNTER — Inpatient Hospital Stay: Payer: PPO | Admitting: Oncology

## 2015-06-24 ENCOUNTER — Inpatient Hospital Stay: Payer: PPO

## 2015-06-28 ENCOUNTER — Inpatient Hospital Stay (HOSPITAL_BASED_OUTPATIENT_CLINIC_OR_DEPARTMENT_OTHER): Payer: PPO | Admitting: Oncology

## 2015-06-28 ENCOUNTER — Encounter: Payer: Self-pay | Admitting: Oncology

## 2015-06-28 ENCOUNTER — Other Ambulatory Visit: Payer: Self-pay | Admitting: *Deleted

## 2015-06-28 ENCOUNTER — Inpatient Hospital Stay: Payer: PPO | Attending: Oncology

## 2015-06-28 VITALS — BP 165/93 | HR 55 | Temp 97.8°F | Resp 18 | Wt 166.4 lb

## 2015-06-28 DIAGNOSIS — C649 Malignant neoplasm of unspecified kidney, except renal pelvis: Secondary | ICD-10-CM

## 2015-06-28 DIAGNOSIS — Z905 Acquired absence of kidney: Secondary | ICD-10-CM

## 2015-06-28 DIAGNOSIS — Z79899 Other long term (current) drug therapy: Secondary | ICD-10-CM | POA: Insufficient documentation

## 2015-06-28 DIAGNOSIS — E041 Nontoxic single thyroid nodule: Secondary | ICD-10-CM | POA: Insufficient documentation

## 2015-06-28 DIAGNOSIS — K219 Gastro-esophageal reflux disease without esophagitis: Secondary | ICD-10-CM | POA: Diagnosis not present

## 2015-06-28 DIAGNOSIS — Z170 Estrogen receptor positive status [ER+]: Secondary | ICD-10-CM | POA: Diagnosis not present

## 2015-06-28 DIAGNOSIS — Z79810 Long term (current) use of selective estrogen receptor modulators (SERMs): Secondary | ICD-10-CM

## 2015-06-28 DIAGNOSIS — M1990 Unspecified osteoarthritis, unspecified site: Secondary | ICD-10-CM | POA: Insufficient documentation

## 2015-06-28 DIAGNOSIS — N189 Chronic kidney disease, unspecified: Secondary | ICD-10-CM | POA: Insufficient documentation

## 2015-06-28 DIAGNOSIS — C50411 Malignant neoplasm of upper-outer quadrant of right female breast: Secondary | ICD-10-CM | POA: Insufficient documentation

## 2015-06-28 DIAGNOSIS — I4891 Unspecified atrial fibrillation: Secondary | ICD-10-CM | POA: Diagnosis not present

## 2015-06-28 DIAGNOSIS — C641 Malignant neoplasm of right kidney, except renal pelvis: Secondary | ICD-10-CM

## 2015-06-28 DIAGNOSIS — I129 Hypertensive chronic kidney disease with stage 1 through stage 4 chronic kidney disease, or unspecified chronic kidney disease: Secondary | ICD-10-CM | POA: Insufficient documentation

## 2015-06-28 DIAGNOSIS — N2889 Other specified disorders of kidney and ureter: Secondary | ICD-10-CM | POA: Diagnosis not present

## 2015-06-28 DIAGNOSIS — Z87891 Personal history of nicotine dependence: Secondary | ICD-10-CM | POA: Diagnosis not present

## 2015-06-28 DIAGNOSIS — Z923 Personal history of irradiation: Secondary | ICD-10-CM | POA: Diagnosis not present

## 2015-06-28 LAB — COMPREHENSIVE METABOLIC PANEL
ALT: 13 U/L — AB (ref 14–54)
AST: 14 U/L — AB (ref 15–41)
Albumin: 3.9 g/dL (ref 3.5–5.0)
Alkaline Phosphatase: 59 U/L (ref 38–126)
Anion gap: 6 (ref 5–15)
BILIRUBIN TOTAL: 0.7 mg/dL (ref 0.3–1.2)
BUN: 41 mg/dL — AB (ref 6–20)
CHLORIDE: 99 mmol/L — AB (ref 101–111)
CO2: 32 mmol/L (ref 22–32)
CREATININE: 1.57 mg/dL — AB (ref 0.44–1.00)
Calcium: 9.3 mg/dL (ref 8.9–10.3)
GFR, EST AFRICAN AMERICAN: 35 mL/min — AB (ref >60)
GFR, EST NON AFRICAN AMERICAN: 30 mL/min — AB (ref >60)
Glucose, Bld: 84 mg/dL (ref 65–99)
POTASSIUM: 3.6 mmol/L (ref 3.5–5.1)
Sodium: 137 mmol/L (ref 135–145)
TOTAL PROTEIN: 6.6 g/dL (ref 6.5–8.1)

## 2015-06-28 LAB — CBC WITH DIFFERENTIAL/PLATELET
BASOS ABS: 0.1 10*3/uL (ref 0–0.1)
Basophils Relative: 1 %
EOS PCT: 3 %
Eosinophils Absolute: 0.2 10*3/uL (ref 0–0.7)
HEMATOCRIT: 43.1 % (ref 35.0–47.0)
Hemoglobin: 14.6 g/dL (ref 12.0–16.0)
LYMPHS ABS: 1.1 10*3/uL (ref 1.0–3.6)
LYMPHS PCT: 14 %
MCH: 29.6 pg (ref 26.0–34.0)
MCHC: 33.9 g/dL (ref 32.0–36.0)
MCV: 87.2 fL (ref 80.0–100.0)
MONO ABS: 0.6 10*3/uL (ref 0.2–0.9)
MONOS PCT: 8 %
NEUTROS ABS: 5.7 10*3/uL (ref 1.4–6.5)
Neutrophils Relative %: 74 %
PLATELETS: 272 10*3/uL (ref 150–440)
RBC: 4.95 MIL/uL (ref 3.80–5.20)
RDW: 13.4 % (ref 11.5–14.5)
WBC: 7.7 10*3/uL (ref 3.6–11.0)

## 2015-06-28 NOTE — Progress Notes (Signed)
[place] @ [facility] [phone]) [phone]  Fax:([phone]) [place]: Nov 30, 1931  MR#: [id number]  EEF#:[id number]  Patient Care Team: Adin Hector, MD as PCP - General (Internal Medicine) Robert Bellow, MD (General Surgery) Clent Jacks, RN as Registered Nurse Hollice Espy, MD as Consulting Physician (Urology) Hessie Knows, MD as Consulting Physician (Orthopedic Surgery)  CHIEF COMPLAINT:  Chief Complaint  Patient presents with  . Renal cell carcinoma    Oncology History   Impression:   1.cT scan has been reviewed independently sows a complex 3 x 4 cm mass on the right kidney suspicious for renal cancer.  There are no enlarged lymph nodes.  There is small but indeterminate mass in the left kidney. 2.  Previous history of carcinoma breast.  In 1990 patient had lumpectomyin the left breast followed by radiation therapy.  In 1992 lumpectomy and right breast followed by radiation therapy.  In 2014 patient had a local recurrent disease and had lumpectomy on the right breast patient has been started on Femara. By Psychologist, sport and exercise.  Patient has taken tamoxifen after 1992 Patient has multiple kidney mass is in both kidney.  On CT and MRI scan.  I reviewed all the CT scan and MRI scan with the patient and case also was discussed in tumor conference I also discussed situation with Dr. Rayburn Ma as well as her interventional radiologist.  Possibility of either observation versus radiofrequency or cryoablation off small to masses seen in the right and left kidney and watch 1 complex cystic mass had been discussed.  Patient had been referred then to urologist Dr. Rayburn Ma patient   does have T7 compression fracture as well as thyroid nodule which will have to be taken into consideration if we decide to go for    nephrectomy on the right side.      3.  S/P  right nephrectomy (February 04, 2015) CLEAR cell carcinoma, pT1  pNX  CMO. pT1  pNX  CMO. 4.  Patient underwent CT-guided  radiofrequency ablation of left kidney lesion.  T1 N0 M0 tumor  Stage I disease.   INTERVAL HISTORY: 82 year old lady came today further follow-up patient had a right nephrectomy.  Tolerated procedure very well. Patient still has abnormality of the left kidney which is being evaluated by urologist.  Has a history of breast cancer on letrozole or tolerating very well.  Here for further follow-up and treatment consideration No bony pains.  Appetite has been stable.  Patient is here for further follow-up and treatment consideration.  Patient has undergone radiofrequency ablation of the left renal lesion.  Tolerated procedure very well.  Patient has a regular follow-up schedule with urologist.    REVIEW OF SYSTEMS:   Gen. Status: Patient is apprehensive.HEENT: No headache.  No hearing loss.  No ear pain.  No nosebleed or congestion.  No sore throat.  No difficulty swallowing Respiratory system: No cough.  No hemoptysis.  No shortness of breath at rest or exertion.  No chest pain. Musculoskeletal system low back pain Cardiovascular system: No chest pain.  No palpitation.  No paroxysmal nocturnal dyspnea. Gastro intestinal system: No heartburn.  No nausea or vomiting.  No abdominal pain.  No diarrhea.  No constipation.  No rectal bleeding. Skin: No evidence of ecchymosis or rash. -Neurological system: No dizziness.  No tingling.  His was.  no tingling numbness.  no focal weakness or any focal signs. As per HPI. Otherwise, a complete review of systems is negatve.  PAST MEDICAL HISTORY: Past Medical History  Diagnosis Date  . Hemorrhoids   . Arthritis   . Status post radiation therapy     breast cancer bilateral  . GERD (gastroesophageal reflux disease)   . Hypertension   . Chronic kidney disease   . Heart murmur     hx of years ago   . Dysrhythmia     HX A FIB - FOLLOWED BY [name]  . 174.4 12/16/2012    left, then right, then right breast cancers (lumpectomies and radiation  therapy)    PAST SURGICAL HISTORY: Past Surgical History  Procedure Laterality Date  . Other surgical history      radiation therapy breast  . Breast lumpectomy Left 1990  . Breast lumpectomy Right 1992  . Breast biopsy Right 2014    ATYPICAL DUCTAL PROLIFERATION WITH MARKED DISTORTION BY  . Eye surgery Bilateral     cataract extraction  . Dilation and curettage of uterus    . Kyphoplasty N/A 10/09/2014    Procedure: KYPHOPLASTY;  Surgeon: Hessie Knows, MD;  Location: [facility] ORS;  Service: Orthopedics;  Laterality: N/A;  T7 Kyphoplasty with bone biopsy  . Robot assisted laparoscopic nephrectomy Right 02/01/2015    Procedure: ROBOTIC ASSISTED LAPAROSCOPIC RIGHT NEPHRECTOMY;  Surgeon: Cleon Gustin, MD;  Location: [facility] ORS;  Service: Urology;  Laterality: Right;    FAMILY HISTORY Family History  Problem Relation Age of Onset  . Stroke Father   . Stomach cancer Mother   . Brain cancer Sister     ADVANCED DIRECTIVES:  No flowsheet data found.  HEALTH MAINTENANCE: Social History  Substance Use Topics  . Smoking status: Former Smoker -- 20 years    Types: Cigarettes    Quit date: 04/27/1972  . Smokeless tobacco: Never Used  . Alcohol Use: 0.6 oz/week    1 Glasses of wine per week     Comment: 1 glass of wine with dinner occ      Allergies  Allergen Reactions  . Morphine And Related Nausea And Vomiting  . Lisinopril Cough  . Tape Other (See Comments)    Blisters underneath; wide white hypofix tape post kidney biopsy     Current Outpatient Prescriptions  Medication Sig Dispense Refill  . amLODipine (NORVASC) 5 MG tablet Take 7.5 mg by mouth every morning.    . Ascorbic Acid (VITAMIN C) 1000 MG tablet Take 1,000 mg by mouth daily.    . Calcium Carb-Cholecalciferol (CALCIUM + D3) 600-200 MG-UNIT TABS Take 1 tablet by mouth daily.     Marland Kitchen docusate sodium (COLACE) 100 MG capsule Take 100 mg by mouth at bedtime.    Marland Kitchen GLUCOSAMINE HCL PO Take 1,000 mg by mouth daily.    .  hydrochlorothiazide (HYDRODIURIL) 25 MG tablet Take 50 mg by mouth daily.    Marland Kitchen HYDROcodone-acetaminophen (NORCO/VICODIN) 5-325 MG tablet Take 1-2 tablets by mouth every 4 (four) hours as needed for moderate pain. 30 tablet 0  . letrozole (FEMARA) 2.5 MG tablet Take 1 tablet (2.5 mg total) by mouth every morning. 30 tablet 12  . losartan (COZAAR) 50 MG tablet Take 50 mg by mouth every morning.    . metoprolol (LOPRESSOR) 100 MG tablet Take 100 mg by mouth 2 (two) times daily.    . Multiple Vitamin (MULTIVITAMIN WITH MINERALS) TABS tablet Take 1 tablet by mouth daily.    Marland Kitchen omeprazole (PRILOSEC) 20 MG capsule Take 20 mg by mouth every morning.      No current facility-administered  medications for this visit.    OBJECTIVE:  Filed Vitals:   06/28/15 1128  BP: 165/93  Pulse: 55  Temp: 97.8 F (36.6 C)  Resp: 18     Body mass index is 30.44 kg/(m^2).    ECOG FS:1 - Symptomatic but completely ambulatory  PHYSICAL EXAM: General  status: Performance status is good.  Patient has not lost significant weight HEENT: No evidence of stomatitis. Sclera and conjunctivae :: No jaundice.   pale looking. Lungs: Air  entry equal on both sides.  No rhonchi.  No rales.  Cardiac: Heart sounds are normal.  No pericardial rub.  No murmur. Lymphatic system: Cervical, axillary, inguinal, lymph nodes not palpable GI: Abdomen is soft.  No ascites.  Liver spleen not palpable.  No tenderness.  Bowel sounds are within normal limit Lower extremity: No edema Neurological system: Higher functions, cranial nerves intact no evidence of peripheral neuropathy. Skin: No rash.  No ecchymosis.. Examination of breasts: Right breast in upper and outer quadrant there is thickening most likely representing previous lumpectomy.  Left breast free of masses   no palpable axillary lymph nodes  LAB RESULTS:  Appointment on 06/28/2015  Component Date Value Ref Range Status  . WBC 06/28/2015 7.7  3.6 - 11.0 K/uL Final  . RBC  06/28/2015 4.95  3.80 - 5.20 MIL/uL Final  . Hemoglobin 06/28/2015 14.6  12.0 - 16.0 g/dL Final  . HCT 06/28/2015 43.1  35.0 - 47.0 % Final  . MCV 06/28/2015 87.2  80.0 - 100.0 fL Final  . MCH 06/28/2015 29.6  26.0 - 34.0 pg Final  . MCHC 06/28/2015 33.9  32.0 - 36.0 g/dL Final  . RDW 06/28/2015 13.4  11.5 - 14.5 % Final  . Platelets 06/28/2015 272  150 - 440 K/uL Final  . Neutrophils Relative % 06/28/2015 74   Final  . Neutro Abs 06/28/2015 5.7  1.4 - 6.5 K/uL Final  . Lymphocytes Relative 06/28/2015 14   Final  . Lymphs Abs 06/28/2015 1.1  1.0 - 3.6 K/uL Final  . Monocytes Relative 06/28/2015 8   Final  . Monocytes Absolute 06/28/2015 0.6  0.2 - 0.9 K/uL Final  . Eosinophils Relative 06/28/2015 3   Final  . Eosinophils Absolute 06/28/2015 0.2  0 - 0.7 K/uL Final  . Basophils Relative 06/28/2015 1   Final  . Basophils Absolute 06/28/2015 0.1  0 - 0.1 K/uL Final  . Sodium 06/28/2015 137  135 - 145 mmol/L Final  . Potassium 06/28/2015 3.6  3.5 - 5.1 mmol/L Final  . Chloride 06/28/2015 99* 101 - 111 mmol/L Final  . CO2 06/28/2015 32  22 - 32 mmol/L Final  . Glucose, Bld 06/28/2015 84  65 - 99 mg/dL Final  . BUN 06/28/2015 41* 6 - 20 mg/dL Final  . Creatinine, Ser 06/28/2015 1.57* 0.44 - 1.00 mg/dL Final  . Calcium 06/28/2015 9.3  8.9 - 10.3 mg/dL Final  . Total Protein 06/28/2015 6.6  6.5 - 8.1 g/dL Final  . Albumin 06/28/2015 3.9  3.5 - 5.0 g/dL Final  . AST 06/28/2015 14* 15 - 41 U/L Final  . ALT 06/28/2015 13* 14 - 54 U/L Final  . Alkaline Phosphatase 06/28/2015 59  38 - 126 U/L Final  . Total Bilirubin 06/28/2015 0.7  0.3 - 1.2 mg/dL Final  . GFR calc non Af Amer 06/28/2015 30* >60 mL/min Final  . GFR calc Af Amer 06/28/2015 35* >60 mL/min Final   Comment: (NOTE) The eGFR has been calculated using  the CKD EPI equation. This calculation has not been validated in all clinical situations. eGFR's persistently <60 mL/min signify possible Chronic Kidney Disease.   . Anion gap  06/28/2015 6  5 - 15 Final      STUDIES: Pathology of the right kidney.  Right radical nephrectomy.  Clear cell renal carcinoma or centimeter in size margins are negative.  tUMOR   Is confined to the kidney.  No lymphovascular invasion seen. pT1a  pNx  M0 SAGE 1 ASSESSMENT: 1.CT scan of abdomen and MRI scan of abdomen is suggestive of multifocal kidney masses.  More significant on the right side.  Right partial nephrectomy was planned. 2.previous history of carcinoma of breast  Recurrent disease in 2014 3.  Bone scan and MRI scan is abnormal particularly in T7 spine pathological fracture cannot be ruled out  MEDICAL DECISION MAKING:  Patient's multiple malignancy records have been reviewed.  Recently had radiofrequency ablation off left renal lesion.   At present time there is no evidence of recurrent or progressive disease.   There is persistent thickening and induration in the right upper and outer quadrant of the breast patient has an another mammogram schedule in May,2017 which will help [place] to decide this abnormality somewhat further.  If needed biopsy can be done.  .  Continue calcium and vitamin D   Bone density study is going to be scheduled by primary care physician in May of 2017   Patient would be followed for carcinoma of kidney by urologist . Regarding breast cancer patient will come and see [place] in 6 months or before if needed.    Due to my planned retirement during next appointment patient would be seen by my other associates regarding breast cancer  Forest Gleason, MD   06/28/2015 11:40 AM

## 2015-07-08 DIAGNOSIS — C649 Malignant neoplasm of unspecified kidney, except renal pelvis: Secondary | ICD-10-CM | POA: Diagnosis not present

## 2015-07-08 DIAGNOSIS — K219 Gastro-esophageal reflux disease without esophagitis: Secondary | ICD-10-CM | POA: Diagnosis not present

## 2015-07-08 DIAGNOSIS — D0511 Intraductal carcinoma in situ of right breast: Secondary | ICD-10-CM | POA: Diagnosis not present

## 2015-07-08 DIAGNOSIS — I10 Essential (primary) hypertension: Secondary | ICD-10-CM | POA: Diagnosis not present

## 2015-07-15 ENCOUNTER — Other Ambulatory Visit: Payer: Self-pay | Admitting: Internal Medicine

## 2015-07-15 DIAGNOSIS — M545 Low back pain: Secondary | ICD-10-CM | POA: Diagnosis not present

## 2015-07-15 DIAGNOSIS — E784 Other hyperlipidemia: Secondary | ICD-10-CM | POA: Diagnosis not present

## 2015-07-15 DIAGNOSIS — N183 Chronic kidney disease, stage 3 (moderate): Secondary | ICD-10-CM | POA: Diagnosis not present

## 2015-07-15 DIAGNOSIS — K219 Gastro-esophageal reflux disease without esophagitis: Secondary | ICD-10-CM | POA: Diagnosis not present

## 2015-07-15 DIAGNOSIS — M5136 Other intervertebral disc degeneration, lumbar region: Secondary | ICD-10-CM | POA: Diagnosis not present

## 2015-07-15 DIAGNOSIS — M8580 Other specified disorders of bone density and structure, unspecified site: Secondary | ICD-10-CM | POA: Diagnosis not present

## 2015-07-15 DIAGNOSIS — C50919 Malignant neoplasm of unspecified site of unspecified female breast: Secondary | ICD-10-CM | POA: Insufficient documentation

## 2015-07-15 DIAGNOSIS — C50911 Malignant neoplasm of unspecified site of right female breast: Secondary | ICD-10-CM | POA: Diagnosis not present

## 2015-07-15 DIAGNOSIS — Z1239 Encounter for other screening for malignant neoplasm of breast: Secondary | ICD-10-CM | POA: Diagnosis not present

## 2015-07-15 DIAGNOSIS — I10 Essential (primary) hypertension: Secondary | ICD-10-CM | POA: Diagnosis not present

## 2015-07-15 DIAGNOSIS — D0511 Intraductal carcinoma in situ of right breast: Secondary | ICD-10-CM

## 2015-07-15 DIAGNOSIS — M4854XS Collapsed vertebra, not elsewhere classified, thoracic region, sequela of fracture: Secondary | ICD-10-CM | POA: Diagnosis not present

## 2015-07-18 ENCOUNTER — Telehealth: Payer: Self-pay | Admitting: Urology

## 2015-07-18 ENCOUNTER — Other Ambulatory Visit (HOSPITAL_COMMUNITY): Payer: Self-pay | Admitting: Interventional Radiology

## 2015-07-18 DIAGNOSIS — Z85528 Personal history of other malignant neoplasm of kidney: Secondary | ICD-10-CM

## 2015-07-18 DIAGNOSIS — N2889 Other specified disorders of kidney and ureter: Secondary | ICD-10-CM

## 2015-07-18 NOTE — Telephone Encounter (Signed)
No problem. CT abdomen with and without contrast ordered. Patient to follow-up in the office after the study.  Hollice Espy, MD

## 2015-07-18 NOTE — Telephone Encounter (Signed)
I advised the patient that Dr. Erlene Quan recommends that she complete her CT scan and follow up with Interventional Radiology.    Patient is insistent that she come back to [facility] to have the CT and the follow up with Dr. Erlene Quan.  If it is okay with you, can you please place the order for the CT scan.

## 2015-07-19 NOTE — Telephone Encounter (Signed)
done

## 2015-07-25 ENCOUNTER — Ambulatory Visit
Admission: RE | Admit: 2015-07-25 | Discharge: 2015-07-25 | Disposition: A | Discharge status: Home / Self Care | Payer: PPO | Source: Ambulatory Visit | Attending: Urology | Admitting: Urology

## 2015-07-25 DIAGNOSIS — C642 Malignant neoplasm of left kidney, except renal pelvis: Secondary | ICD-10-CM | POA: Diagnosis not present

## 2015-07-25 DIAGNOSIS — I700 Atherosclerosis of aorta: Secondary | ICD-10-CM | POA: Diagnosis not present

## 2015-07-25 DIAGNOSIS — Z85528 Personal history of other malignant neoplasm of kidney: Secondary | ICD-10-CM | POA: Diagnosis not present

## 2015-07-25 DIAGNOSIS — J90 Pleural effusion, not elsewhere classified: Secondary | ICD-10-CM | POA: Insufficient documentation

## 2015-07-25 LAB — POCT I-STAT CREATININE: Creatinine, Ser: 1.4 mg/dL — ABNORMAL HIGH (ref 0.44–1.00)

## 2015-07-25 MED ORDER — IOPAMIDOL (ISOVUE-370) INJECTION 76%
75.0000 mL | Freq: Once | INTRAVENOUS | Status: AC | PRN
  Administered 2015-07-25: 75 mL via INTRAVENOUS

## 2015-08-02 ENCOUNTER — Ambulatory Visit (INDEPENDENT_AMBULATORY_CARE_PROVIDER_SITE_OTHER): Payer: PPO | Admitting: Urology

## 2015-08-02 ENCOUNTER — Encounter: Payer: Self-pay | Admitting: Urology

## 2015-08-02 VITALS — BP 166/78 | HR 54 | Ht 62.0 in | Wt 164.9 lb

## 2015-08-02 DIAGNOSIS — J90 Pleural effusion, not elsewhere classified: Secondary | ICD-10-CM

## 2015-08-02 DIAGNOSIS — Q600 Renal agenesis, unilateral: Secondary | ICD-10-CM | POA: Diagnosis not present

## 2015-08-02 DIAGNOSIS — N183 Chronic kidney disease, stage 3 unspecified: Secondary | ICD-10-CM

## 2015-08-02 DIAGNOSIS — Z85528 Personal history of other malignant neoplasm of kidney: Secondary | ICD-10-CM | POA: Diagnosis not present

## 2015-08-02 DIAGNOSIS — IMO0002 Reserved for concepts with insufficient information to code with codable children: Secondary | ICD-10-CM

## 2015-08-02 NOTE — Progress Notes (Signed)
2:07 PM  08/02/2015  Tara Ramos August 04, 1931 [id number]  Referring provider: Adin Hector, MD [place], [place] [address]  Chief Complaint  Patient presents with  . Follow-up    CT Scan, pos ablation x    HPI: 82 year old with bilateral renal masses s/p right robotic nephrectomy in [place] by Dr. Alyson Ingles in 02/01/15 and CT guided microwave ablation of the left renal mass 05/24/15.  RCC history Initial imaging by MRI to confirm tthe size and location of these lesions. She was noted to have a 4.5 x 2.8 x 2.2 cm RIGHT medial  enhancing renal mass along with a 1.6 cm enhancing inferior pole renal mass. On the LEFT, she was found to have a 1.9 cm LEFT enhancing renal mass as well.    She does have a history of breast cancer, first diagnosed in 1990 status post lumpectomy and radiation. She developed recurrent disease in the same breast in 2014 at which time she underwent lumpectomy and no other therapy needed.  CT of the chest 08/17/14 showed no evidence for lung metastases but an incidental T7 compression fracture concerning was noted of which metastatic involvement not excluded. Bone scan was also somewhat concerning for pathological fracture. She most recently underwent a kyphoplasty with biopsy with was NEGATIVE for malignancy. She was also noted to have an incidental 11 mm left thyroid nodule.  Given her history of various malignancies and other radiographic findings, she did undergo biopsy of the right upper pole renal lesion which was indeed consistent with RCC.  Creatinine on 07/2014 0.96 preop.  She underwent R robotic  Nephrectomy per Dr. Alyson Ingles on 02/01/2015.  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 The inferior pole lesion was not consistent with malignancy.  No issues postoperatively.   She then underwent successful CT-guided microwave ablation of her left renal  lesion on 05/24/2015.   Most recent creatinine on 3/30//2017 was 1.40.   She returns today after follow-up CT scan. She would prefer to follow. [place] and is not ashen and returning back to [place] (radiology) at this time.   She underwent CT abdomen with and without contrast on 07/25/2015 which showed successful thermal ablation of the left upper pole renal cell carcinoma without evidence of residual recurrent tumor.  She did develop a left pleural effusion (asymptomatic).   PMH: Past Medical History  Diagnosis Date  . Hemorrhoids   . Arthritis   . Status post radiation therapy     breast cancer bilateral  . GERD (gastroesophageal reflux disease)   . Hypertension   . Chronic kidney disease   . Heart murmur     hx of years ago   . Dysrhythmia     HX A FIB - FOLLOWED BY [name]  . Cancer of right renal pelvis ([place]) 12/16/2012    left, then right, then right breast cancers (lumpectomies and radiation therapy)    Surgical History: Past Surgical History  Procedure Laterality Date  . Other surgical history      radiation therapy breast  . Breast lumpectomy Left 1990  . Breast lumpectomy Right 1992  . Breast biopsy Right 2014    ATYPICAL DUCTAL PROLIFERATION WITH MARKED DISTORTION BY  . Eye surgery Bilateral     cataract extraction  . Dilation and curettage of uterus    . Kyphoplasty N/A 10/09/2014    Procedure: KYPHOPLASTY;  Surgeon: Hessie Knows,  MD;  Location: [facility] ORS;  Service: Orthopedics;  Laterality: N/A;  T7 Kyphoplasty with bone biopsy  . Robot assisted laparoscopic nephrectomy Right 02/01/2015    Procedure: ROBOTIC ASSISTED LAPAROSCOPIC RIGHT NEPHRECTOMY;  Surgeon: Cleon Gustin, MD;  Location: [facility];  Service: Urology;  Laterality: Right;    Home Medications:    Medication List       This list is accurate as of: 08/02/15  2:07 PM.  Always use your most recent med list.               amLODipine 5 MG tablet  Commonly known as:  NORVASC  Take  7.5 mg by mouth every morning.     Calcium + D3 600-200 MG-UNIT Tabs  Take 1 tablet by mouth daily.     docusate sodium 100 MG capsule  Commonly known as:  COLACE  Take 100 mg by mouth at bedtime.     GLUCOSAMINE HCL PO  Take 1,000 mg by mouth daily.     hydrochlorothiazide 25 MG tablet  Commonly known as:  HYDRODIURIL  Take 50 mg by mouth daily.     HYDROcodone-acetaminophen 5-325 MG tablet  Commonly known as:  NORCO/VICODIN  Take 1-2 tablets by mouth every 4 (four) hours as needed for moderate pain.     letrozole 2.5 MG tablet  Commonly known as:  FEMARA  Take 1 tablet (2.5 mg total) by mouth every morning.     losartan 50 MG tablet  Commonly known as:  COZAAR  Take 50 mg by mouth every morning.     metoprolol 100 MG tablet  Commonly known as:  LOPRESSOR  Take 100 mg by mouth 2 (two) times daily.     multivitamin with minerals Tabs tablet  Take 1 tablet by mouth daily.     omeprazole 20 MG capsule  Commonly known as:  PRILOSEC  Take 20 mg by mouth every morning.     vitamin C 1000 MG tablet  Take 1,000 mg by mouth daily.        Allergies:  Allergies  Allergen Reactions  . Morphine And Related Nausea And Vomiting  . Lisinopril Cough  . Tape Other (See Comments)    Blisters underneath; wide white hypofix tape post kidney biopsy     Family History: Family History  Problem Relation Age of Onset  . Stroke Father   . Stomach cancer Mother   . Brain cancer Sister     Social History:  reports that she quit smoking about 43 years ago. Her smoking use included Cigarettes. She quit after 20 years of use. She has never used smokeless tobacco. She reports that she drinks about 0.6 oz of alcohol per week. She reports that she does not use illicit drugs.   Physical Exam: Ht 5\' 2"  (1.575 m)  Wt 164 lb 14.4 oz (74.798 kg)  BMI 30.15 kg/m2  O2 sat 98% Constitutional:  Alert and oriented, No acute distress. HEENT: [place] AT, moist mucus membranes.  Trachea midline, no  masses. Cardiovascular: No clubbing, cyanosis, or edema. Respiratory: Normal respiratory effort, no increased work of breathing. Skin: No rashes, bruises or suspicious lesions. Neurologic: Grossly intact, no focal deficits, moving all 4 extremities. Psychiatric: Normal mood and affect.  Laboratory Data: Lab Results  Component Value Date   WBC 7.7 06/28/2015   HGB 14.6 06/28/2015   HCT 43.1 06/28/2015   MCV 87.2 06/28/2015   PLT 272 06/28/2015    Lab Results  Component Value Date  CREATININE 1.40* 07/25/2015   Imaging Study Result     CLINICAL DATA: Status post thermal ablation of left renal cell carcinoma.  EXAM: CT ABDOMEN WITHOUT AND WITH CONTRAST  TECHNIQUE: Multidetector CT imaging of the abdomen was performed following the standard protocol before and following the bolus administration of intravenous contrast.  CONTRAST: 75 cc of Isovue 370  COMPARISON: 05/24/2015  FINDINGS: Lower chest: Aortic atherosclerosis is identified. Calcification within the LAD and left circumflex coronary artery noted. Moderate volume left pleural effusion identified. There is atelectasis within the lingula and left lower lobe.  Hepatobiliary: No suspicious liver abnormalities identified. Low-attenuation structure within the inferior right lobe of liver measures 9 mm, image 60 of series 9. This is unchanged. Calcifications within the wall of the gallbladder fundus are again noted. There is no biliary dilatation.  Pancreas: No mass or inflammation.  Spleen: The spleen is negative.  Adrenals/Urinary Tract: The adrenal glands are normal. Status post right nephrectomy. Left renal cysts are again noted and appear unchanged. Thermal ablation defect involving the medial aspect of the upper pole of left kidney is identified, image 43 of series 9. No evidence to suggest residual tumor. No perinephric fluid collections and no evidence for renal vein  thrombosis.  Stomach/Bowel: The stomach appears normal. The upper abdominal bowel loops are unremarkable.  Vascular/Lymphatic: Aortic atherosclerosis identified. No enlarged upper abdominal lymph nodes.  Other: No free fluid or fluid collections identified within the upper abdomen.  Musculoskeletal: No aggressive lytic or sclerotic bone lesions.  IMPRESSION: 1. Status post thermal ablation of left upper pole renal cell carcinoma. No evidence for residual or recurrence of tumor. 2. New left pleural effusion. 3. Aortic atherosclerosis.   Electronically Signed  By: Kerby Moors M.D.  On: 07/25/2015 13:51      Assessment & Plan:   1. Right renal mass x 2 s/p right robotic nephrectomy in 01/2015  2. Left renal mass x 1 S/p left thermal ablation 04/2015 NED on CT 07/25/15 Recommend repeat imaging in 6 months  3. Pleural effusion  New since microwave ablation ? Reactive Asymptomatic with normal 02 sats today Asked patient to f/u with her PCP to assess for possible need for diuresis vs other intervention, will send Dr. Caryl Comes a note  4. Solitary kidney Solitary kidney precautions reviewed today  5. Chronic renal insuffiencey, stage 3 We'll continue to monitor renal function  Plan for follow-up in 6 months likely with repeat imaging   Hollice Espy, MD  [facility] [address] [place], [place] [address] [phone]

## 2015-08-07 DIAGNOSIS — C642 Malignant neoplasm of left kidney, except renal pelvis: Secondary | ICD-10-CM | POA: Diagnosis not present

## 2015-08-07 DIAGNOSIS — I10 Essential (primary) hypertension: Secondary | ICD-10-CM | POA: Diagnosis not present

## 2015-08-07 DIAGNOSIS — C50911 Malignant neoplasm of unspecified site of right female breast: Secondary | ICD-10-CM | POA: Diagnosis not present

## 2015-08-07 DIAGNOSIS — N183 Chronic kidney disease, stage 3 (moderate): Secondary | ICD-10-CM | POA: Diagnosis not present

## 2015-08-07 DIAGNOSIS — J90 Pleural effusion, not elsewhere classified: Secondary | ICD-10-CM | POA: Diagnosis not present

## 2015-08-07 DIAGNOSIS — C50912 Malignant neoplasm of unspecified site of left female breast: Secondary | ICD-10-CM | POA: Diagnosis not present

## 2015-08-07 DIAGNOSIS — E784 Other hyperlipidemia: Secondary | ICD-10-CM | POA: Diagnosis not present

## 2015-08-07 DIAGNOSIS — M4854XS Collapsed vertebra, not elsewhere classified, thoracic region, sequela of fracture: Secondary | ICD-10-CM | POA: Diagnosis not present

## 2015-08-07 DIAGNOSIS — Z853 Personal history of malignant neoplasm of breast: Secondary | ICD-10-CM | POA: Insufficient documentation

## 2015-08-22 DIAGNOSIS — J90 Pleural effusion, not elsewhere classified: Secondary | ICD-10-CM | POA: Diagnosis not present

## 2015-08-26 ENCOUNTER — Other Ambulatory Visit: Payer: Self-pay | Admitting: Internal Medicine

## 2015-08-26 DIAGNOSIS — J90 Pleural effusion, not elsewhere classified: Secondary | ICD-10-CM

## 2015-08-28 ENCOUNTER — Ambulatory Visit
Admission: RE | Admit: 2015-08-28 | Discharge: 2015-08-28 | Disposition: A | Discharge status: Home / Self Care | Payer: PPO | Source: Ambulatory Visit | Attending: Internal Medicine | Admitting: Internal Medicine

## 2015-08-28 ENCOUNTER — Other Ambulatory Visit: Payer: Self-pay | Admitting: Internal Medicine

## 2015-08-28 DIAGNOSIS — I700 Atherosclerosis of aorta: Secondary | ICD-10-CM | POA: Insufficient documentation

## 2015-08-28 DIAGNOSIS — Z9889 Other specified postprocedural states: Secondary | ICD-10-CM | POA: Insufficient documentation

## 2015-08-28 DIAGNOSIS — J90 Pleural effusion, not elsewhere classified: Secondary | ICD-10-CM | POA: Diagnosis not present

## 2015-08-28 NOTE — Progress Notes (Signed)
Interventional Radiology Progress Note   Ms Speranza presents for a left thoracentesis, occuring as a sympathetic effusion after a left renal mass ablation performed in January 2017.    The size of the fluid is clearly decreased from the abdominal CT performed March 2017.  On [place] today, there is only trace fluid.    Ms Hutzel remains asymptomatic, with no fever, rigors, chills, pleurisy, DOE, or SOB.    I explained that there is more likely a higher risk than benefit for attempt at aspiration of trace fluid.  She agrees.    If fluid recurs/increases, we can attempt thora.  She will follow up with her scheduled appointments.   Call with questions/concerns.    Signed,  Dulcy Fanny. Earleen [place], DO

## 2015-09-09 ENCOUNTER — Other Ambulatory Visit: Payer: PPO

## 2015-09-09 ENCOUNTER — Ambulatory Visit: Payer: PPO

## 2015-09-12 ENCOUNTER — Ambulatory Visit
Admission: RE | Admit: 2015-09-12 | Discharge: 2015-09-12 | Disposition: A | Discharge status: Home / Self Care | Payer: PPO | Source: Ambulatory Visit | Attending: Internal Medicine | Admitting: Internal Medicine

## 2015-09-12 DIAGNOSIS — D0511 Intraductal carcinoma in situ of right breast: Secondary | ICD-10-CM

## 2015-09-12 DIAGNOSIS — R928 Other abnormal and inconclusive findings on diagnostic imaging of breast: Secondary | ICD-10-CM | POA: Diagnosis not present

## 2015-09-12 DIAGNOSIS — D0591 Unspecified type of carcinoma in situ of right breast: Secondary | ICD-10-CM | POA: Insufficient documentation

## 2015-09-12 HISTORY — DX: Malignant neoplasm of unspecified site of unspecified female breast: C50.919

## 2015-09-27 ENCOUNTER — Ambulatory Visit: Payer: PPO | Admitting: Urology

## 2015-12-13 DIAGNOSIS — H02105 Unspecified ectropion of left lower eyelid: Secondary | ICD-10-CM | POA: Diagnosis not present

## 2015-12-27 ENCOUNTER — Other Ambulatory Visit: Payer: PPO

## 2015-12-27 ENCOUNTER — Ambulatory Visit: Payer: PPO | Admitting: Hematology and Oncology

## 2016-01-02 ENCOUNTER — Other Ambulatory Visit: Payer: PPO

## 2016-01-02 ENCOUNTER — Ambulatory Visit: Payer: PPO | Admitting: Hematology and Oncology

## 2016-01-09 DIAGNOSIS — N183 Chronic kidney disease, stage 3 (moderate): Secondary | ICD-10-CM | POA: Diagnosis not present

## 2016-01-09 DIAGNOSIS — I10 Essential (primary) hypertension: Secondary | ICD-10-CM | POA: Diagnosis not present

## 2016-01-16 DIAGNOSIS — K219 Gastro-esophageal reflux disease without esophagitis: Secondary | ICD-10-CM | POA: Diagnosis not present

## 2016-01-16 DIAGNOSIS — J90 Pleural effusion, not elsewhere classified: Secondary | ICD-10-CM | POA: Diagnosis not present

## 2016-01-16 DIAGNOSIS — N183 Chronic kidney disease, stage 3 (moderate): Secondary | ICD-10-CM | POA: Diagnosis not present

## 2016-01-16 DIAGNOSIS — E784 Other hyperlipidemia: Secondary | ICD-10-CM | POA: Diagnosis not present

## 2016-01-16 DIAGNOSIS — C50911 Malignant neoplasm of unspecified site of right female breast: Secondary | ICD-10-CM | POA: Diagnosis not present

## 2016-01-16 DIAGNOSIS — I700 Atherosclerosis of aorta: Secondary | ICD-10-CM | POA: Diagnosis not present

## 2016-01-16 DIAGNOSIS — Z780 Asymptomatic menopausal state: Secondary | ICD-10-CM | POA: Diagnosis not present

## 2016-01-16 DIAGNOSIS — I10 Essential (primary) hypertension: Secondary | ICD-10-CM | POA: Diagnosis not present

## 2016-01-16 DIAGNOSIS — C642 Malignant neoplasm of left kidney, except renal pelvis: Secondary | ICD-10-CM | POA: Diagnosis not present

## 2016-01-16 DIAGNOSIS — C50912 Malignant neoplasm of unspecified site of left female breast: Secondary | ICD-10-CM | POA: Diagnosis not present

## 2016-01-22 ENCOUNTER — Telehealth (HOSPITAL_COMMUNITY): Payer: Self-pay | Admitting: Radiology

## 2016-01-22 NOTE — Telephone Encounter (Signed)
Phoned patient to schedule 9 mo follow up Thermal Ablation of Left Renal Cell Carcinoma.   Patient states that Dr. Erlene Quan has already scheduled follow up CT and will have a follow up office appointment.  She also states that Dr. Erlene Quan is taking care of everything and she does not see the need to follow up in our office or drive to [facility].  Marly Schuld Riki Rusk, RN 01/22/2016 4:15 PM

## 2016-02-06 ENCOUNTER — Ambulatory Visit
Admission: RE | Admit: 2016-02-06 | Discharge: 2016-02-06 | Disposition: A | Discharge status: Home / Self Care | Payer: PPO | Source: Ambulatory Visit | Attending: Urology | Admitting: Urology

## 2016-02-06 DIAGNOSIS — I700 Atherosclerosis of aorta: Secondary | ICD-10-CM | POA: Diagnosis not present

## 2016-02-06 DIAGNOSIS — D3502 Benign neoplasm of left adrenal gland: Secondary | ICD-10-CM | POA: Diagnosis not present

## 2016-02-06 DIAGNOSIS — K829 Disease of gallbladder, unspecified: Secondary | ICD-10-CM | POA: Insufficient documentation

## 2016-02-06 DIAGNOSIS — Z08 Encounter for follow-up examination after completed treatment for malignant neoplasm: Secondary | ICD-10-CM | POA: Diagnosis not present

## 2016-02-06 DIAGNOSIS — Z85528 Personal history of other malignant neoplasm of kidney: Secondary | ICD-10-CM | POA: Insufficient documentation

## 2016-02-06 DIAGNOSIS — Z905 Acquired absence of kidney: Secondary | ICD-10-CM | POA: Diagnosis not present

## 2016-02-06 HISTORY — DX: Malignant neoplasm of right kidney, except renal pelvis: C64.1

## 2016-02-06 HISTORY — DX: Disorder of kidney and ureter, unspecified: N28.9

## 2016-02-06 HISTORY — DX: Malignant neoplasm of unspecified site of right female breast: C50.911

## 2016-02-06 MED ORDER — IOPAMIDOL (ISOVUE-370) INJECTION 76%
75.0000 mL | Freq: Once | INTRAVENOUS | Status: AC | PRN
  Administered 2016-02-06: 75 mL via INTRAVENOUS

## 2016-02-07 ENCOUNTER — Ambulatory Visit (INDEPENDENT_AMBULATORY_CARE_PROVIDER_SITE_OTHER): Payer: PPO | Admitting: Urology

## 2016-02-07 ENCOUNTER — Other Ambulatory Visit: Payer: Self-pay | Admitting: *Deleted

## 2016-02-07 ENCOUNTER — Encounter: Payer: Self-pay | Admitting: Urology

## 2016-02-07 VITALS — BP 169/89 | HR 58 | Ht 62.0 in | Wt 173.0 lb

## 2016-02-07 DIAGNOSIS — IMO0002 Reserved for concepts with insufficient information to code with codable children: Secondary | ICD-10-CM

## 2016-02-07 DIAGNOSIS — Q600 Renal agenesis, unilateral: Secondary | ICD-10-CM | POA: Diagnosis not present

## 2016-02-07 DIAGNOSIS — D3000 Benign neoplasm of unspecified kidney: Secondary | ICD-10-CM

## 2016-02-07 DIAGNOSIS — N183 Chronic kidney disease, stage 3 unspecified: Secondary | ICD-10-CM

## 2016-02-07 DIAGNOSIS — D0510 Intraductal carcinoma in situ of unspecified breast: Secondary | ICD-10-CM

## 2016-02-07 NOTE — Progress Notes (Signed)
10:30 AM  02/07/16  Tara Ramos November 30, 1931 [id number]  Referring provider: Adin Hector, MD [facility] [place], [place] [address]  No chief complaint on file.   HPI: 82 year old with bilateral renal masses s/p right robotic nephrectomy in [place] by Dr. Alyson Ramos in 02/01/15 and CT guided microwave ablation of the left renal mass 05/24/15.  RCC history Initial imaging by MRI to confirm tthe size and location of these lesions. She was noted to have a 4.5 x 2.8 x 2.2 cm RIGHT medial  enhancing renal mass along with a 1.6 cm enhancing inferior pole renal mass. On the LEFT, she was found to have a 1.9 cm LEFT enhancing renal mass as well.    She does have a history of breast cancer, first diagnosed in 1990 status post lumpectomy and radiation. She developed recurrent disease in the same breast in 2014 at which time she underwent lumpectomy and no other therapy needed.  CT of the chest 08/17/14 showed no evidence for lung metastases but an incidental T7 compression fracture concerning was noted of which metastatic involvement not excluded. Bone scan was also somewhat concerning for pathological fracture. She most recently underwent a kyphoplasty with biopsy with was NEGATIVE for malignancy. She was also noted to have an incidental 11 mm left thyroid nodule.  Given her history of various malignancies and other radiographic findings, she did undergo biopsy of the right upper pole renal lesion which was indeed consistent with RCC.  Creatinine on 07/2014 0.96 preop.   Most recent creatinine on 3/30//2017 was 1.40.  She underwent R robotic  Nephrectomy per Dr. Alyson Ramos on 02/01/2015.  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 The inferior pole lesion was not consistent with malignancy.  No issues postoperatively.   She then underwent successful CT-guided microwave ablation of her left renal lesion on  05/24/2015.  This was complicated by a left pleural effusion which resolved spontaneously.   She returns today after follow-up CT scan performed yesterday and remains NED.    PMH: Past Medical History:  Diagnosis Date  . Arthritis   . Breast cancer ([place]) 1992   right breast  . Breast cancer ([place]) 1990   left breast  . Breast cancer ([place]) 2014   right breast  . Breast cancer, right breast ([place]) 12/16/2012   left, then right, then right breast cancers (lumpectomies and radiation therapy)  . Chronic kidney disease   . Dysrhythmia    HX A FIB - FOLLOWED BY [name]  . GERD (gastroesophageal reflux disease)   . Heart murmur    hx of years ago   . Hemorrhoids   . Hypertension   . Renal cell cancer, right ([place]) 11/2014   Right Nephrectomy and Left renal ablation for lesion.   . Renal insufficiency   . Status post radiation therapy    breast cancer bilateral    Surgical History: Past Surgical History:  Procedure Laterality Date  . BREAST BIOPSY Right 2014   ATYPICAL DUCTAL PROLIFERATION WITH MARKED DISTORTION BY  . BREAST LUMPECTOMY Left 1990  . BREAST LUMPECTOMY Right 1992  . DILATION AND CURETTAGE OF UTERUS    . EYE SURGERY Bilateral    cataract extraction  . KYPHOPLASTY N/A 10/09/2014   Procedure: KYPHOPLASTY;  Surgeon: Hessie Knows, MD;  Location: [facility] ORS;  Service: Orthopedics;  Laterality: N/A;  T7 Kyphoplasty with bone biopsy  . OTHER SURGICAL  HISTORY     radiation therapy breast  . ROBOT ASSISTED LAPAROSCOPIC NEPHRECTOMY Right 02/01/2015   Procedure: ROBOTIC ASSISTED LAPAROSCOPIC RIGHT NEPHRECTOMY;  Surgeon: Cleon Gustin, MD;  Location: [facility];  Service: Urology;  Laterality: Right;    Home Medications:    Medication List       Accurate as of 02/07/16 10:30 AM. Always use your most recent med list.          amLODipine 5 MG tablet Commonly known as:  NORVASC Take 7.5 mg by mouth every morning.   Calcium + D3 600-200 MG-UNIT Tabs Take 1 tablet by  mouth daily.   docusate sodium 100 MG capsule Commonly known as:  COLACE Take 100 mg by mouth at bedtime.   GLUCOSAMINE HCL PO Take 1,000 mg by mouth daily.   hydrochlorothiazide 25 MG tablet Commonly known as:  HYDRODIURIL Take 50 mg by mouth daily.   HYDROcodone-acetaminophen 5-325 MG tablet Commonly known as:  NORCO/VICODIN Take 1-2 tablets by mouth every 4 (four) hours as needed for moderate pain.   letrozole 2.5 MG tablet Commonly known as:  FEMARA Take 1 tablet (2.5 mg total) by mouth every morning.   losartan 50 MG tablet Commonly known as:  COZAAR Take 50 mg by mouth every morning.   metoprolol 100 MG tablet Commonly known as:  LOPRESSOR Take 100 mg by mouth 2 (two) times daily.   multivitamin with minerals Tabs tablet Take 1 tablet by mouth daily.   omeprazole 20 MG capsule Commonly known as:  PRILOSEC Take 20 mg by mouth every morning.   vitamin C 1000 MG tablet Take 1,000 mg by mouth daily.       Allergies:  Allergies  Allergen Reactions  . Morphine And Related Nausea And Vomiting  . Lisinopril Cough  . Tape Other (See Comments)    Blisters underneath; wide white hypofix tape post kidney biopsy     Family History: Family History  Problem Relation Age of Onset  . Stroke Father   . Stomach cancer Mother   . Brain cancer Sister     Social History:  reports that she quit smoking about 43 years ago. Her smoking use included Cigarettes. She quit after 20.00 years of use. She has never used smokeless tobacco. She reports that she drinks about 0.6 oz of alcohol per week . She reports that she does not use drugs.   Physical Exam: There were no vitals taken for this visit.  O2 sat 98% Constitutional:  Alert and oriented, No acute distress. HEENT: [place] AT, moist mucus membranes.  Trachea midline, no masses. Cardiovascular: No clubbing, cyanosis, or edema. Respiratory: Normal respiratory effort, no increased work of breathing. Skin: No rashes, bruises  or suspicious lesions. Neurologic: Grossly intact, no focal deficits, moving all 4 extremities. Psychiatric: Normal mood and affect.  Laboratory Data: Lab Results  Component Value Date   WBC 7.7 06/28/2015   HGB 14.6 06/28/2015   HCT 43.1 06/28/2015   MCV 87.2 06/28/2015   PLT 272 06/28/2015    Lab Results  Component Value Date   CREATININE 1.40 (H) 07/25/2015   Imaging CLINICAL DATA:  82 year old female with history or renal cell carcinoma right status post right nephrectomy in October 2016, followed by thermal ablation of a left renal lesion in 2017. Currently asymptomatic. Additional history of bilateral breast cancer status post lumpectomy and radiation therapy.  EXAM: CT ABDOMEN WITHOUT AND WITH CONTRAST  TECHNIQUE: Multidetector CT imaging of the abdomen was performed following the  standard protocol before and following the bolus administration of intravenous contrast.  CONTRAST:  75 mL of Isovue 370.  COMPARISON:  CT the abdomen 07/25/2015.  FINDINGS: Lower chest: Cardiomegaly. Atherosclerotic calcifications in the left anterior descending coronary artery.  Hepatobiliary: Sub cm low-attenuation lesion in the right lobe of the liver (image 52 of series 3) is too small to definitively characterize, but is stable compared to the prior examination, likely a tiny cyst. Some nondependent calcification is again noted in the anterior aspect of the fundal portion of the gallbladder wall. Gallbladder is otherwise normal in appearance. No intra or extrahepatic biliary ductal dilatation.  Pancreas: No pancreatic mass. No pancreatic ductal dilatation. No pancreatic or peripancreatic fluid or inflammatory changes.  Spleen: Unremarkable.  Adrenals/Urinary Tract: Status post right radical nephrectomy. No findings to suggest local recurrence of disease in the nephrectomy bed. Postprocedural changes of thermal ablation are again noted in the medial aspect of the  upper poles of the left kidney, with no definite enhancing soft tissue in this region to strongly suggest local recurrence of disease. Several small sub cm low-attenuation lesions in the left kidney are too small to definitively characterize, but are similar to the prior examination, likely cysts. 1.6 cm exophytic simple cyst in the upper pole of the left kidney is also similar to the prior examination. No hydroureteronephrosis in the visualized portions of the abdomen. 1.8 x 1.1 cm low-attenuation (-1 Hounsfield unit) left adrenal nodule is compatible with an adenoma. Mild adreniform thickening of the right adrenal gland is unchanged.  Stomach/Bowel: Normal appearance of the stomach. No pathologic dilatation of the visualized portions of small bowel or colon.  Vascular/Lymphatic: Aortic atherosclerosis, without definite aneurysm or dissection in the visualized abdominal vasculature. Single left renal artery with atherosclerotic plaque causing only mild narrowing at the ostium. Left renal vein is widely patent. No lymphadenopathy noted in the abdomen.  Other: No significant volume of ascites noted in the visualized portions of the peritoneal cavity.  Musculoskeletal: There are no aggressive appearing lytic or blastic lesions noted in the visualized portions of the skeleton.  IMPRESSION: 1. Status post right nephrectomy and thermal ablation in the medial aspect of the upper pole the left kidney, without evidence to suggest local recurrence of disease or definite metastatic disease in the abdomen. 2. Small left adrenal adenoma is unchanged. 3. Aortic atherosclerosis. 4. Nondependent calcifications along the anterior wall of the gallbladder in the region of the fundus, stable compared to prior examinations dating back to at least 08/01/2014. This could represent adherent calcified gallstones, or could be a manifestation of porcelain gallbladder. No discrete gallbladder mass  identified at this time.   Electronically Signed   By: Vinnie Langton M.D.   On: 02/06/2016 09:34   CT scan personally reviewed today  Assessment & Plan:   1. Right renal mass x 2 s/p right robotic nephrectomy in 01/2015 NED  2. Left renal mass x 1 S/p left thermal ablation 04/2015 NED on CT 01/27/16 Recommend repeat imaging in 12 months given low risk disease and no recurrence to date  3. Solitary kidney Solitary kidney precautions reviewed today  4. Chronic renal insuffiencey, stage 3 We'll continue to monitor renal function  Plan for follow-up in 12 months likely with repeat imaging   Hollice Espy, MD  [facility] [address], [place], [place] [address] [phone]

## 2016-02-09 NOTE — Progress Notes (Signed)
[facility] day:  02/10/2016  Chief Complaint: Tara Ramos is a 82 y.o. female with bilateral breast cancer (1990 and 1992) and bilateral renal cell carcinoma (2016) who is seen for reassessment.  HPI:  The patient has a history of left breast cancer diagnosed in 1990 s/p lumpectomy and radiation.  She presented with an abnormal screening mammogram in [place].  She was then diagnosed with right breast cancer after another abnormal mammogram.   She underwent right breast lumpectomy and radiation in 1992 in [place].  She was on tamoxifen x 5 years.  She developed recurrent disease in the right breast in 2014.  She underwent lumpectomy on 12/16/2012 by Dr. Hervey Ard.  Pathology revealed a 1.1 cm grade II invasive mammary carcinoma with DCIS.  Margins were negative.  Tumor was ER + (> 90%), PR+ (10%), and Her2/neu negative FISH.  Pathologic stage was T1cNxM0.  She began Femara in 12/2012.   She is tolerating it well.  Mammogram on 09/12/2015 revealed no malignancy.  She presented with renal cell carcinoma in 2016.  She describes falling on wet steps and hitting her back.  CT scan revealed a 4.5 x 2.8 x 2.2 cm RIGHT medial  enhancing renal mass along with a 1.6 cm enhancing inferior pole renal mass. On the LEFT, she was noted to have a 1.9 cm LEFT enhancing renal mass.  CT guided right renal biopsy on 10/31/2014 revealed renal cell carcinoma with eosinophilic cytoplasm and rhabdoid features, Fuhrman grade 4.   No biopsy was performed on the left kidney.   She underwent right robotic nephrectomy by Dr. Alyson Ingles on 02/01/2015 [facility].  Pathology revealed a 4 cm clear cell renal cell carcinoma, Fuhrman grade 4 of the right upper pole.  There was no lymph-vascular invasion. The inferior pole lesion was not consistent with malignancy.  Pathologic stage was T1aNxM0 (stage I).  She tolerated her surgery well.  She underwent CT-guided microwave ablation  of her left renal lesion on 05/24/2015.  The procedure was complicated by a left pleural effusion (resolved spontaneously).  Abdominal CT scan on 02/06/2016 revealed no evidence of recurrent disease.    Bone density study 2 years ago showed osteopenia.    She takes calcium and vitamin D.   Chest CT on 08/17/2014 revealed no evidence for lung metastases but an incidental T7 compression fracture concerning for metastatic disease.  Bone scan was concerning for pathological fracture.  She underwent a kyphoplasty with biopsy on 10/09/2014.  Pathology was negative for malignancy. She had an incidental 11 mm left thyroid nodule.  Creatinine was 0.96 in 07/2014.   Creatinine was 1.40 on 07/25/2015.  Symptomatically, she feesl fine.  She denies any concerns.   Past Medical History:  Diagnosis Date  . Arthritis   . Breast cancer ([place]) 1992   right breast  . Breast cancer ([place]) 1990   left breast  . Breast cancer ([place]) 2014   right breast  . Breast cancer, right breast ([place]) 12/16/2012   left, then right, then right breast cancers (lumpectomies and radiation therapy)  . Chronic kidney disease   . Dysrhythmia    HX A FIB - FOLLOWED BY [name]  . GERD (gastroesophageal reflux disease)   . Heart murmur    hx of years ago   . Hemorrhoids   . Hypertension   . Renal cell cancer, right ([place]) 11/2014   Right Nephrectomy and Left renal ablation for lesion.   Marland Kitchen  Renal insufficiency   . Status post radiation therapy    breast cancer bilateral    Past Surgical History:  Procedure Laterality Date  . BREAST BIOPSY Right 2014   ATYPICAL DUCTAL PROLIFERATION WITH MARKED DISTORTION BY  . BREAST LUMPECTOMY Left 1990  . BREAST LUMPECTOMY Right 1992  . DILATION AND CURETTAGE OF UTERUS    . EYE SURGERY Bilateral    cataract extraction  . KYPHOPLASTY N/A 10/09/2014   Procedure: KYPHOPLASTY;  Surgeon: Hessie Knows, MD;  Location: [facility] ORS;  Service: Orthopedics;  Laterality: N/A;  T7 Kyphoplasty  with bone biopsy  . OTHER SURGICAL HISTORY     radiation therapy breast  . ROBOT ASSISTED LAPAROSCOPIC NEPHRECTOMY Right 02/01/2015   Procedure: ROBOTIC ASSISTED LAPAROSCOPIC RIGHT NEPHRECTOMY;  Surgeon: Cleon Gustin, MD;  Location: [facility];  Service: Urology;  Laterality: Right;    Family History  Problem Relation Age of Onset  . Stomach cancer Mother   . Stroke Father   . Brain cancer Sister   . Bladder Cancer Neg Hx   . Prostate cancer Neg Hx   . Kidney cancer Neg Hx     Social History:  reports that she quit smoking about 43 years ago. Her smoking use included Cigarettes. She quit after 20.00 years of use. She has never used smokeless tobacco. She reports that she drinks about 0.6 oz of alcohol per week . She reports that she does not use drugs.  She has worked for [employer].  Previously lived in [place] .  She has lived in [place] x 23 years. The patient is alone today.  Allergies:  Allergies  Allergen Reactions  . Morphine And Related Nausea And Vomiting  . Lisinopril Cough  . Tape Other (See Comments)    Blisters underneath; wide white hypofix tape post kidney biopsy     Current Medications: Current Outpatient Prescriptions  Medication Sig Dispense Refill  . amLODipine (NORVASC) 5 MG tablet Take 7.5 mg by mouth every morning.    . Ascorbic Acid (VITAMIN C) 1000 MG tablet Take 1,000 mg by mouth daily.    . Calcium Carb-Cholecalciferol (CALCIUM + D3) 600-200 MG-UNIT TABS Take 1 tablet by mouth daily.     Marland Kitchen docusate sodium (COLACE) 100 MG capsule Take 100 mg by mouth at bedtime.    Marland Kitchen GLUCOSAMINE HCL PO Take 1,000 mg by mouth daily.    . hydrochlorothiazide (HYDRODIURIL) 25 MG tablet Take 50 mg by mouth daily.    Marland Kitchen letrozole (FEMARA) 2.5 MG tablet Take 1 tablet (2.5 mg total) by mouth every morning. 30 tablet 12  . losartan (COZAAR) 50 MG tablet Take 50 mg by mouth every morning.    . metoprolol (LOPRESSOR) 100 MG tablet Take 100 mg by mouth 2 (two) times  daily.    . Multiple Vitamin (MULTIVITAMIN WITH MINERALS) TABS tablet Take 1 tablet by mouth daily.    Marland Kitchen omeprazole (PRILOSEC) 20 MG capsule Take 20 mg by mouth every morning.      No current facility-administered medications for this visit.     Review of Systems:  GENERAL:  Feels fine.  Active.  No fevers, sweats or weight loss. PERFORMANCE STATUS (ECOG):  0 HEENT:  No visual changes, runny nose, sore throat, mouth sores or tenderness. Lungs: No shortness of breath or cough.  No hemoptysis. Cardiac:  No chest pain, palpitations, orthopnea, or PND. GI:  No nausea, vomiting, diarrhea, constipation, melena or hematochezia. GU:  No urgency, frequency, dysuria, or hematuria.  Musculoskeletal:  No back pain.  No joint pain.  No muscle tenderness. Extremities:  No pain or swelling. Skin:  No rashes or skin changes. Neuro:  No headache, numbness or weakness, balance or coordination issues. Endocrine:  No diabetes, thyroid issues, hot flashes or night sweats. Psych:  No mood changes, depression or anxiety. Pain:  No focal pain. Review of systems:  All other systems reviewed and found to be negative.  Physical Exam: Blood pressure (!) 149/83, pulse (!) 50, temperature 97.5 F (36.4 C), temperature source Tympanic, resp. rate 18, weight 173 lb 4.5 oz (78.6 kg). GENERAL:  Well developed, well nourished, womansitting comfortably in the exam room in no acute distress. MENTAL STATUS:  Alert and oriented to person, place and time. HEAD:  Pearline Cables hair.  Normocephalic, atraumatic, face symmetric, no Cushingoid features. EYES:  Glasses.  Blue eyes.  Pupils equal round and reactive to light and accomodation.  No conjunctivitis or scleral icterus. ENT:  Oropharynx clear without lesion.  Upper dentures.  Tongue normal. Mucous membranes moist.  RESPIRATORY:  Clear to auscultation without rales, wheezes or rhonchi. CARDIOVASCULAR:  Regular rate and rhythm without murmur, rub or gallop. BREAST:  Right breast  with a 4 cm mass-like area in the outer quadrant.  No skin changes or nipple discharge.  Left breast without masses, skin changes or nipple discharge.  ABDOMEN:  Soft, non-tender, with active bowel sounds, and no hepatosplenomegaly.  No masses. SKIN:  No rashes, ulcers or lesions. EXTREMITIES: No edema, no skin discoloration or tenderness.  No palpable cords. LYMPH NODES: No palpable cervical, supraclavicular, axillary or inguinal adenopathy  NEUROLOGICAL: Unremarkable. PSYCH:  Appropriate.   Orders Only on 02/10/2016  Component Date Value Ref Range Status  . WBC 02/10/2016 6.8  3.6 - 11.0 K/uL Final  . RBC 02/10/2016 4.67  3.80 - 5.20 MIL/uL Final  . Hemoglobin 02/10/2016 13.9  12.0 - 16.0 g/dL Final  . HCT 02/10/2016 40.8  35.0 - 47.0 % Final  . MCV 02/10/2016 87.5  80.0 - 100.0 fL Final  . MCH 02/10/2016 29.8  26.0 - 34.0 pg Final  . MCHC 02/10/2016 34.1  32.0 - 36.0 g/dL Final  . RDW 02/10/2016 13.7  11.5 - 14.5 % Final  . Platelets 02/10/2016 229  150 - 440 K/uL Final  . Neutrophils Relative % 02/10/2016 78  % Final  . Neutro Abs 02/10/2016 5.3  1.4 - 6.5 K/uL Final  . Lymphocytes Relative 02/10/2016 13  % Final  . Lymphs Abs 02/10/2016 0.9* 1.0 - 3.6 K/uL Final  . Monocytes Relative 02/10/2016 7  % Final  . Monocytes Absolute 02/10/2016 0.5  0.2 - 0.9 K/uL Final  . Eosinophils Relative 02/10/2016 3  % Final  . Eosinophils Absolute 02/10/2016 0.2  0 - 0.7 K/uL Final  . Basophils Relative 02/10/2016 1  % Final  . Basophils Absolute 02/10/2016 0.0  0 - 0.1 K/uL Final  . Sodium 02/10/2016 141  135 - 145 mmol/L Final  . Potassium 02/10/2016 4.3  3.5 - 5.1 mmol/L Final  . Chloride 02/10/2016 105  101 - 111 mmol/L Final  . CO2 02/10/2016 31  22 - 32 mmol/L Final  . Glucose, Bld 02/10/2016 106* 65 - 99 mg/dL Final  . BUN 02/10/2016 29* 6 - 20 mg/dL Final  . Creatinine, Ser 02/10/2016 1.59* 0.44 - 1.00 mg/dL Final  . Calcium 02/10/2016 9.3  8.9 - 10.3 mg/dL Final  . Total Protein  02/10/2016 6.2* 6.5 - 8.1 g/dL Final  .  Albumin 02/10/2016 3.7  3.5 - 5.0 g/dL Final  . AST 02/10/2016 17  15 - 41 U/L Final  . ALT 02/10/2016 13* 14 - 54 U/L Final  . Alkaline Phosphatase 02/10/2016 56  38 - 126 U/L Final  . Total Bilirubin 02/10/2016 0.8  0.3 - 1.2 mg/dL Final  . GFR calc non Af Amer 02/10/2016 29* >60 mL/min Final  . GFR calc Af Amer 02/10/2016 34* >60 mL/min Final   Comment: (NOTE) The eGFR has been calculated using the CKD EPI equation. This calculation has not been validated in all clinical situations. eGFR's persistently <60 mL/min signify possible Chronic Kidney Disease.   Georgiann Hahn gap 02/10/2016 5  5 - 15 Final    Assessment:  Tara Ramos is a 82 y.o. female with a history of bilateral breast cancer (1990 and 1992) and bilateral renal cell carcinoma (2016).  She was diagnosed with left breast cancer in 1990 and is s/p lumpectomy and radiation.  She was diagnosed with right breast cancer in 1992 and is s/p lumpectomy and radiation.  She was on tamoxifen x 5 years.  She developed recurrent disease in the right breast in 2014.  She underwent lumpectomy on 12/16/2012.  Pathology revealed a 1.1 cm grade II invasive mammary carcinoma with DCIS.  Margins were negative.  Tumor was ER + (> 90%), PR+ (10%), and Her2/neu negative FISH.  Pathologic stage was T1cNxM0.  She began Femara in 12/2012.   Mammogram on 09/12/2015 revealed no malignancy.  She was diagnosed with renal cell carcinoma in 2016.  CT scan revealed a 4.5 x 2.8 x 2.2 cm right medial enhancing renal mass along with a 1.6 cm enhancing inferior pole renal mass. On the left, there was a 1.9 cm LEFT enhancing renal mass.  CT guided right renal biopsy on 10/31/2014 revealed renal cell carcinoma with eosinophilic cytoplasm and rhabdoid features, Fuhrman grade 4.   No biopsy was performed on the left kidney.   She underwent right robotic nephrectomy on 02/01/2015 [facility].  Pathology revealed a 4 cm clear  cell renal cell carcinoma, Fuhrman grade 4 of the right upper pole.  There was no lymph-vascular invasion. The inferior pole lesion was not consistent with malignancy.  Pathologic stage was T1aNxM0 (stage I).  She underwent CT-guided microwave ablation of her left renal lesion on 05/24/2015.  The procedure was complicated by a left pleural effusion (resolved spontaneously).  Abdominal CT scan on 02/06/2016 revealed no evidence of recurrent disease.    Bone density study 2 years ago showed osteopenia.    She takes calcium and vitamin D.   Chest CT on 08/17/2014 revealed no evidence for lung metastases but an incidental T7 compression fracture concerning for metastatic disease.  Bone scan was concerning for pathological fracture.  She underwent a kyphoplasty with biopsy on 10/09/2014.  Pathology was negative for malignancy. She had an incidental 11 mm left thyroid nodule.  She has renal insufficiency.  Creatinine pre-op was 0.96 in 07/2014.   Creatinine was 1.40 on 07/25/2015 and 1.59 (CrCl 29 ml/min) on 02/10/2016.  Symptomatically, she feesl fine.  She denies any concerns.  Exam reveals a 4 cm mass-like fullness in the right breast (probable post-operative changes).  Plan: 1.  Review diagnosis, staging and management of breast cancer as well as renal cell carcinoma.  Discuss recent mammogram and abdominal CT scan.  Discuss right breast exam. She has a 4 cm mass which is likely due to postoperative changes (first examined by me).  Discuss right breast ultrasound. 2.  Discuss need for follow-up bone density study. 3.  Labs today:  CBC with diff, CMP. 4.  Right breast ultrasound. 5.  Continue Femara. 6.  Follow-up upcoming bone density study. 7.  RTC in 6 months for MD assessment and labs (CBC with diff, CMP, CA27.29).   Lequita Asal, MD  02/10/2016, 12:02 PM

## 2016-02-10 ENCOUNTER — Encounter: Payer: Self-pay | Admitting: Hematology and Oncology

## 2016-02-10 ENCOUNTER — Other Ambulatory Visit: Payer: Self-pay | Admitting: *Deleted

## 2016-02-10 ENCOUNTER — Inpatient Hospital Stay (HOSPITAL_BASED_OUTPATIENT_CLINIC_OR_DEPARTMENT_OTHER): Payer: PPO | Admitting: Hematology and Oncology

## 2016-02-10 ENCOUNTER — Other Ambulatory Visit: Payer: Self-pay

## 2016-02-10 ENCOUNTER — Inpatient Hospital Stay: Payer: PPO | Attending: Hematology and Oncology

## 2016-02-10 VITALS — BP 149/83 | HR 50 | Temp 97.5°F | Resp 18 | Wt 173.3 lb

## 2016-02-10 DIAGNOSIS — Z170 Estrogen receptor positive status [ER+]: Secondary | ICD-10-CM

## 2016-02-10 DIAGNOSIS — Z923 Personal history of irradiation: Secondary | ICD-10-CM | POA: Diagnosis not present

## 2016-02-10 DIAGNOSIS — N189 Chronic kidney disease, unspecified: Secondary | ICD-10-CM | POA: Insufficient documentation

## 2016-02-10 DIAGNOSIS — I129 Hypertensive chronic kidney disease with stage 1 through stage 4 chronic kidney disease, or unspecified chronic kidney disease: Secondary | ICD-10-CM | POA: Diagnosis not present

## 2016-02-10 DIAGNOSIS — Z79811 Long term (current) use of aromatase inhibitors: Secondary | ICD-10-CM | POA: Diagnosis not present

## 2016-02-10 DIAGNOSIS — C642 Malignant neoplasm of left kidney, except renal pelvis: Secondary | ICD-10-CM

## 2016-02-10 DIAGNOSIS — C641 Malignant neoplasm of right kidney, except renal pelvis: Secondary | ICD-10-CM

## 2016-02-10 DIAGNOSIS — C50911 Malignant neoplasm of unspecified site of right female breast: Secondary | ICD-10-CM | POA: Diagnosis not present

## 2016-02-10 DIAGNOSIS — J90 Pleural effusion, not elsewhere classified: Secondary | ICD-10-CM | POA: Diagnosis not present

## 2016-02-10 DIAGNOSIS — C50912 Malignant neoplasm of unspecified site of left female breast: Principal | ICD-10-CM

## 2016-02-10 DIAGNOSIS — M8580 Other specified disorders of bone density and structure, unspecified site: Secondary | ICD-10-CM | POA: Insufficient documentation

## 2016-02-10 DIAGNOSIS — M1990 Unspecified osteoarthritis, unspecified site: Secondary | ICD-10-CM | POA: Insufficient documentation

## 2016-02-10 DIAGNOSIS — K219 Gastro-esophageal reflux disease without esophagitis: Secondary | ICD-10-CM | POA: Insufficient documentation

## 2016-02-10 DIAGNOSIS — Z79899 Other long term (current) drug therapy: Secondary | ICD-10-CM

## 2016-02-10 DIAGNOSIS — Z853 Personal history of malignant neoplasm of breast: Secondary | ICD-10-CM | POA: Diagnosis not present

## 2016-02-10 DIAGNOSIS — D0510 Intraductal carcinoma in situ of unspecified breast: Secondary | ICD-10-CM

## 2016-02-10 DIAGNOSIS — Z87891 Personal history of nicotine dependence: Secondary | ICD-10-CM | POA: Insufficient documentation

## 2016-02-10 LAB — CBC WITH DIFFERENTIAL/PLATELET
Basophils Absolute: 0 10*3/uL (ref 0–0.1)
Basophils Relative: 1 %
Eosinophils Absolute: 0.2 10*3/uL (ref 0–0.7)
Eosinophils Relative: 3 %
HCT: 40.8 % (ref 35.0–47.0)
Hemoglobin: 13.9 g/dL (ref 12.0–16.0)
Lymphocytes Relative: 13 %
Lymphs Abs: 0.9 10*3/uL — ABNORMAL LOW (ref 1.0–3.6)
MCH: 29.8 pg (ref 26.0–34.0)
MCHC: 34.1 g/dL (ref 32.0–36.0)
MCV: 87.5 fL (ref 80.0–100.0)
Monocytes Absolute: 0.5 10*3/uL (ref 0.2–0.9)
Monocytes Relative: 7 %
Neutro Abs: 5.3 10*3/uL (ref 1.4–6.5)
Neutrophils Relative %: 78 %
Platelets: 229 10*3/uL (ref 150–440)
RBC: 4.67 MIL/uL (ref 3.80–5.20)
RDW: 13.7 % (ref 11.5–14.5)
WBC: 6.8 10*3/uL (ref 3.6–11.0)

## 2016-02-10 LAB — COMPREHENSIVE METABOLIC PANEL
ALT: 13 U/L — ABNORMAL LOW (ref 14–54)
AST: 17 U/L (ref 15–41)
Albumin: 3.7 g/dL (ref 3.5–5.0)
Alkaline Phosphatase: 56 U/L (ref 38–126)
Anion gap: 5 (ref 5–15)
BUN: 29 mg/dL — ABNORMAL HIGH (ref 6–20)
CO2: 31 mmol/L (ref 22–32)
Calcium: 9.3 mg/dL (ref 8.9–10.3)
Chloride: 105 mmol/L (ref 101–111)
Creatinine, Ser: 1.59 mg/dL — ABNORMAL HIGH (ref 0.44–1.00)
GFR calc Af Amer: 34 mL/min — ABNORMAL LOW (ref >60)
GFR calc non Af Amer: 29 mL/min — ABNORMAL LOW (ref >60)
Glucose, Bld: 106 mg/dL — ABNORMAL HIGH (ref 65–99)
Potassium: 4.3 mmol/L (ref 3.5–5.1)
Sodium: 141 mmol/L (ref 135–145)
Total Bilirubin: 0.8 mg/dL (ref 0.3–1.2)
Total Protein: 6.2 g/dL — ABNORMAL LOW (ref 6.5–8.1)

## 2016-02-13 DIAGNOSIS — Z780 Asymptomatic menopausal state: Secondary | ICD-10-CM | POA: Diagnosis not present

## 2016-02-16 DIAGNOSIS — M810 Age-related osteoporosis without current pathological fracture: Secondary | ICD-10-CM | POA: Insufficient documentation

## 2016-02-25 ENCOUNTER — Ambulatory Visit
Admission: RE | Admit: 2016-02-25 | Discharge: 2016-02-25 | Disposition: A | Discharge status: Home / Self Care | Payer: PPO | Source: Ambulatory Visit | Attending: Hematology and Oncology | Admitting: Hematology and Oncology

## 2016-02-25 ENCOUNTER — Ambulatory Visit: Payer: PPO

## 2016-02-25 ENCOUNTER — Other Ambulatory Visit: Payer: Self-pay | Admitting: Hematology and Oncology

## 2016-02-25 ENCOUNTER — Other Ambulatory Visit: Payer: PPO

## 2016-02-25 DIAGNOSIS — C50912 Malignant neoplasm of unspecified site of left female breast: Secondary | ICD-10-CM | POA: Insufficient documentation

## 2016-02-25 DIAGNOSIS — C50911 Malignant neoplasm of unspecified site of right female breast: Secondary | ICD-10-CM

## 2016-02-25 DIAGNOSIS — R928 Other abnormal and inconclusive findings on diagnostic imaging of breast: Secondary | ICD-10-CM | POA: Insufficient documentation

## 2016-02-25 DIAGNOSIS — N6311 Unspecified lump in the right breast, upper outer quadrant: Secondary | ICD-10-CM | POA: Diagnosis not present

## 2016-02-26 ENCOUNTER — Other Ambulatory Visit: Payer: Self-pay | Admitting: Hematology and Oncology

## 2016-02-26 DIAGNOSIS — N6310 Unspecified lump in the right breast, unspecified quadrant: Secondary | ICD-10-CM

## 2016-03-04 ENCOUNTER — Other Ambulatory Visit: Payer: Self-pay | Admitting: Hematology and Oncology

## 2016-03-10 ENCOUNTER — Ambulatory Visit
Admission: RE | Admit: 2016-03-10 | Discharge: 2016-03-10 | Disposition: A | Discharge status: Home / Self Care | Payer: PPO | Source: Ambulatory Visit | Attending: Hematology and Oncology | Admitting: Hematology and Oncology

## 2016-03-10 DIAGNOSIS — N6081 Other benign mammary dysplasias of right breast: Secondary | ICD-10-CM | POA: Diagnosis not present

## 2016-03-10 DIAGNOSIS — N6310 Unspecified lump in the right breast, unspecified quadrant: Secondary | ICD-10-CM

## 2016-03-10 DIAGNOSIS — N641 Fat necrosis of breast: Secondary | ICD-10-CM | POA: Diagnosis not present

## 2016-03-10 HISTORY — PX: BREAST BIOPSY: SHX20

## 2016-03-11 LAB — SURGICAL PATHOLOGY

## 2016-04-16 DIAGNOSIS — M810 Age-related osteoporosis without current pathological fracture: Secondary | ICD-10-CM | POA: Diagnosis not present

## 2016-07-09 DIAGNOSIS — I10 Essential (primary) hypertension: Secondary | ICD-10-CM | POA: Diagnosis not present

## 2016-07-09 DIAGNOSIS — N183 Chronic kidney disease, stage 3 (moderate): Secondary | ICD-10-CM | POA: Diagnosis not present

## 2016-07-09 DIAGNOSIS — C642 Malignant neoplasm of left kidney, except renal pelvis: Secondary | ICD-10-CM | POA: Diagnosis not present

## 2016-07-09 DIAGNOSIS — I700 Atherosclerosis of aorta: Secondary | ICD-10-CM | POA: Diagnosis not present

## 2016-07-16 DIAGNOSIS — I700 Atherosclerosis of aorta: Secondary | ICD-10-CM | POA: Diagnosis not present

## 2016-07-16 DIAGNOSIS — N183 Chronic kidney disease, stage 3 (moderate): Secondary | ICD-10-CM | POA: Diagnosis not present

## 2016-07-16 DIAGNOSIS — I10 Essential (primary) hypertension: Secondary | ICD-10-CM | POA: Diagnosis not present

## 2016-07-16 DIAGNOSIS — Z8781 Personal history of (healed) traumatic fracture: Secondary | ICD-10-CM | POA: Diagnosis not present

## 2016-07-16 DIAGNOSIS — M810 Age-related osteoporosis without current pathological fracture: Secondary | ICD-10-CM | POA: Diagnosis not present

## 2016-07-16 DIAGNOSIS — C50911 Malignant neoplasm of unspecified site of right female breast: Secondary | ICD-10-CM | POA: Diagnosis not present

## 2016-07-16 DIAGNOSIS — K219 Gastro-esophageal reflux disease without esophagitis: Secondary | ICD-10-CM | POA: Diagnosis not present

## 2016-07-16 DIAGNOSIS — C50912 Malignant neoplasm of unspecified site of left female breast: Secondary | ICD-10-CM | POA: Diagnosis not present

## 2016-07-16 DIAGNOSIS — E784 Other hyperlipidemia: Secondary | ICD-10-CM | POA: Diagnosis not present

## 2016-07-16 DIAGNOSIS — C642 Malignant neoplasm of left kidney, except renal pelvis: Secondary | ICD-10-CM | POA: Diagnosis not present

## 2016-08-10 ENCOUNTER — Telehealth: Payer: Self-pay | Admitting: *Deleted

## 2016-08-10 ENCOUNTER — Inpatient Hospital Stay (HOSPITAL_BASED_OUTPATIENT_CLINIC_OR_DEPARTMENT_OTHER): Payer: PPO | Admitting: Hematology and Oncology

## 2016-08-10 ENCOUNTER — Inpatient Hospital Stay: Payer: PPO | Attending: Hematology and Oncology

## 2016-08-10 ENCOUNTER — Encounter: Payer: Self-pay | Admitting: Hematology and Oncology

## 2016-08-10 VITALS — BP 206/93 | HR 50 | Temp 96.2°F | Resp 18 | Wt 174.2 lb

## 2016-08-10 DIAGNOSIS — Z9223 Personal history of estrogen therapy: Secondary | ICD-10-CM

## 2016-08-10 DIAGNOSIS — Z853 Personal history of malignant neoplasm of breast: Secondary | ICD-10-CM | POA: Insufficient documentation

## 2016-08-10 DIAGNOSIS — I4891 Unspecified atrial fibrillation: Secondary | ICD-10-CM | POA: Insufficient documentation

## 2016-08-10 DIAGNOSIS — K219 Gastro-esophageal reflux disease without esophagitis: Secondary | ICD-10-CM | POA: Diagnosis not present

## 2016-08-10 DIAGNOSIS — Z923 Personal history of irradiation: Secondary | ICD-10-CM | POA: Diagnosis not present

## 2016-08-10 DIAGNOSIS — Z170 Estrogen receptor positive status [ER+]: Secondary | ICD-10-CM | POA: Diagnosis not present

## 2016-08-10 DIAGNOSIS — Z87891 Personal history of nicotine dependence: Secondary | ICD-10-CM | POA: Insufficient documentation

## 2016-08-10 DIAGNOSIS — E041 Nontoxic single thyroid nodule: Secondary | ICD-10-CM | POA: Insufficient documentation

## 2016-08-10 DIAGNOSIS — Z905 Acquired absence of kidney: Secondary | ICD-10-CM | POA: Diagnosis not present

## 2016-08-10 DIAGNOSIS — C50912 Malignant neoplasm of unspecified site of left female breast: Principal | ICD-10-CM

## 2016-08-10 DIAGNOSIS — N189 Chronic kidney disease, unspecified: Secondary | ICD-10-CM | POA: Diagnosis not present

## 2016-08-10 DIAGNOSIS — C50911 Malignant neoplasm of unspecified site of right female breast: Secondary | ICD-10-CM

## 2016-08-10 DIAGNOSIS — M810 Age-related osteoporosis without current pathological fracture: Secondary | ICD-10-CM

## 2016-08-10 DIAGNOSIS — Z79899 Other long term (current) drug therapy: Secondary | ICD-10-CM | POA: Diagnosis not present

## 2016-08-10 DIAGNOSIS — Z85528 Personal history of other malignant neoplasm of kidney: Secondary | ICD-10-CM | POA: Diagnosis not present

## 2016-08-10 DIAGNOSIS — I129 Hypertensive chronic kidney disease with stage 1 through stage 4 chronic kidney disease, or unspecified chronic kidney disease: Secondary | ICD-10-CM | POA: Insufficient documentation

## 2016-08-10 DIAGNOSIS — C641 Malignant neoplasm of right kidney, except renal pelvis: Secondary | ICD-10-CM

## 2016-08-10 NOTE — Telephone Encounter (Signed)
Instructed patient that BP was elevated and that it was recommended that she go to the ED and patient refused.  Stated "I check it at home, it is fine, it is just this place".  Refused further treatment

## 2016-08-10 NOTE — Progress Notes (Signed)
Patient refused to have labs drawn today.  States she has stopped Fosamax and Femara.  Does not sleep well.  Otherwise, no complaints.

## 2016-08-10 NOTE — Patient Instructions (Signed)

## 2016-08-10 NOTE — Progress Notes (Signed)
[facility] day:  08/10/2016  Chief Complaint: Tara Ramos is a 83 y.o. female with bilateral breast cancer (1990 and 1992) and bilateral renal cell carcinoma (2016) who is seen for 4 month assessment.  HPI:  The patient was last seen by me in the medical oncology clinic on 02/10/2016.  At that time, she was seen for initial assessment by me.  She denied any concerns.  She was doing well on Femara.  Exam revealed a 4 cm mass-like fullness in the right breast (probable post-operative changes).  Right sided mammogram and ultrasound on 02/25/2016 revealed increased density and spiculation of the right breast upper outer quadrant, middle depth, lumpectomy scar, with possible 1.4 cm mass at the superficial portion of the lumpectomy site.  It was difficult to determine whether this represented maturing lumpectomy scar/fat necrosis or a local recurrence.  She underwent ultrasound guided biopsy of the right breast lesion at 11:00 position on 03/10/2016.  Pathology revealed stromal sclerosis, giant cell reaction, chronic inflammation and fat negative.   Pathology was negative for malignancy.  The patient states that she stopped her Femara.  She was concerned about her decreased bone density.  She stopped Fosamax.  She saw Dr. Gabriel Carina of endocrinology on 04/16/2016.  She was advised about the importance of dealing weightbearing exercises. She was encouraged to take calcium 1200 mg a day and D 1000 units a day. Discussions were held regarding treatment for her osteoporosis. She was advised to consider taking alendronate 70 mg weekly. Patient states that she was on Fosamax for about 3 months and stopped. She previously took Evista for a few years but stopped as it was unaffordable to her.  She feels that she is now sleeping better.  She denies any breast concerns. She denies any abdominal or flank pain. She denies any hematuria.   Past Medical History:  Diagnosis  Date  . Arthritis   . Breast cancer ([facility]) 1992   right breast  . Breast cancer ([facility]) 1990   left breast  . Breast cancer ([facility]) 2014   right breast  . Breast cancer, right breast ([facility]) 12/16/2012   left, then right, then right breast cancers (lumpectomies and radiation therapy)  . Chronic kidney disease   . Dysrhythmia    HX A FIB - FOLLOWED BY [name]  . GERD (gastroesophageal reflux disease)   . Heart murmur    hx of years ago   . Hemorrhoids   . Hypertension   . Renal cell cancer, right ([facility]) 11/2014   Right Nephrectomy and Left renal ablation for lesion.   . Renal insufficiency   . Status post radiation therapy    breast cancer bilateral    Past Surgical History:  Procedure Laterality Date  . BREAST EXCISIONAL BIOPSY Right 2014   +  . BREAST EXCISIONAL BIOPSY Right 1992   +  . BREAST EXCISIONAL BIOPSY Left 1990   +  . BREAST LUMPECTOMY Left 1990  . BREAST LUMPECTOMY Right 1992  . DILATION AND CURETTAGE OF UTERUS    . EYE SURGERY Bilateral    cataract extraction  . KYPHOPLASTY N/A 10/09/2014   Procedure: KYPHOPLASTY;  Surgeon: Hessie Knows, MD;  Location: [facility] ORS;  Service: Orthopedics;  Laterality: N/A;  T7 Kyphoplasty with bone biopsy  . OTHER SURGICAL HISTORY     radiation therapy breast  . ROBOT ASSISTED LAPAROSCOPIC NEPHRECTOMY Right 02/01/2015   Procedure: ROBOTIC ASSISTED LAPAROSCOPIC RIGHT NEPHRECTOMY;  Surgeon: Candee Furbish  McKenzie, MD;  Location: [facility] OR[facility];  Service: Urology;  Laterality: Right;    Family History  Problem Relation Age of Onset  . Stomach cancer Mother   . Stroke Father   . Brain cancer Sister   . Bladder Cancer Neg Hx   . Prostate cancer Neg Hx   . Kidney cancer Neg Hx     Social History:  reports that she quit smoking about 44 years ago. Her smoking use included Cigarettes. She quit after 20.00 years of use. She has never used smokeless tobacco. She reports that she drinks about 0.6 oz of alcohol per week . She reports that she does  not use drugs.  She has worked for [employer].  Previously lived in [place] .  She has lived in [place] x 23 years. The patient is alone today.  Allergies:  Allergies  Allergen Reactions  . Morphine And Related Nausea And Vomiting  . Lisinopril Cough  . Tape Other (See Comments)    Blisters underneath; wide white hypofix tape post kidney biopsy     Current Medications: Current Outpatient Prescriptions  Medication Sig Dispense Refill  . amLODipine (NORVASC) 5 MG tablet Take 7.5 mg by mouth every morning.    . Ascorbic Acid (VITAMIN C) 1000 MG tablet Take 1,000 mg by mouth daily.    . Calcium Carb-Cholecalciferol (CALCIUM + D3) 600-200 MG-UNIT TABS Take 1 tablet by mouth daily.     Marland Kitchen docusate sodium (COLACE) 100 MG capsule Take 100 mg by mouth at bedtime.    Marland Kitchen GLUCOSAMINE HCL PO Take 1,000 mg by mouth daily.    . hydrochlorothiazide (HYDRODIURIL) 25 MG tablet Take 50 mg by mouth daily.    Marland Kitchen losartan (COZAAR) 50 MG tablet Take 50 mg by mouth every morning.    . metoprolol (LOPRESSOR) 100 MG tablet Take 100 mg by mouth 2 (two) times daily.    . Multiple Vitamin (MULTIVITAMIN WITH MINERALS) TABS tablet Take 1 tablet by mouth daily.    Marland Kitchen omeprazole (PRILOSEC) 20 MG capsule Take 20 mg by mouth every morning.     Marland Kitchen letrozole (FEMARA) 2.5 MG tablet Take 1 tablet (2.5 mg total) by mouth every morning. (Patient not taking: Reported on 08/10/2016) 30 tablet 12   No current facility-administered medications for this visit.     Review of Systems:  GENERAL:  Feels fine.  Active.  No fevers, sweats or weight loss. PERFORMANCE STATUS (ECOG):  0 HEENT:  No visual changes, runny nose, sore throat, mouth sores or tenderness. Lungs: No shortness of breath or cough.  No hemoptysis. Cardiac:  No chest pain, palpitations, orthopnea, or PND. GI:  No nausea, vomiting, diarrhea, constipation, melena or hematochezia. GU:  No urgency, frequency, dysuria, or hematuria. Musculoskeletal:  No back  pain.  No joint pain.  No muscle tenderness. Extremities:  No pain or swelling. Skin:  No rashes or skin changes. Neuro:  No headache, numbness or weakness, balance or coordination issues. Endocrine:  No diabetes, thyroid issues, hot flashes or night sweats. Psych:  No mood changes, depression or anxiety. Pain:  No focal pain. Review of systems:  All other systems reviewed and found to be negative.  Physical Exam: Blood pressure (!) 192/78, pulse (!) 52, temperature (!) 96.2 F (35.7 C), temperature source Tympanic, resp. rate 18, weight 174 lb 4 oz (79 kg). GENERAL:  Well developed, well nourished, womansitting comfortably in the exam room in no acute distress. MENTAL STATUS:  Alert and oriented to person,  place and time. HEAD:  Pearline Cables hair.  Normocephalic, atraumatic, face symmetric, no Cushingoid features. EYES:  Glasses.  Blue eyes.  Pupils equal round and reactive to light and accomodation.  No conjunctivitis or scleral icterus. ENT:  Oropharynx clear without lesion.  Upper dentures.  Tongue normal. Mucous membranes moist.  RESPIRATORY:  Clear to auscultation without rales, wheezes or rhonchi. CARDIOVASCULAR:  Regular rate and rhythm without murmur, rub or gallop. BREAST:  Right breast with a 4 cm mass-like area in the outer quadrant.  No skin changes or nipple discharge.  Left breast without masses, skin changes or nipple discharge.  ABDOMEN:  Soft, non-tender, with active bowel sounds, and no hepatosplenomegaly.  No masses. SKIN:  No rashes, ulcers or lesions. EXTREMITIES: No edema, no skin discoloration or tenderness.  No palpable cords. LYMPH NODES: No palpable cervical, supraclavicular, axillary or inguinal adenopathy  NEUROLOGICAL: Unremarkable. PSYCH:  Appropriate.   No visits with results within 3 Day(s) from this visit.  Latest known visit with results is:  Hospital Outpatient Visit on 03/10/2016  Component Date Value Ref Range Status  . SURGICAL PATHOLOGY 03/10/2016     Final                   Value:Surgical Pathology CASE: [id number] PATIENT: Tara Ramos Surgical Pathology Report     SPECIMEN SUBMITTED: A. Breast, right, 11:00, 6 CMFN  CLINICAL HISTORY: Two previous lumpectomies.  Post lumpectomy changes vs recurrence  PRE-OPERATIVE DIAGNOSIS: Same  POST-OPERATIVE DIAGNOSIS: None provided.     DIAGNOSIS: A. RIGHT BREAST, 11:00, 6 CMFN; ULTRASOUND-GUIDED BIOPSY: - STROMAL SCLEROSIS, GIANT CELL REACTION, CHRONIC INFLAMMATION AND FAT NECROSIS. - NEGATIVE FOR MALIGNANCY.   GROSS DESCRIPTION:  A. The specimen is received in a formalin-filled container labeled with the patient's name and right breast 11:00, 6 cm from nipple.  Core pieces: multiple Measurement: aggregate, 1.6 x 0.9 x 0.2 cm Comments: yellow to pink lobulated fibrofatty, marked green  Entirely submitted in cassette(s): 1  Time/Date in fixative: placed in formalin at 10:50 AM on 03/10/2016 cold ischemic time of less than 1 minute Total fixation time: 7 hours   Final Diagno                         sis performed by Delorse Lek, MD.  Electronically signed 03/11/2016 3:30:47PM    The electronic signature indicates that the named Attending Pathologist has evaluated the specimen  Technical component performed at [facility], [address] [place], [place] [address] Lab: [phone] Dir: Darrick Penna. Evette Doffing, MD  Professional component performed at [facility], [facility], [address], [place], [place] [address] Lab: [phone] Dir: Dellia Nims. Rubinas, MD      Assessment:  Tara Ramos is a 83 y.o. female with a history of bilateral breast cancer (1990 and 1992) and bilateral renal cell carcinoma (2016).  She was diagnosed with left breast cancer in 1990 and is s/p lumpectomy and radiation.  She was diagnosed with right breast cancer in 1992 and is s/p lumpectomy and radiation.  She was on tamoxifen x 5 years.  She developed recurrent disease  in the right breast in 2014.  She underwent lumpectomy on 12/16/2012.  Pathology revealed a 1.1 cm grade II invasive mammary carcinoma with DCIS.  Margins were negative.  Tumor was ER + (> 90%), PR+ (10%), and Her2/neu negative FISH.  Pathologic stage was T1cNxM0.  She began Femara in 12/2012.  She stopped Femara.  Mammogram on 09/12/2015  revealed no malignancy.  Right sided mammogram and ultrasound on 02/25/2016 revealed increased density and spiculation of the right breast upper outer quadrant, middle depth, lumpectomy scar, with possible 1.4 cm mass at the superficial portion of the lumpectomy site.  Ultrasound guided biopsy of the right breast lesion at 11:00 position on 03/10/2016 was negative for malignancy.  There was stromal sclerosis, giant cell reaction, chronic inflammation and fat negative.     She was diagnosed with renal cell carcinoma in 2016.  CT scan revealed a 4.5 x 2.8 x 2.2 cm right medial enhancing renal mass along with a 1.6 cm enhancing inferior pole renal mass. On the left, there was a 1.9 cm LEFT enhancing renal mass.  CT guided right renal biopsy on 10/31/2014 revealed renal cell carcinoma with eosinophilic cytoplasm and rhabdoid features, Fuhrman grade 4.   No biopsy was performed on the left kidney.   She underwent right robotic nephrectomy on 02/01/2015 at [facility].  Pathology revealed a 4 cm clear cell renal cell carcinoma, Fuhrman grade 4 of the right upper pole.  There was no lymph-vascular invasion. The inferior pole lesion was not consistent with malignancy.  Pathologic stage was T1aNxM0 (stage I).  She underwent CT-guided microwave ablation of her left renal lesion on 05/24/2015.  The procedure was complicated by a left pleural effusion (resolved spontaneously).  Abdominal CT scan on 02/06/2016 revealed no evidence of recurrent disease.    Bone density study in 01/2016 revealed osteoporosis with a T score of -2.9 in the left femoral neck.  She takes calcium and  vitamin D.  She stopped Fosamax.  Chest CT on 08/17/2014 revealed no evidence for lung metastases but an incidental T7 compression fracture concerning for metastatic disease.  Bone scan was concerning for pathological fracture.  She underwent a kyphoplasty with biopsy on 10/09/2014.  Pathology was negative for malignancy. She had an incidental 11 mm left thyroid nodule.  She has renal insufficiency.  Creatinine pre-op was 0.96 in 07/2014.   Creatinine was 1.40 on 07/25/2015 and 1.59 (CrCl 29 ml/min) on 02/10/2016.  Symptomatically, she denies any concerns. She stopped Fosamax and Femara.  Plan: 1.  Labs today:  CBC with diff, CMP, CA27.29. 2.  Discuss interval mammogram, ultrasound, and biopsy.  No evidence of malignancy. 3.  Discuss patient's decision to discontinue Fosamax and Femara.  Discuss hormonal treatment of previously diagnosed recurrent right breast cancer.  Discuss consideration of Prolia to treat osteoporosis.  Side effects reviewed.  Information provided. 4.  Obtain copy of bone density in 01/2016 from [facility]. 5.  Bilateral diagnostic mammogram 09/11/2016. 6.  RTC in 6 months for MD assessment and labs (CBC with diff, CMP, CA27.29).   Lequita Asal, MD  08/10/2016, 10:43 AM

## 2016-09-03 DIAGNOSIS — M810 Age-related osteoporosis without current pathological fracture: Secondary | ICD-10-CM | POA: Insufficient documentation

## 2016-09-18 ENCOUNTER — Ambulatory Visit
Admission: RE | Admit: 2016-09-18 | Discharge: 2016-09-18 | Disposition: A | Discharge status: Home / Self Care | Payer: PPO | Source: Ambulatory Visit | Attending: Hematology and Oncology | Admitting: Hematology and Oncology

## 2016-09-18 DIAGNOSIS — Z853 Personal history of malignant neoplasm of breast: Secondary | ICD-10-CM | POA: Diagnosis not present

## 2016-09-18 DIAGNOSIS — C50912 Malignant neoplasm of unspecified site of left female breast: Secondary | ICD-10-CM

## 2016-09-18 DIAGNOSIS — C50911 Malignant neoplasm of unspecified site of right female breast: Secondary | ICD-10-CM

## 2016-09-18 DIAGNOSIS — R928 Other abnormal and inconclusive findings on diagnostic imaging of breast: Secondary | ICD-10-CM | POA: Diagnosis not present

## 2016-09-18 DIAGNOSIS — C641 Malignant neoplasm of right kidney, except renal pelvis: Secondary | ICD-10-CM

## 2017-01-14 DIAGNOSIS — I700 Atherosclerosis of aorta: Secondary | ICD-10-CM | POA: Diagnosis not present

## 2017-01-14 DIAGNOSIS — N183 Chronic kidney disease, stage 3 (moderate): Secondary | ICD-10-CM | POA: Diagnosis not present

## 2017-01-14 DIAGNOSIS — K219 Gastro-esophageal reflux disease without esophagitis: Secondary | ICD-10-CM | POA: Diagnosis not present

## 2017-01-14 DIAGNOSIS — I10 Essential (primary) hypertension: Secondary | ICD-10-CM | POA: Diagnosis not present

## 2017-01-21 DIAGNOSIS — C642 Malignant neoplasm of left kidney, except renal pelvis: Secondary | ICD-10-CM | POA: Diagnosis not present

## 2017-01-21 DIAGNOSIS — C50911 Malignant neoplasm of unspecified site of right female breast: Secondary | ICD-10-CM | POA: Diagnosis not present

## 2017-01-21 DIAGNOSIS — I10 Essential (primary) hypertension: Secondary | ICD-10-CM | POA: Diagnosis not present

## 2017-01-21 DIAGNOSIS — N183 Chronic kidney disease, stage 3 (moderate): Secondary | ICD-10-CM | POA: Diagnosis not present

## 2017-01-21 DIAGNOSIS — E784 Other hyperlipidemia: Secondary | ICD-10-CM | POA: Diagnosis not present

## 2017-01-21 DIAGNOSIS — I700 Atherosclerosis of aorta: Secondary | ICD-10-CM | POA: Diagnosis not present

## 2017-01-21 DIAGNOSIS — M810 Age-related osteoporosis without current pathological fracture: Secondary | ICD-10-CM | POA: Diagnosis not present

## 2017-01-21 DIAGNOSIS — M65312 Trigger thumb, left thumb: Secondary | ICD-10-CM | POA: Diagnosis not present

## 2017-01-21 DIAGNOSIS — C50912 Malignant neoplasm of unspecified site of left female breast: Secondary | ICD-10-CM | POA: Diagnosis not present

## 2017-01-21 DIAGNOSIS — Z8781 Personal history of (healed) traumatic fracture: Secondary | ICD-10-CM | POA: Diagnosis not present

## 2017-01-21 DIAGNOSIS — Z0000 Encounter for general adult medical examination without abnormal findings: Secondary | ICD-10-CM | POA: Diagnosis not present

## 2017-01-21 DIAGNOSIS — K219 Gastro-esophageal reflux disease without esophagitis: Secondary | ICD-10-CM | POA: Diagnosis not present

## 2017-01-25 DIAGNOSIS — M65312 Trigger thumb, left thumb: Secondary | ICD-10-CM | POA: Diagnosis not present

## 2017-01-25 DIAGNOSIS — M65351 Trigger finger, right little finger: Secondary | ICD-10-CM | POA: Diagnosis not present

## 2017-02-08 ENCOUNTER — Encounter: Payer: Self-pay | Admitting: Hematology and Oncology

## 2017-02-08 ENCOUNTER — Inpatient Hospital Stay: Payer: PPO

## 2017-02-08 ENCOUNTER — Inpatient Hospital Stay: Payer: PPO | Attending: Hematology and Oncology | Admitting: Hematology and Oncology

## 2017-02-08 VITALS — BP 185/80 | HR 58 | Temp 97.6°F | Wt 178.0 lb

## 2017-02-08 DIAGNOSIS — Z9223 Personal history of estrogen therapy: Secondary | ICD-10-CM | POA: Diagnosis not present

## 2017-02-08 DIAGNOSIS — M810 Age-related osteoporosis without current pathological fracture: Secondary | ICD-10-CM

## 2017-02-08 DIAGNOSIS — Z79899 Other long term (current) drug therapy: Secondary | ICD-10-CM | POA: Diagnosis not present

## 2017-02-08 DIAGNOSIS — C50911 Malignant neoplasm of unspecified site of right female breast: Secondary | ICD-10-CM

## 2017-02-08 DIAGNOSIS — Z87891 Personal history of nicotine dependence: Secondary | ICD-10-CM | POA: Diagnosis not present

## 2017-02-08 DIAGNOSIS — M4854XA Collapsed vertebra, not elsewhere classified, thoracic region, initial encounter for fracture: Secondary | ICD-10-CM | POA: Diagnosis not present

## 2017-02-08 DIAGNOSIS — C50912 Malignant neoplasm of unspecified site of left female breast: Principal | ICD-10-CM

## 2017-02-08 DIAGNOSIS — E041 Nontoxic single thyroid nodule: Secondary | ICD-10-CM | POA: Diagnosis not present

## 2017-02-08 DIAGNOSIS — Z905 Acquired absence of kidney: Secondary | ICD-10-CM

## 2017-02-08 DIAGNOSIS — C641 Malignant neoplasm of right kidney, except renal pelvis: Secondary | ICD-10-CM

## 2017-02-08 DIAGNOSIS — N189 Chronic kidney disease, unspecified: Secondary | ICD-10-CM | POA: Diagnosis not present

## 2017-02-08 DIAGNOSIS — N183 Chronic kidney disease, stage 3 unspecified: Secondary | ICD-10-CM

## 2017-02-08 DIAGNOSIS — Z170 Estrogen receptor positive status [ER+]: Secondary | ICD-10-CM | POA: Diagnosis not present

## 2017-02-08 DIAGNOSIS — Z853 Personal history of malignant neoplasm of breast: Secondary | ICD-10-CM

## 2017-02-08 DIAGNOSIS — I4891 Unspecified atrial fibrillation: Secondary | ICD-10-CM | POA: Diagnosis not present

## 2017-02-08 DIAGNOSIS — I129 Hypertensive chronic kidney disease with stage 1 through stage 4 chronic kidney disease, or unspecified chronic kidney disease: Secondary | ICD-10-CM | POA: Insufficient documentation

## 2017-02-08 DIAGNOSIS — Z923 Personal history of irradiation: Secondary | ICD-10-CM | POA: Diagnosis not present

## 2017-02-08 DIAGNOSIS — K219 Gastro-esophageal reflux disease without esophagitis: Secondary | ICD-10-CM | POA: Diagnosis not present

## 2017-02-08 DIAGNOSIS — J90 Pleural effusion, not elsewhere classified: Secondary | ICD-10-CM | POA: Diagnosis not present

## 2017-02-08 LAB — COMPREHENSIVE METABOLIC PANEL
ALT: 15 U/L (ref 14–54)
AST: 18 U/L (ref 15–41)
Albumin: 3.7 g/dL (ref 3.5–5.0)
Alkaline Phosphatase: 52 U/L (ref 38–126)
Anion gap: 9 (ref 5–15)
BUN: 31 mg/dL — ABNORMAL HIGH (ref 6–20)
CO2: 29 mmol/L (ref 22–32)
Calcium: 9.3 mg/dL (ref 8.9–10.3)
Chloride: 101 mmol/L (ref 101–111)
Creatinine, Ser: 1.59 mg/dL — ABNORMAL HIGH (ref 0.44–1.00)
GFR calc Af Amer: 34 mL/min — ABNORMAL LOW (ref >60)
GFR calc non Af Amer: 29 mL/min — ABNORMAL LOW (ref >60)
Glucose, Bld: 118 mg/dL — ABNORMAL HIGH (ref 65–99)
Potassium: 4.6 mmol/L (ref 3.5–5.1)
Sodium: 139 mmol/L (ref 135–145)
Total Bilirubin: 0.8 mg/dL (ref 0.3–1.2)
Total Protein: 6.3 g/dL — ABNORMAL LOW (ref 6.5–8.1)

## 2017-02-08 LAB — CBC WITH DIFFERENTIAL/PLATELET
Basophils Absolute: 0.1 10*3/uL (ref 0–0.1)
Basophils Relative: 1 %
Eosinophils Absolute: 0.3 10*3/uL (ref 0–0.7)
Eosinophils Relative: 5 %
HCT: 42.8 % (ref 35.0–47.0)
Hemoglobin: 14.4 g/dL (ref 12.0–16.0)
Lymphocytes Relative: 13 %
Lymphs Abs: 0.9 10*3/uL — ABNORMAL LOW (ref 1.0–3.6)
MCH: 29.9 pg (ref 26.0–34.0)
MCHC: 33.7 g/dL (ref 32.0–36.0)
MCV: 88.7 fL (ref 80.0–100.0)
Monocytes Absolute: 0.5 10*3/uL (ref 0.2–0.9)
Monocytes Relative: 8 %
Neutro Abs: 4.9 10*3/uL (ref 1.4–6.5)
Neutrophils Relative %: 73 %
Platelets: 258 10*3/uL (ref 150–440)
RBC: 4.83 MIL/uL (ref 3.80–5.20)
RDW: 13.9 % (ref 11.5–14.5)
WBC: 6.7 10*3/uL (ref 3.6–11.0)

## 2017-02-08 NOTE — Progress Notes (Signed)
[facility] day:  02/08/2017  Chief Complaint: Tara Ramos is a 83 y.o. female with bilateral breast cancer (1990 and 1992) and bilateral renal cell carcinoma (2016) who is seen for 6 month assessment.  HPI:  The patient was last seen by me in the medical oncology clinic on 08/10/2016.  At that time, she denied any concerns. She stopped Fosamax and Femara.  She states that she is taking calcium and vitamin D.  Bilateral mammogram on 09/18/2016 revealed no evidence of malignancy.  She has a follow-up abdomen and pelvic CT scan for tomorrow to follow-up on her renal cell carcinoma (ordered by Dr. Erlene Quan).  Symptomatically, patient doing well. She denies any physical complaints. Patient has no breast concerns. She has had no B symptoms or interval infections. Patient is eating well, with no demonstrated weight loss.   Past Medical History:  Diagnosis Date  . Arthritis   . Breast cancer ([place]) 1992   right breast/radiation  . Breast cancer ([place]) 1990   left breast/radiation  . Breast cancer ([place]) 2014   right breast  . Breast cancer, right breast ([place]) 12/16/2012   left, then right, then right breast cancers (lumpectomies and radiation therapy)  . Chronic kidney disease   . Dysrhythmia    HX A FIB - FOLLOWED BY [name]  . GERD (gastroesophageal reflux disease)   . Heart murmur    hx of years ago   . Hemorrhoids   . Hypertension   . Renal cell cancer, right ([place]) 11/2014   Right Nephrectomy and Left renal ablation for lesion.   . Renal insufficiency   . Status post radiation therapy    breast cancer bilateral    Past Surgical History:  Procedure Laterality Date  . BREAST BIOPSY Right 03/10/2016   -  . BREAST EXCISIONAL BIOPSY Right 2014   +  . BREAST EXCISIONAL BIOPSY Right 1992   +  . BREAST EXCISIONAL BIOPSY Left 1990   +  . BREAST LUMPECTOMY Left 1990  . BREAST LUMPECTOMY Right 1992  . DILATION AND CURETTAGE OF  UTERUS    . EYE SURGERY Bilateral    cataract extraction  . KYPHOPLASTY N/A 10/09/2014   Procedure: KYPHOPLASTY;  Surgeon: Hessie Knows, MD;  Location: [facility] ORS;  Service: Orthopedics;  Laterality: N/A;  T7 Kyphoplasty with bone biopsy  . OTHER SURGICAL HISTORY     radiation therapy breast  . ROBOT ASSISTED LAPAROSCOPIC NEPHRECTOMY Right 02/01/2015   Procedure: ROBOTIC ASSISTED LAPAROSCOPIC RIGHT NEPHRECTOMY;  Surgeon: Cleon Gustin, MD;  Location: [facility];  Service: Urology;  Laterality: Right;    Family History  Problem Relation Age of Onset  . Stomach cancer Mother   . Stroke Father   . Brain cancer Sister   . Breast cancer Daughter 70  . Bladder Cancer Neg Hx   . Prostate cancer Neg Hx   . Kidney cancer Neg Hx     Social History:  reports that she quit smoking about 44 years ago. Her smoking use included Cigarettes. She quit after 20.00 years of use. She has never used smokeless tobacco. She reports that she drinks about 0.6 oz of alcohol per week . She reports that she does not use drugs.  She has worked for [employer].  Previously lived in [place] .  She has lived in [place] x 23 years. The patient is alone today.  Allergies:  Allergies  Allergen Reactions  . Morphine And  Related Nausea And Vomiting  . Lisinopril Cough  . Tape Other (See Comments)    Blisters underneath; wide white hypofix tape post kidney biopsy     Current Medications: Current Outpatient Prescriptions  Medication Sig Dispense Refill  . amLODipine (NORVASC) 5 MG tablet Take 7.5 mg by mouth every morning.    . Ascorbic Acid (VITAMIN C) 1000 MG tablet Take 1,000 mg by mouth daily.    . Calcium Carb-Cholecalciferol (CALCIUM + D3) 600-200 MG-UNIT TABS Take 1 tablet by mouth daily.     Marland Kitchen docusate sodium (COLACE) 100 MG capsule Take 100 mg by mouth at bedtime.    . hydrochlorothiazide (HYDRODIURIL) 25 MG tablet Take 50 mg by mouth daily.    Marland Kitchen losartan (COZAAR) 50 MG tablet Take 50 mg by mouth  every morning.    . metoprolol (LOPRESSOR) 100 MG tablet Take 100 mg by mouth 2 (two) times daily.    . Multiple Vitamin (MULTIVITAMIN WITH MINERALS) TABS tablet Take 1 tablet by mouth daily.     No current facility-administered medications for this visit.     Review of Systems:  GENERAL:  Feels "fine".  No fevers or sweats .  Weight gain of 4 pounds. PERFORMANCE STATUS (ECOG):  0 HEENT:  No visual changes, runny nose, sore throat, mouth sores or tenderness. Lungs: No shortness of breath or cough.  No hemoptysis. Cardiac:  No chest pain, palpitations, orthopnea, or PND. GI:  No nausea, vomiting, diarrhea, constipation, melena or hematochezia. GU:  No urgency, frequency, dysuria, or hematuria. Musculoskeletal:  No back pain.  No joint pain.  No muscle tenderness. Extremities:  LEFT lower extremity edema (chronic). No pain. Skin:  No rashes or skin changes. Neuro:  No headache, numbness or weakness, balance or coordination issues. Endocrine:  No diabetes, thyroid issues, hot flashes or night sweats. Psych:  No mood changes, depression or anxiety. Pain:  No focal pain. Review of systems:  All other systems reviewed and found to be negative.  Physical Exam: Blood pressure (!) 185/80, pulse (!) 58, temperature 97.6 F (36.4 C), weight 178 lb (80.7 kg). GENERAL:  Well developed, well nourished, woman sitting comfortably in the exam room in no acute distress. MENTAL STATUS:  Alert and oriented to person, place and time. HEAD:  Short gray hair.  Normocephalic, atraumatic, face symmetric, no Cushingoid features. EYES:  Glasses.  Blue eyes.  Pupils equal round and reactive to light and accomodation.  No conjunctivitis or scleral icterus. ENT:  Oropharynx clear without lesion.  Upper dentures.  Tongue normal. Mucous membranes moist.  RESPIRATORY:  Clear to auscultation without rales, wheezes or rhonchi. CARDIOVASCULAR:  Regular rate and rhythm without murmur, rub or gallop. BREAST:  Right  breast with significant scarring at 10-11 o'clock.  No skin changes or nipple discharge.  Left breast without masses, skin changes or nipple discharge.  Minimal fibrocystic changes at 9 o'clock. ABDOMEN:  Soft, non-tender, with active bowel sounds, and no hepatosplenomegaly.  No masses. SKIN:  No rashes, ulcers or lesions. EXTREMITIES: Trace LEFT lower extremity edema (stable). No skin discoloration or tenderness.  No palpable cords. LYMPH NODES: No palpable cervical, supraclavicular, axillary or inguinal adenopathy  NEUROLOGICAL: Unremarkable. PSYCH:  Appropriate.   Appointment on 02/08/2017  Component Date Value Ref Range Status  . Sodium 02/08/2017 139  135 - 145 mmol/L Final  . Potassium 02/08/2017 4.6  3.5 - 5.1 mmol/L Final  . Chloride 02/08/2017 101  101 - 111 mmol/L Final  . CO2 02/08/2017 29  22 - 32 mmol/L Final  . Glucose, Bld 02/08/2017 118* 65 - 99 mg/dL Final  . BUN 02/08/2017 31* 6 - 20 mg/dL Final  . Creatinine, Ser 02/08/2017 1.59* 0.44 - 1.00 mg/dL Final  . Calcium 02/08/2017 9.3  8.9 - 10.3 mg/dL Final  . Total Protein 02/08/2017 6.3* 6.5 - 8.1 g/dL Final  . Albumin 02/08/2017 3.7  3.5 - 5.0 g/dL Final  . AST 02/08/2017 18  15 - 41 U/L Final  . ALT 02/08/2017 15  14 - 54 U/L Final  . Alkaline Phosphatase 02/08/2017 52  38 - 126 U/L Final  . Total Bilirubin 02/08/2017 0.8  0.3 - 1.2 mg/dL Final  . GFR calc non Af Amer 02/08/2017 29* >60 mL/min Final  . GFR calc Af Amer 02/08/2017 34* >60 mL/min Final   Comment: (NOTE) The eGFR has been calculated using the CKD EPI equation. This calculation has not been validated in all clinical situations. eGFR's persistently <60 mL/min signify possible Chronic Kidney Disease.   . Anion gap 02/08/2017 9  5 - 15 Final  . WBC 02/08/2017 6.7  3.6 - 11.0 K/uL Final  . RBC 02/08/2017 4.83  3.80 - 5.20 MIL/uL Final  . Hemoglobin 02/08/2017 14.4  12.0 - 16.0 g/dL Final  . HCT 02/08/2017 42.8  35.0 - 47.0 % Final  . MCV 02/08/2017  88.7  80.0 - 100.0 fL Final  . MCH 02/08/2017 29.9  26.0 - 34.0 pg Final  . MCHC 02/08/2017 33.7  32.0 - 36.0 g/dL Final  . RDW 02/08/2017 13.9  11.5 - 14.5 % Final  . Platelets 02/08/2017 258  150 - 440 K/uL Final  . Neutrophils Relative % 02/08/2017 73  % Final  . Neutro Abs 02/08/2017 4.9  1.4 - 6.5 K/uL Final  . Lymphocytes Relative 02/08/2017 13  % Final  . Lymphs Abs 02/08/2017 0.9* 1.0 - 3.6 K/uL Final  . Monocytes Relative 02/08/2017 8  % Final  . Monocytes Absolute 02/08/2017 0.5  0.2 - 0.9 K/uL Final  . Eosinophils Relative 02/08/2017 5  % Final  . Eosinophils Absolute 02/08/2017 0.3  0 - 0.7 K/uL Final  . Basophils Relative 02/08/2017 1  % Final  . Basophils Absolute 02/08/2017 0.1  0 - 0.1 K/uL Final    Assessment:  Tara Ramos is a 83 y.o. female with a history of bilateral breast cancer ([place]) and bilateral renal cell carcinoma (2016).  She was diagnosed with left breast cancer in 1990 and is s/p lumpectomy and radiation.  She was diagnosed with right breast cancer in 1992 and is s/p lumpectomy and radiation.  She was on tamoxifen x 5 years.  She developed recurrent disease in the right breast in 2014.  She underwent lumpectomy on 12/16/2012.  Pathology revealed a 1.1 cm grade II invasive mammary carcinoma with DCIS.  Margins were negative.  Tumor was ER + (> 90%), PR+ (10%), and Her2/neu negative FISH.  Pathologic stage was T1cNxM0.  She began Femara in 12/2012.  She stopped Femara prior to her 07/2016 appointment.  Mammogram on 09/12/2015 revealed no malignancy.  Right sided mammogram and ultrasound on 02/25/2016 revealed increased density and spiculation of the right breast upper outer quadrant, middle depth, lumpectomy scar, with possible 1.4 cm mass at the superficial portion of the lumpectomy site.  Bilateral mammogram on 09/18/2016 revealed no evidence of malignancy.  Ultrasound guided biopsy of the right breast lesion at 11:00 position on 03/10/2016 was  negative for malignancy.  There  was stromal sclerosis, giant cell reaction, chronic inflammation and fat negative.     She was diagnosed with renal cell carcinoma in 2016.  CT scan revealed a 4.5 x 2.8 x 2.2 cm right medial enhancing renal mass along with a 1.6 cm enhancing inferior pole renal mass. On the left, there was a 1.9 cm LEFT enhancing renal mass.  CT guided right renal biopsy on 10/31/2014 revealed renal cell carcinoma with eosinophilic cytoplasm and rhabdoid features, Fuhrman grade 4.   No biopsy was performed on the left kidney.   She underwent right robotic nephrectomy on 02/01/2015 at [facility].  Pathology revealed a 4 cm clear cell renal cell carcinoma, Fuhrman grade 4 of the right upper pole.  There was no lymph-vascular invasion. The inferior pole lesion was not consistent with malignancy.  Pathologic stage was T1aNxM0 (stage I).  She underwent CT-guided microwave ablation of her left renal lesion on 05/24/2015.  The procedure was complicated by a left pleural effusion (resolved spontaneously).  Abdominal CT scan on 02/06/2016 revealed no evidence of recurrent disease.    Bone density study in 01/2016 revealed osteoporosis with a T score of -2.9 in the left femoral neck.  She takes calcium and vitamin D.  She stopped Fosamax.  Chest CT on 08/17/2014 revealed no evidence for lung metastases but an incidental T7 compression fracture concerning for metastatic disease.  Bone scan was concerning for pathological fracture.  She underwent a kyphoplasty with biopsy on 10/09/2014.  Pathology was negative for malignancy. She had an incidental 11 mm left thyroid nodule.  She has renal insufficiency.  Creatinine pre-op was 0.96 in 07/2014.   Creatinine was 1.40 on 07/25/2015 and 1.59 (CrCl 29 ml/min) on 02/10/2016.  Symptomatically, she denies any concerns. She has remained off Femara and Fosamax.  Exam reveals moderate scarring and fibrocystic changes in the RIGHT breast (prior breast  biopsy site).   Plan: 1.  Labs today:  CBC with diff, CMP, CA27.29. 2.  Discuss patient's decision to discontinue Fosamax and Femara.  Discuss hormonal treatment of previously diagnosed recurrent right breast cancer.  Discuss consideration of Prolia to treat osteoporosis.  Side effects reviewed. Patient continues to refuse citing that she is doing "fine" with calcium and vitamin D alone.  3.  Anticipate bilateral diagnostic mammogram with ultrasound on 09/11/2017.  Patient states that she will decide whether she wishes to pursue an ultrasound with her mammogram at next visit. 4.  RTC in 6 months for MD assessment and labs (CBC with diff, CMP, CA27.29).   Lequita Asal, MD  02/08/2017, 4:48 PM   I saw and evaluated the patient, participating in the key portions of the service and reviewing pertinent diagnostic studies and records.  I reviewed the nurse practitioner's note and agree with the findings and the plan.  The assessment and plan were discussed with the patient.  A few questions were asked by the patient and answered.   Nolon Stalls, MD 02/08/2017,4:48 PM

## 2017-02-08 NOTE — Progress Notes (Signed)
Patient is here for routine follow up today. She denies having any pain. She states that she has already had her Flu Vaccine.

## 2017-02-09 ENCOUNTER — Ambulatory Visit: Payer: PPO | Admitting: Hematology and Oncology

## 2017-02-09 ENCOUNTER — Other Ambulatory Visit: Payer: PPO

## 2017-02-09 ENCOUNTER — Ambulatory Visit
Admission: RE | Admit: 2017-02-09 | Discharge: 2017-02-09 | Disposition: A | Discharge status: Home / Self Care | Payer: PPO | Source: Ambulatory Visit | Attending: Urology | Admitting: Urology

## 2017-02-09 DIAGNOSIS — D3000 Benign neoplasm of unspecified kidney: Secondary | ICD-10-CM | POA: Diagnosis not present

## 2017-02-09 DIAGNOSIS — Z9889 Other specified postprocedural states: Secondary | ICD-10-CM | POA: Insufficient documentation

## 2017-02-09 DIAGNOSIS — K5730 Diverticulosis of large intestine without perforation or abscess without bleeding: Secondary | ICD-10-CM | POA: Diagnosis not present

## 2017-02-09 DIAGNOSIS — Z905 Acquired absence of kidney: Secondary | ICD-10-CM | POA: Insufficient documentation

## 2017-02-09 DIAGNOSIS — K769 Liver disease, unspecified: Secondary | ICD-10-CM | POA: Diagnosis not present

## 2017-02-09 DIAGNOSIS — I2510 Atherosclerotic heart disease of native coronary artery without angina pectoris: Secondary | ICD-10-CM | POA: Diagnosis not present

## 2017-02-09 DIAGNOSIS — D3500 Benign neoplasm of unspecified adrenal gland: Secondary | ICD-10-CM | POA: Diagnosis not present

## 2017-02-09 DIAGNOSIS — I700 Atherosclerosis of aorta: Secondary | ICD-10-CM | POA: Diagnosis not present

## 2017-02-09 DIAGNOSIS — D3502 Benign neoplasm of left adrenal gland: Secondary | ICD-10-CM | POA: Diagnosis not present

## 2017-02-09 MED ORDER — IOPAMIDOL (ISOVUE-370) INJECTION 76%
85.0000 mL | Freq: Once | INTRAVENOUS | Status: AC | PRN
  Administered 2017-02-09: 85 mL via INTRAVENOUS

## 2017-02-10 ENCOUNTER — Ambulatory Visit: Payer: PPO | Admitting: Urology

## 2017-02-12 ENCOUNTER — Ambulatory Visit
Admission: RE | Admit: 2017-02-12 | Discharge: 2017-02-12 | Disposition: A | Discharge status: Home / Self Care | Payer: PPO | Source: Ambulatory Visit | Attending: Urology | Admitting: Urology

## 2017-02-12 ENCOUNTER — Ambulatory Visit: Payer: PPO | Admitting: Urology

## 2017-02-12 ENCOUNTER — Encounter: Payer: Self-pay | Admitting: Urology

## 2017-02-12 VITALS — BP 180/85 | HR 53 | Ht 62.0 in | Wt 174.0 lb

## 2017-02-12 DIAGNOSIS — Z85528 Personal history of other malignant neoplasm of kidney: Secondary | ICD-10-CM | POA: Diagnosis not present

## 2017-02-12 DIAGNOSIS — IMO0002 Reserved for concepts with insufficient information to code with codable children: Secondary | ICD-10-CM

## 2017-02-12 DIAGNOSIS — N183 Chronic kidney disease, stage 3 unspecified: Secondary | ICD-10-CM

## 2017-02-12 DIAGNOSIS — J984 Other disorders of lung: Secondary | ICD-10-CM | POA: Diagnosis not present

## 2017-02-12 DIAGNOSIS — I700 Atherosclerosis of aorta: Secondary | ICD-10-CM | POA: Diagnosis not present

## 2017-02-12 DIAGNOSIS — R918 Other nonspecific abnormal finding of lung field: Secondary | ICD-10-CM | POA: Insufficient documentation

## 2017-02-12 DIAGNOSIS — Q600 Renal agenesis, unilateral: Secondary | ICD-10-CM

## 2017-02-12 NOTE — Progress Notes (Signed)
10:16 AM  02/12/17  Tara Ramos Feb 03, 1932 [id number]  Referring provider: Adin Hector, MD [facility] [place], [place] [address]  Chief Complaint  Patient presents with  . RCC    1year    HPI: 83 year old with bilateral renal masses s/p right robotic nephrectomy in [place] by Dr. Alyson Ingles in 02/01/15 and CT guided microwave ablation of the left renal mass 05/24/15.  She returns today for routine annual surveillance.  RCC history Initial imaging by MRI to confirm tthe size and location of these lesions. She was noted to have a 4.5 x 2.8 x 2.2 cm RIGHT medial  enhancing renal mass along with a 1.6 cm enhancing inferior pole renal mass. On the LEFT, she was found to have a 1.9 cm LEFT enhancing renal mass as well.    She underwent R robotic nephrectomy per Dr. Alyson Ingles on 02/01/2015.  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 The inferior pole lesion was not consistent with malignancy.  No issues postoperatively.   She then underwent successful CT-guided microwave ablation of her left renal lesion on 05/24/2015.  This was complicated by a left pleural effusion which resolved spontaneously.   She returns today after follow-up CT scan and remains NED.  CXR was ordered but not performed.  Creatinine on 07/2014 0.96 preop.   Most recent creatinine on 02/08/2017 1.59, up slightly from 1.4 a year ago.  Overall, she's doing very well. She has no complaints today. She does drink plenty of water. She avoids NSAIDs.  PMH: Past Medical History:  Diagnosis Date  . Arthritis   . Breast cancer ([place]) 1992   right breast/radiation  . Breast cancer ([place]) 1990   left breast/radiation  . Breast cancer ([place]) 2014   right breast  . Breast cancer, right breast ([place]) 12/16/2012   left, then right, then right breast cancers (lumpectomies and radiation therapy)  . Chronic kidney disease   . Dysrhythmia     HX A FIB - FOLLOWED BY [name]  . GERD (gastroesophageal reflux disease)   . Heart murmur    hx of years ago   . Hemorrhoids   . Hypertension   . Renal cell cancer, right ([place]) 11/2014   Right Nephrectomy and Left renal ablation for lesion.   . Renal insufficiency   . Status post radiation therapy    breast cancer bilateral    Surgical History: Past Surgical History:  Procedure Laterality Date  . BREAST BIOPSY Right 03/10/2016   -  . BREAST EXCISIONAL BIOPSY Right 2014   +  . BREAST EXCISIONAL BIOPSY Right 1992   +  . BREAST EXCISIONAL BIOPSY Left 1990   +  . BREAST LUMPECTOMY Left 1990  . BREAST LUMPECTOMY Right 1992  . DILATION AND CURETTAGE OF UTERUS    . EYE SURGERY Bilateral    cataract extraction  . KYPHOPLASTY N/A 10/09/2014   Procedure: KYPHOPLASTY;  Surgeon: Hessie Knows, MD;  Location: [facility] ORS;  Service: Orthopedics;  Laterality: N/A;  T7 Kyphoplasty with bone biopsy  . OTHER SURGICAL HISTORY     radiation therapy breast  . ROBOT ASSISTED LAPAROSCOPIC NEPHRECTOMY Right 02/01/2015   Procedure: ROBOTIC ASSISTED LAPAROSCOPIC RIGHT NEPHRECTOMY;  Surgeon: Cleon Gustin, MD;  Location: [facility];  Service: Urology;  Laterality: Right;    Home Medications:  Allergies as of 02/12/2017      Reactions   Morphine And  Related Nausea And Vomiting   Lisinopril Cough   Tape Other (See Comments)   Blisters underneath; wide white hypofix tape post kidney biopsy       Medication List       Accurate as of 02/12/17 10:16 AM. Always use your most recent med list.          amLODipine 5 MG tablet Commonly known as:  NORVASC Take 7.5 mg by mouth every morning.   Calcium + D3 600-200 MG-UNIT Tabs Take 1 tablet by mouth daily.   docusate sodium 100 MG capsule Commonly known as:  COLACE Take 100 mg by mouth at bedtime.   hydrochlorothiazide 25 MG tablet Commonly known as:  HYDRODIURIL Take 50 mg by mouth daily.   losartan 50 MG tablet Commonly known as:   COZAAR Take 50 mg by mouth every morning.   metoprolol tartrate 100 MG tablet Commonly known as:  LOPRESSOR Take 100 mg by mouth 2 (two) times daily.   multivitamin with minerals Tabs tablet Take 1 tablet by mouth daily.   vitamin C 1000 MG tablet Take 1,000 mg by mouth daily.       Allergies:  Allergies  Allergen Reactions  . Morphine And Related Nausea And Vomiting  . Lisinopril Cough  . Tape Other (See Comments)    Blisters underneath; wide white hypofix tape post kidney biopsy     Family History: Family History  Problem Relation Age of Onset  . Stomach cancer Mother   . Stroke Father   . Brain cancer Sister   . Breast cancer Daughter 86  . Bladder Cancer Neg Hx   . Prostate cancer Neg Hx   . Kidney cancer Neg Hx     Social History:  reports that she quit smoking about 44 years ago. Her smoking use included Cigarettes. She quit after 20.00 years of use. She has never used smokeless tobacco. She reports that she drinks about 0.6 oz of alcohol per week . She reports that she does not use drugs.   Physical Exam: BP (!) 180/85   Pulse (!) 53   Ht 5\' 2"  (1.575 m)   Wt 174 lb (78.9 kg)   BMI 31.83 kg/m   O2 sat 98% Constitutional:  Alert and oriented, No acute distress. HEENT: [place] AT, moist mucus membranes.  Trachea midline, no masses. Cardiovascular: No clubbing, cyanosis, or edema. Respiratory: Normal respiratory effort, no increased work of breathing. Skin: No rashes, bruises or suspicious lesions. Neurologic: Grossly intact, no focal deficits, moving all 4 extremities. Psychiatric: Normal mood and affect.  Laboratory Data: Lab Results  Component Value Date   WBC 6.7 02/08/2017   HGB 14.4 02/08/2017   HCT 42.8 02/08/2017   MCV 88.7 02/08/2017   PLT 258 02/08/2017    Lab Results  Component Value Date   CREATININE 1.59 (H) 02/08/2017   Imaging ADDENDUM: Upon further review there is a subtle 2.5 x 2.0 cm hypervascular lesion in segment 1 of the  liver (axial image 31 of series 6) which is not apparent on precontrast or portal venous phase images. In retrospect, this lesion has been present on numerous prior examinations (likely overlooked on prior examinations given the proximity to the IVC), and is unchanged in size. These imaging features are compatible with a benign lesion, likely focal nodular hyperplasia ([place]). Differential considerations include hepatic adenoma (less likely). Given the patient's advanced age, and a benign behavior of this lesion, no dedicated imaging follow-up is recommended at this time. Should further  characterization be desired for a specific clinical reason, MRI of the abdomen with and without IV gadolinium (preferably Eovist) could be obtained.   Electronically Signed   By: Vinnie Langton M.D.   On: 02/09/2017 10:11   Addended by Etheleen Mayhew, MD on 02/09/2017 10:14 AM    Study Result   CLINICAL DATA:  83 year old female with history of right-sided renal cell carcinoma status post right nephrectomy in August 2016. Additional history of left renal cryoablation in 2016. Currently asymptomatic. Additional history of bilateral breast cancer status post lumpectomies and radiation therapy in 1990, 1992 and again in 2014.  EXAM: CT ABDOMEN AND PELVIS WITHOUT AND WITH CONTRAST  TECHNIQUE: Multidetector CT imaging of the abdomen and pelvis was performed following the standard protocol before and following the bolus administration of intravenous contrast.  CONTRAST:  85 mL of Isovue 370.  COMPARISON:  CT the abdomen and pelvis 02/06/2016.  FINDINGS: Lower chest: Cardiomegaly. Atherosclerotic calcifications in the left main and left anterior descending coronary arteries. Atherosclerosis in the thoracic aorta.  Hepatobiliary: Again noted is a 9 mm low-attenuation lesion between segments 5 and 6 (axial image 62 series 6), which is unchanged, and although too small to  definitively characterize, is favored to represent a tiny cyst. No new suspicious appearing hepatic lesions. No intra or extrahepatic biliary ductal dilatation.  Pancreas: No suspicious pancreatic mass. No pancreatic ductal dilatation. No pancreatic or peripancreatic fluid or inflammatory changes.  Spleen: Unremarkable.  Adrenals/Urinary Tract: Status post right radical nephrectomy. No soft tissue mass in the nephrectomy bed to suggest locally recurrent disease. Post ablation changes are again noted in the medial aspect of the upper pole of the left kidney, with slight contraction and involution of the ablation defect compared to prior examinations. No definite focus of enhancing soft tissue is noted in the region of the ablation defect to strongly suggest locally recurrent disease. Several low-attenuation lesions in the left kidney are compatible with simple cysts, measuring up to 2.1 cm in the interpolar region. Other subcentimeter low-attenuation lesions in the left kidney are too small to definitively characterize, but are statistically likely to represent small cysts. 1.7 x 1.3 cm left adrenal nodule is low-attenuation (-7 HU), and similar prior studies, compatible with an adenoma. Right adrenal gland is unremarkable in appearance. No hydroureteronephrosis. Urinary bladder is normal in appearance.  Stomach/Bowel: Normal appearance of the stomach. No pathologic dilatation of small bowel or colon. Numerous colonic diverticulae are noted, without surrounding inflammatory changes to suggest an acute diverticulitis at this time. Appendix is normal in size measuring 7 mm in diameter, and demonstrates no surrounding inflammation. A small 2 mm appendicolith is noted near the tip of the appendix.  Vascular/Lymphatic: Aortic atherosclerosis, without evidence of aneurysm or dissection in the abdominal or pelvic vasculature. No lymphadenopathy noted in the abdomen or  pelvis.  Reproductive: Small calcified lesion in the posterior aspect of the uterine body, presumably a calcified fibroid. Ovaries are atrophic.  Other: No significant volume of ascites.  No pneumoperitoneum.  Musculoskeletal: There are no aggressive appearing lytic or blastic lesions noted in the visualized portions of the skeleton.  IMPRESSION: 1. Status post right radical nephrectomy and left renal cryoablation, with stable postoperative and post treatment related changes, an no findings to suggest locally recurrent or metastatic disease in the abdomen or pelvis. 2. Aortic atherosclerosis, in addition to left main and left anterior descending coronary artery disease. 3. Small left adrenal adenoma is unchanged. 4. Colonic diverticulosis without evidence of acute  diverticulitis at this time. 5. Additional incidental findings, similar prior studies, as above. Aortic Atherosclerosis (ICD10-I70.0).  Electronically Signed: By: Vinnie Langton M.D. On: 02/09/2017 10:01       CT scan personally reviewed today  Assessment & Plan:   1. Right renal mass x 2 s/p right robotic nephrectomy in 01/2015 NED  2. Left renal mass x 1 S/p left thermal ablation 04/2015 NED on CT 01/2017 Recommend continued annual imaging in 12 months  CXR today- will call if any pathology identified  3. Solitary kidney Solitary kidney precautions reviewed again today  4. Chronic renal insuffiencey, stage 3 We'll continue to monitor renal function  Plan for follow-up in 12 months likely with repeat imaging   Hollice Espy, MD  [address] [place], [place] [address] [phone]

## 2017-02-15 DIAGNOSIS — I2510 Atherosclerotic heart disease of native coronary artery without angina pectoris: Secondary | ICD-10-CM | POA: Insufficient documentation

## 2017-07-15 DIAGNOSIS — M810 Age-related osteoporosis without current pathological fracture: Secondary | ICD-10-CM | POA: Diagnosis not present

## 2017-07-15 DIAGNOSIS — N183 Chronic kidney disease, stage 3 (moderate): Secondary | ICD-10-CM | POA: Diagnosis not present

## 2017-07-15 DIAGNOSIS — E7849 Other hyperlipidemia: Secondary | ICD-10-CM | POA: Diagnosis not present

## 2017-07-15 DIAGNOSIS — K219 Gastro-esophageal reflux disease without esophagitis: Secondary | ICD-10-CM | POA: Diagnosis not present

## 2017-07-15 DIAGNOSIS — I10 Essential (primary) hypertension: Secondary | ICD-10-CM | POA: Diagnosis not present

## 2017-07-15 DIAGNOSIS — Z8781 Personal history of (healed) traumatic fracture: Secondary | ICD-10-CM | POA: Diagnosis not present

## 2017-08-05 DIAGNOSIS — N183 Chronic kidney disease, stage 3 (moderate): Secondary | ICD-10-CM | POA: Diagnosis not present

## 2017-08-05 DIAGNOSIS — I2510 Atherosclerotic heart disease of native coronary artery without angina pectoris: Secondary | ICD-10-CM | POA: Diagnosis not present

## 2017-08-05 DIAGNOSIS — M810 Age-related osteoporosis without current pathological fracture: Secondary | ICD-10-CM | POA: Diagnosis not present

## 2017-08-05 DIAGNOSIS — K219 Gastro-esophageal reflux disease without esophagitis: Secondary | ICD-10-CM | POA: Diagnosis not present

## 2017-08-05 DIAGNOSIS — I700 Atherosclerosis of aorta: Secondary | ICD-10-CM | POA: Diagnosis not present

## 2017-08-05 DIAGNOSIS — I10 Essential (primary) hypertension: Secondary | ICD-10-CM | POA: Diagnosis not present

## 2017-08-05 DIAGNOSIS — C642 Malignant neoplasm of left kidney, except renal pelvis: Secondary | ICD-10-CM | POA: Diagnosis not present

## 2017-08-05 DIAGNOSIS — C50911 Malignant neoplasm of unspecified site of right female breast: Secondary | ICD-10-CM | POA: Diagnosis not present

## 2017-08-05 DIAGNOSIS — C50912 Malignant neoplasm of unspecified site of left female breast: Secondary | ICD-10-CM | POA: Diagnosis not present

## 2017-08-05 DIAGNOSIS — E7849 Other hyperlipidemia: Secondary | ICD-10-CM | POA: Diagnosis not present

## 2017-08-08 NOTE — Progress Notes (Signed)
[facility] day:  08/09/2017  Chief Complaint: Tara Ramos is a 84 y.o. female with bilateral breast cancer (1990 and 1992) and bilateral renal cell carcinoma (2016) who is seen for 6 month assessment.  HPI:  The patient was last seen in the medical oncology clinic on 02/08/2017.  At that time, patient was doing well. She denied any concerns. Patient had no B symptoms. She had discontinued Femara and Fosamax.  We discussed Prolia, however she refused citing that she was "just fine" on calcium and vitamin D alone. Exam revealed moderate scarring and fibrocystic changes in the RIGHT breast. Patient scheduled for repeat imaging and follow up with urology (Dr Erlene Quan) for her renal cell carcinoma.   Abdomen and pelvis CT on 02/09/2017 demonstrated a subtle 2.5 x 2.0 cm hypervascular lesion in segment 1 of the liver. I maging features were compatible with a benign lesion, likely focal nodular hyperplasia ([place]). Several low-attenuation lesions in the LEFT kidney are compatible with simple cysts, measuring up to 2.1 cm in the interpolar region. There was a stable 1.7 x 1.3 cm left adrenal adenoma.  There were no findings to suggest locally recurrent or metastatic disease in the abdomen or pelvis.  She was seen in follow up consult on 02/12/2018 by Dr. Hollice Espy (urology). CT scan reviewed as negative for recurrent disease. Creatinine had trended up to 1.59 (pre-op 0.96). Patient advised to increase fluid intake and continue to avoid NSAID medications. Patient to follow up in 1 year with repeat imaging.   Symptomatically, patient has been doing well. Her blood pressure is elevated at 210/82. She notes that her blood pressure was fine at home this morning, with her SBP being in the 130s. Patient states, "it is always up when I come to the doctor". Patient has no acute concerns. Patient has no breast concerns. She has no B symptoms or interval infections.    Patient is eating well. Her weight is up 5 pounds. Patient denies pain in the clinic today.   Patient performs monthly self breast examinations as recommended.    Past Medical History:  Diagnosis Date  . Arthritis   . Breast cancer ([place]) 1992   right breast/radiation  . Breast cancer ([place]) 1990   left breast/radiation  . Breast cancer ([place]) 2014   right breast  . Breast cancer, right breast ([place]) 12/16/2012   left, then right, then right breast cancers (lumpectomies and radiation therapy)  . Chronic kidney disease   . Dysrhythmia    HX A FIB - FOLLOWED BY [name]  . GERD (gastroesophageal reflux disease)   . Heart murmur    hx of years ago   . Hemorrhoids   . Hypertension   . Renal cell cancer, right ([place]) 11/2014   Right Nephrectomy and Left renal ablation for lesion.   . Renal insufficiency   . Status post radiation therapy    breast cancer bilateral    Past Surgical History:  Procedure Laterality Date  . BREAST BIOPSY Right 03/10/2016   -  . BREAST EXCISIONAL BIOPSY Right 2014   +  . BREAST EXCISIONAL BIOPSY Right 1992   +  . BREAST EXCISIONAL BIOPSY Left 1990   +  . BREAST LUMPECTOMY Left 1990  . BREAST LUMPECTOMY Right 1992  . DILATION AND CURETTAGE OF UTERUS    . EYE SURGERY Bilateral    cataract extraction  . KYPHOPLASTY N/A 10/09/2014   Procedure: KYPHOPLASTY;  Surgeon: Legrand Como  Rudene Christians, MD;  Location: [facility] ORS;  Service: Orthopedics;  Laterality: N/A;  T7 Kyphoplasty with bone biopsy  . OTHER SURGICAL HISTORY     radiation therapy breast  . ROBOT ASSISTED LAPAROSCOPIC NEPHRECTOMY Right 02/01/2015   Procedure: ROBOTIC ASSISTED LAPAROSCOPIC RIGHT NEPHRECTOMY;  Surgeon: Cleon Gustin, MD;  Location: [facility] ORS;  Service: Urology;  Laterality: Right;    Family History  Problem Relation Age of Onset  . Stomach cancer Mother   . Stroke Father   . Brain cancer Sister   . Breast cancer Daughter 78  . Bladder Cancer Neg Hx   . Prostate cancer Neg Hx   .  Kidney cancer Neg Hx     Social History:  reports that she quit smoking about 45 years ago. Her smoking use included cigarettes. She quit after 20.00 years of use. She has never used smokeless tobacco. She reports that she drinks about 0.6 oz of alcohol per week. She reports that she does not use drugs.  She has worked for [employer].  Previously lived in [place] .  She has lived in [place] x 23 years. The patient is alone today.  Allergies:  Allergies  Allergen Reactions  . Morphine And Related Nausea And Vomiting  . Lisinopril Cough  . Tape Other (See Comments)    Blisters underneath; wide white hypofix tape post kidney biopsy     Current Medications: Current Outpatient Medications  Medication Sig Dispense Refill  . amLODipine (NORVASC) 5 MG tablet Take 7.5 mg by mouth every morning.    . Ascorbic Acid (VITAMIN C) 1000 MG tablet Take 1,000 mg by mouth daily.    . Calcium Carb-Cholecalciferol (CALCIUM + D3) 600-200 MG-UNIT TABS Take 1 tablet by mouth daily.     Marland Kitchen docusate sodium (COLACE) 100 MG capsule Take 100 mg by mouth at bedtime.    . hydrochlorothiazide (HYDRODIURIL) 25 MG tablet Take 50 mg by mouth daily.    Marland Kitchen losartan (COZAAR) 100 MG tablet Take 100 mg by mouth daily.    . metoprolol (LOPRESSOR) 100 MG tablet Take 100 mg by mouth 2 (two) times daily.    . Multiple Vitamin (MULTIVITAMIN WITH MINERALS) TABS tablet Take 1 tablet by mouth daily.     No current facility-administered medications for this visit.     Review of Systems:  GENERAL:  Feels good.  No fevers, sweats or weight loss.  Weight up 5 pounds. PERFORMANCE STATUS (ECOG):  0 HEENT:  No visual changes, runny nose, sore throat, mouth sores or tenderness. Lungs: No shortness of breath or cough.  No hemoptysis. Cardiac:  No chest pain, palpitations, orthopnea, or PND.  White coat hypertension. GI:  No nausea, vomiting, diarrhea, constipation, melena or hematochezia. GU:  No urgency, frequency, dysuria,  or hematuria. Musculoskeletal:  No back pain.  No joint pain.  No muscle tenderness. Extremities:  Chronic left lower extremity edema.  No pain. Skin:  No rashes or skin changes. Neuro:  No headache, numbness or weakness, balance or coordination issues. Endocrine:  No diabetes, thyroid issues, hot flashes or night sweats. Psych:  No mood changes, depression or anxiety. Pain:  No focal pain. Review of systems:  All other systems reviewed and found to be negative.   Physical Exam: Blood pressure (!) 210/82, pulse (!) 46, temperature (!) 97.4 F (36.3 C), temperature source Tympanic, resp. rate 18, weight 179 lb 7 oz (81.4 kg). GENERAL:  Well developed, well nourished, woman sitting comfortably in the exam room in  no acute distress. MENTAL STATUS:  Alert and oriented to person, place and time. HEAD:  Short white hair.  Normocephalic, atraumatic, face symmetric, no Cushingoid features. EYES:  Glasses.  Blue eyes.  Pupils equal round and reactive to light and accomodation.  No conjunctivitis or scleral icterus. ENT:  Oropharynx clear without lesion.  Tongue normal. Mucous membranes moist.  RESPIRATORY:  Clear to auscultation without rales, wheezes or rhonchi. CARDIOVASCULAR:  Regular rate and rhythm without murmur, rub or gallop. BREAST:  Right breast with significant scarring (2-3 cm) at 9 o'clock  No skin changes or nipple discharge.  Left breast without masses, skin changes or nipple discharge.  Minimal fibrocystic changes. ABDOMEN:  Soft, non-tender, with active bowel sounds, and no hepatosplenomegaly.  No masses. SKIN:  No rashes, ulcers or lesions. EXTREMITIES: No edema, no skin discoloration or tenderness.  No palpable cords. LYMPH NODES: No palpable cervical, supraclavicular, axillary or inguinal adenopathy  NEUROLOGICAL: Unremarkable. PSYCH:  Appropriate.    Appointment on 08/09/2017  Component Date Value Ref Range Status  . Sodium 08/09/2017 140  135 - 145 mmol/L Final  .  Potassium 08/09/2017 4.6  3.5 - 5.1 mmol/L Final  . Chloride 08/09/2017 105  101 - 111 mmol/L Final  . CO2 08/09/2017 29  22 - 32 mmol/L Final  . Glucose, Bld 08/09/2017 100* 65 - 99 mg/dL Final  . BUN 08/09/2017 30* 6 - 20 mg/dL Final  . Creatinine, Ser 08/09/2017 1.28* 0.44 - 1.00 mg/dL Final  . Calcium 08/09/2017 9.6  8.9 - 10.3 mg/dL Final  . Total Protein 08/09/2017 6.4* 6.5 - 8.1 g/dL Final  . Albumin 08/09/2017 3.7  3.5 - 5.0 g/dL Final  . AST 08/09/2017 18  15 - 41 U/L Final  . ALT 08/09/2017 17  14 - 54 U/L Final  . Alkaline Phosphatase 08/09/2017 51  38 - 126 U/L Final  . Total Bilirubin 08/09/2017 0.9  0.3 - 1.2 mg/dL Final  . GFR calc non Af Amer 08/09/2017 38* >60 mL/min Final  . GFR calc Af Amer 08/09/2017 44* >60 mL/min Final   Comment: (NOTE) The eGFR has been calculated using the CKD EPI equation. This calculation has not been validated in all clinical situations. eGFR's persistently <60 mL/min signify possible Chronic Kidney Disease.   Georgiann Hahn gap 08/09/2017 6  5 - 15 Final   Performed at [facility], [address] [place], [place] [address]  . WBC 08/09/2017 7.6  3.6 - 11.0 K/uL Final  . RBC 08/09/2017 4.90  3.80 - 5.20 MIL/uL Final  . Hemoglobin 08/09/2017 14.9  12.0 - 16.0 g/dL Final  . HCT 08/09/2017 43.6  35.0 - 47.0 % Final  . MCV 08/09/2017 88.9  80.0 - 100.0 fL Final  . MCH 08/09/2017 30.3  26.0 - 34.0 pg Final  . MCHC 08/09/2017 34.1  32.0 - 36.0 g/dL Final  . RDW 08/09/2017 13.7  11.5 - 14.5 % Final  . Platelets 08/09/2017 257  150 - 440 K/uL Final  . Neutrophils Relative % 08/09/2017 76  % Final  . Neutro Abs 08/09/2017 5.7  1.4 - 6.5 K/uL Final  . Lymphocytes Relative 08/09/2017 14  % Final  . Lymphs Abs 08/09/2017 1.1  1.0 - 3.6 K/uL Final  . Monocytes Relative 08/09/2017 6  % Final  . Monocytes Absolute 08/09/2017 0.5  0.2 - 0.9 K/uL Final  . Eosinophils Relative 08/09/2017 3  % Final  . Eosinophils Absolute 08/09/2017 0.2  0 - 0.7 K/uL  Final  . Basophils Relative 08/09/2017 1  % Final  . Basophils Absolute 08/09/2017 0.1  0 - 0.1 K/uL Final   Performed at [facility], [address] [place], [place] [address]    Assessment:  LESTA LIMBERT is a 84 y.o. female with a history of bilateral breast cancer (1990 and 1992) and bilateral renal cell carcinoma (2016).  She was diagnosed with left breast cancer in 1990 and is s/p lumpectomy and radiation.  She was diagnosed with right breast cancer in 1992 and is s/p lumpectomy and radiation.  She was on tamoxifen x 5 years.  She developed recurrent disease in the right breast in 2014.  She underwent lumpectomy on 12/16/2012.  Pathology revealed a 1.1 cm grade II invasive mammary carcinoma with DCIS.  Margins were negative.  Tumor was ER + (> 90%), PR+ (10%), and Her2/neu negative FISH.  Pathologic stage was T1cNxM0.  She began Femara in 12/2012.  She stopped Femara prior to her 07/2016 appointment.  Mammogram on 09/12/2015 revealed no malignancy.  Right sided mammogram and ultrasound on 02/25/2016 revealed increased density and spiculation of the right breast upper outer quadrant, middle depth, lumpectomy scar, with possible 1.4 cm mass at the superficial portion of the lumpectomy site.  Bilateral mammogram on 09/18/2016 revealed no evidence of malignancy.  Ultrasound guided biopsy of the right breast lesion at 11:00 position on 03/10/2016 was negative for malignancy.  There was stromal sclerosis, giant cell reaction, chronic inflammation and fat negative.     She was diagnosed with renal cell carcinoma in 2016.  CT scan revealed a 4.5 x 2.8 x 2.2 cm right medial enhancing renal mass along with a 1.6 cm enhancing inferior pole renal mass. On the left, there was a 1.9 cm LEFT enhancing renal mass.  CT guided right renal biopsy on 10/31/2014 revealed renal cell carcinoma with eosinophilic cytoplasm and rhabdoid features, Fuhrman grade 4.   No biopsy was performed on the left kidney.    She underwent right robotic nephrectomy on 02/01/2015 [facility].  Pathology revealed a 4 cm clear cell renal cell carcinoma, Fuhrman grade 4 of the right upper pole.  There was no lymph-vascular invasion. The inferior pole lesion was not consistent with malignancy.  Pathologic stage was T1aNxM0 (stage I).  She underwent CT-guided microwave ablation of her left renal lesion on 05/24/2015.  The procedure was complicated by a left pleural effusion (resolved spontaneously).  Abdomen CT scan on 02/06/2016 revealed no evidence of recurrent disease.   Abdomen and pelvis CT  on 02/09/2017 demonstrated a subtle 2.5 x 2.0 cm hypervascular lesion in segment 1 of the liver. Imaging features are compatible with a benign lesion, likely focal nodular hyperplasia ([place]). Several low-attenuation lesions in the LEFT kidney are compatible with simple cysts, measuring up to 2.1 cm in the interpolar region. Stable 1.7 x 1.3 cm left adrenal adenoma also mentioned. There were no findings to suggest locally recurrent or metastatic disease in the abdomen or pelvis. Abdomen and pelvis CT on 02/09/2017 demonstrated a subtle 2.5 x 2.0 cm hypervascular lesion in segment 1 of the liver. Imaging features were compatible with a benign lesion, likely focal nodular hyperplasia ([place]). Several low-attenuation lesions in the LEFT kidney are compatible with simple cysts, measuring up to 2.1 cm in the interpolar region. There was a stable 1.7 x 1.3 cm left adrenal adenoma.  There were no findings to suggest locally recurrent or metastatic disease in the abdomen or pelvis.  Bone density  study in 01/2016 revealed osteoporosis with a T score of -2.9 in the left femoral neck.  She takes calcium and vitamin D.  She stopped Fosamax.  Chest CT on 08/17/2014 revealed no evidence for lung metastases but an incidental T7 compression fracture concerning for metastatic disease.  Bone scan was concerning for pathological fracture.  She underwent a  kyphoplasty with biopsy on 10/09/2014.  Pathology was negative for malignancy. She had an incidental 11 mm left thyroid nodule.  She has renal insufficiency.  Creatinine pre-op was 0.96 in 07/2014.   Creatinine was 1.40 on 07/25/2015, 1.59 (CrCl 29 ml/min) on 02/10/2016, and 1.59 02/08/2017  Symptomatically, she denies any concerns. She has remained off Femara and Fosamax.  Exam reveals moderate scarring and fibrocystic changes in the RIGHT breast (prior breast biopsy site).  Renal function has slightly improved; BUN 30 with a creatinine of 1.28 (CrCl 38 mL/min).   Plan: 1.  Labs today:  CBC with diff, CMP, CA27.29. 2.  Review CT scan from 02/09/2017 - negative for recurrent disease.  3.  Schedule bilateral diagnostic mammogram on 09/20/2017.  4.  Discuss need for RIGHT breast ultrasound, however patient declines and opts to wait until radiologist sees repeat mammogram.  5.  Discuss bone density study on 01/25/2018. Patient agrees to study.  6.  RTC in 6 months for MD assessment and labs (CBC with diff, CMP, CA27.29).   Honor Loh, NP  08/09/2017, 12:20 PM   I saw and evaluated the patient, participating in the key portions of the service and reviewing pertinent diagnostic studies and records.  I reviewed the nurse practitioner's note and agree with the findings and the plan.  The assessment and plan were discussed with the patient.  A few questions were asked by the patient and answered.   Nolon Stalls, MD 08/09/2017,12:20 PM

## 2017-08-09 ENCOUNTER — Inpatient Hospital Stay: Payer: PPO | Attending: Hematology and Oncology | Admitting: Hematology and Oncology

## 2017-08-09 ENCOUNTER — Inpatient Hospital Stay: Payer: PPO

## 2017-08-09 ENCOUNTER — Telehealth: Payer: Self-pay | Admitting: Hematology and Oncology

## 2017-08-09 ENCOUNTER — Encounter: Payer: Self-pay | Admitting: Hematology and Oncology

## 2017-08-09 VITALS — BP 210/82 | HR 46 | Temp 97.4°F | Resp 18 | Wt 179.4 lb

## 2017-08-09 DIAGNOSIS — C641 Malignant neoplasm of right kidney, except renal pelvis: Secondary | ICD-10-CM

## 2017-08-09 DIAGNOSIS — M816 Localized osteoporosis [Lequesne]: Secondary | ICD-10-CM

## 2017-08-09 DIAGNOSIS — C50911 Malignant neoplasm of unspecified site of right female breast: Secondary | ICD-10-CM

## 2017-08-09 DIAGNOSIS — Z853 Personal history of malignant neoplasm of breast: Secondary | ICD-10-CM

## 2017-08-09 DIAGNOSIS — C50912 Malignant neoplasm of unspecified site of left female breast: Principal | ICD-10-CM

## 2017-08-09 DIAGNOSIS — M810 Age-related osteoporosis without current pathological fracture: Secondary | ICD-10-CM

## 2017-08-09 DIAGNOSIS — Z7189 Other specified counseling: Secondary | ICD-10-CM

## 2017-08-09 DIAGNOSIS — Z85528 Personal history of other malignant neoplasm of kidney: Secondary | ICD-10-CM | POA: Diagnosis not present

## 2017-08-09 LAB — CBC WITH DIFFERENTIAL/PLATELET
Basophils Absolute: 0.1 10*3/uL (ref 0–0.1)
Basophils Relative: 1 %
Eosinophils Absolute: 0.2 10*3/uL (ref 0–0.7)
Eosinophils Relative: 3 %
HCT: 43.6 % (ref 35.0–47.0)
Hemoglobin: 14.9 g/dL (ref 12.0–16.0)
Lymphocytes Relative: 14 %
Lymphs Abs: 1.1 10*3/uL (ref 1.0–3.6)
MCH: 30.3 pg (ref 26.0–34.0)
MCHC: 34.1 g/dL (ref 32.0–36.0)
MCV: 88.9 fL (ref 80.0–100.0)
Monocytes Absolute: 0.5 10*3/uL (ref 0.2–0.9)
Monocytes Relative: 6 %
Neutro Abs: 5.7 10*3/uL (ref 1.4–6.5)
Neutrophils Relative %: 76 %
Platelets: 257 10*3/uL (ref 150–440)
RBC: 4.9 MIL/uL (ref 3.80–5.20)
RDW: 13.7 % (ref 11.5–14.5)
WBC: 7.6 10*3/uL (ref 3.6–11.0)

## 2017-08-09 LAB — COMPREHENSIVE METABOLIC PANEL
ALT: 17 U/L (ref 14–54)
AST: 18 U/L (ref 15–41)
Albumin: 3.7 g/dL (ref 3.5–5.0)
Alkaline Phosphatase: 51 U/L (ref 38–126)
Anion gap: 6 (ref 5–15)
BUN: 30 mg/dL — ABNORMAL HIGH (ref 6–20)
CO2: 29 mmol/L (ref 22–32)
Calcium: 9.6 mg/dL (ref 8.9–10.3)
Chloride: 105 mmol/L (ref 101–111)
Creatinine, Ser: 1.28 mg/dL — ABNORMAL HIGH (ref 0.44–1.00)
GFR calc Af Amer: 44 mL/min — ABNORMAL LOW (ref >60)
GFR calc non Af Amer: 38 mL/min — ABNORMAL LOW (ref >60)
Glucose, Bld: 100 mg/dL — ABNORMAL HIGH (ref 65–99)
Potassium: 4.6 mmol/L (ref 3.5–5.1)
Sodium: 140 mmol/L (ref 135–145)
Total Bilirubin: 0.9 mg/dL (ref 0.3–1.2)
Total Protein: 6.4 g/dL — ABNORMAL LOW (ref 6.5–8.1)

## 2017-08-09 NOTE — Telephone Encounter (Signed)
Dexa/Mammo and Labs/MD assessment scheduled per 08/09/17 los. Appts mailed to patient.

## 2017-08-09 NOTE — Progress Notes (Signed)
Patient offers no complaints today.  BP is elevated sytolic - 833/58, HR 96.  Patient states BP is always high when she is here.  She took it this morning at home and it was fine.

## 2017-08-10 LAB — CANCER ANTIGEN 27.29: CA 27.29: 20.3 U/mL (ref 0.0–38.6)

## 2017-09-23 ENCOUNTER — Ambulatory Visit
Admission: RE | Admit: 2017-09-23 | Discharge: 2017-09-23 | Disposition: A | Discharge status: Home / Self Care | Payer: PPO | Source: Ambulatory Visit | Attending: Urgent Care | Admitting: Urgent Care

## 2017-09-23 DIAGNOSIS — C50912 Malignant neoplasm of unspecified site of left female breast: Secondary | ICD-10-CM | POA: Insufficient documentation

## 2017-09-23 DIAGNOSIS — C50911 Malignant neoplasm of unspecified site of right female breast: Secondary | ICD-10-CM | POA: Diagnosis not present

## 2017-09-23 DIAGNOSIS — R928 Other abnormal and inconclusive findings on diagnostic imaging of breast: Secondary | ICD-10-CM | POA: Diagnosis not present

## 2017-09-23 HISTORY — DX: Personal history of irradiation: Z92.3

## 2017-10-29 DIAGNOSIS — I10 Essential (primary) hypertension: Secondary | ICD-10-CM | POA: Diagnosis not present

## 2017-11-04 DIAGNOSIS — N183 Chronic kidney disease, stage 3 (moderate): Secondary | ICD-10-CM | POA: Diagnosis not present

## 2017-11-04 DIAGNOSIS — C50912 Malignant neoplasm of unspecified site of left female breast: Secondary | ICD-10-CM | POA: Diagnosis not present

## 2017-11-04 DIAGNOSIS — C642 Malignant neoplasm of left kidney, except renal pelvis: Secondary | ICD-10-CM | POA: Diagnosis not present

## 2017-11-04 DIAGNOSIS — E7849 Other hyperlipidemia: Secondary | ICD-10-CM | POA: Diagnosis not present

## 2017-11-04 DIAGNOSIS — K219 Gastro-esophageal reflux disease without esophagitis: Secondary | ICD-10-CM | POA: Diagnosis not present

## 2017-11-04 DIAGNOSIS — I10 Essential (primary) hypertension: Secondary | ICD-10-CM | POA: Diagnosis not present

## 2017-11-04 DIAGNOSIS — C50911 Malignant neoplasm of unspecified site of right female breast: Secondary | ICD-10-CM | POA: Diagnosis not present

## 2017-11-04 DIAGNOSIS — M810 Age-related osteoporosis without current pathological fracture: Secondary | ICD-10-CM | POA: Diagnosis not present

## 2017-11-04 DIAGNOSIS — Z8781 Personal history of (healed) traumatic fracture: Secondary | ICD-10-CM | POA: Diagnosis not present

## 2017-11-04 DIAGNOSIS — I2510 Atherosclerotic heart disease of native coronary artery without angina pectoris: Secondary | ICD-10-CM | POA: Diagnosis not present

## 2017-11-04 DIAGNOSIS — I700 Atherosclerosis of aorta: Secondary | ICD-10-CM | POA: Diagnosis not present

## 2017-12-04 IMAGING — US US BREAST*R* LIMITED INC AXILLA
1 series · 10 of 10 positions shown · non-contrast
Comparison: Previous exam(s).

CLINICAL DATA: History of treated right breast cancer twice, with
the second right breast lumpectomy being in 4215. Clinically
detected palpable abnormality in the right breast upper outer
quadrant.

EXAM:
2D DIGITAL DIAGNOSTIC RIGHT MAMMOGRAM WITH CAD AND ADJUNCT TOMO
ULTRASOUND RIGHT BREAST

[Series 1: us breast*right* limited inc axilla · 0.08mm/px · 10 of 10 slices shown]
[im 1/10]
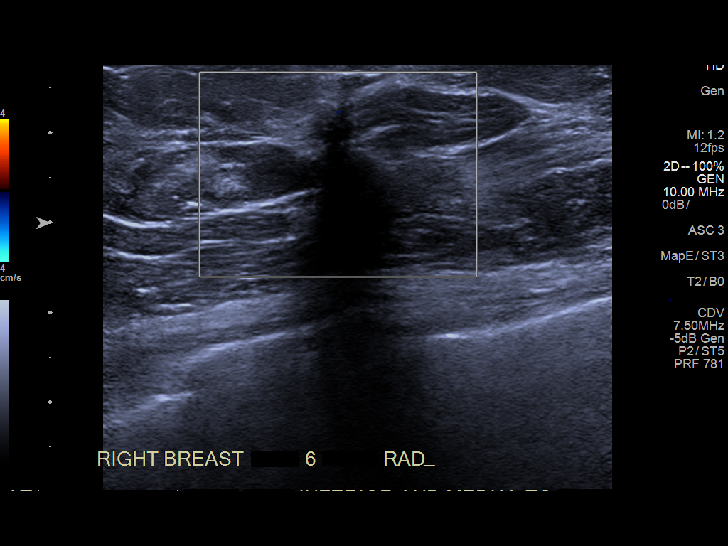
[im 2/10]
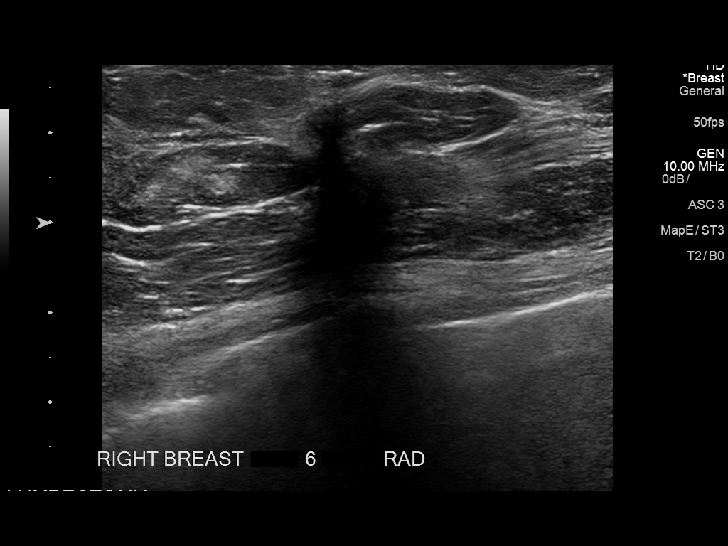
[im 3/10]
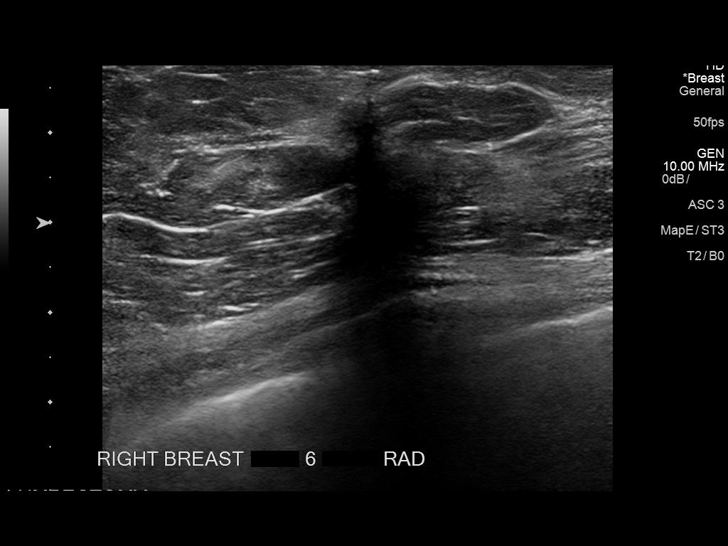
[im 4/10]
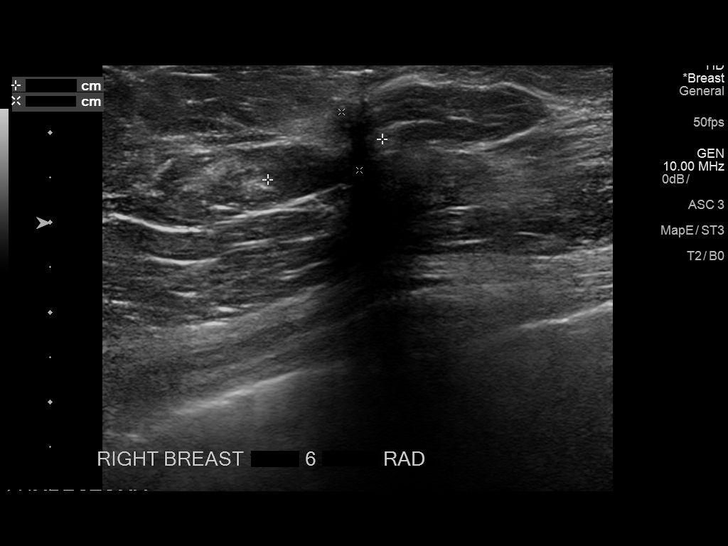
[im 5/10]
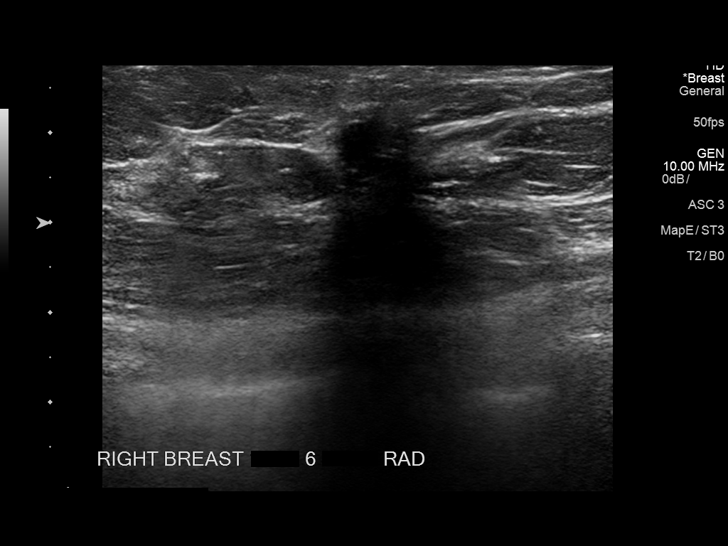
[im 6/10]
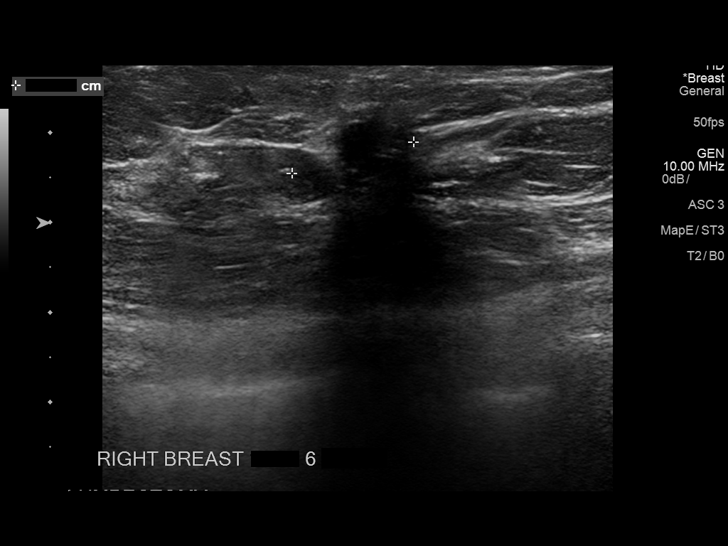
[im 7/10]
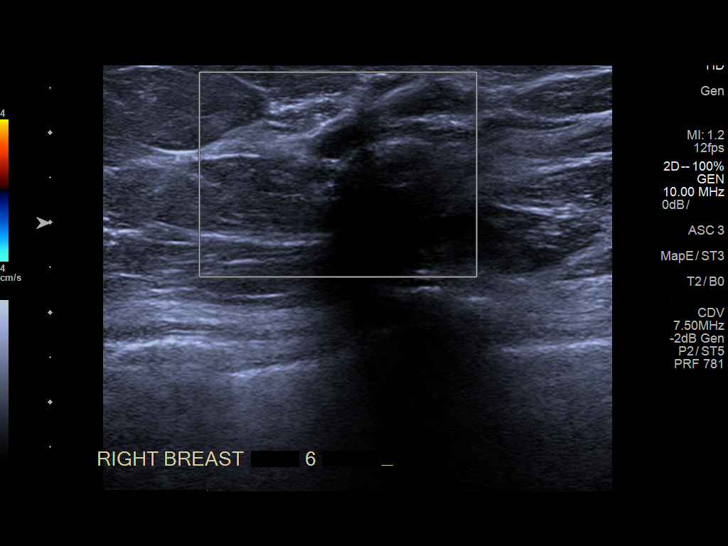
[im 8/10]
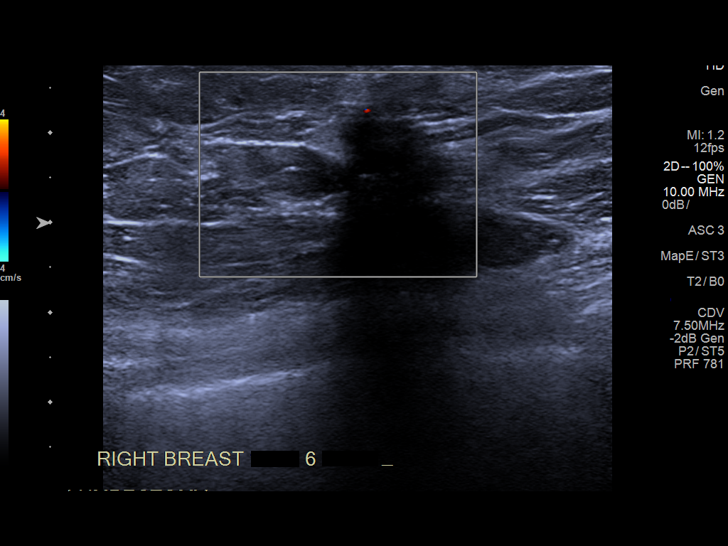
[im 9/10]
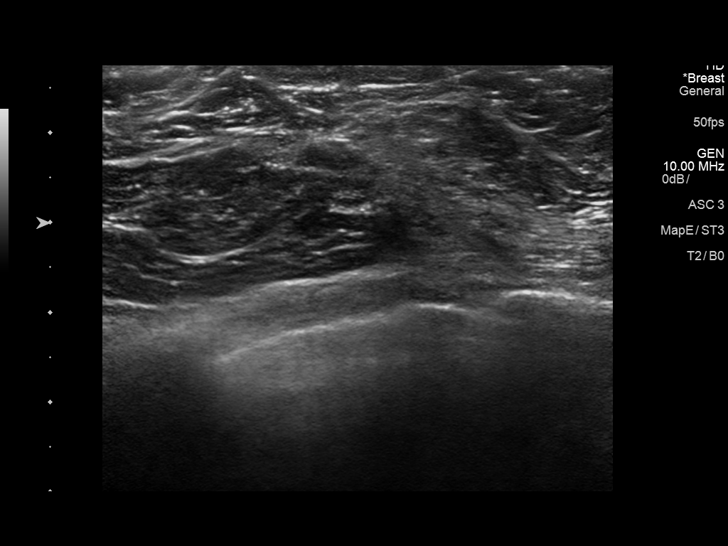
[im 10/10]
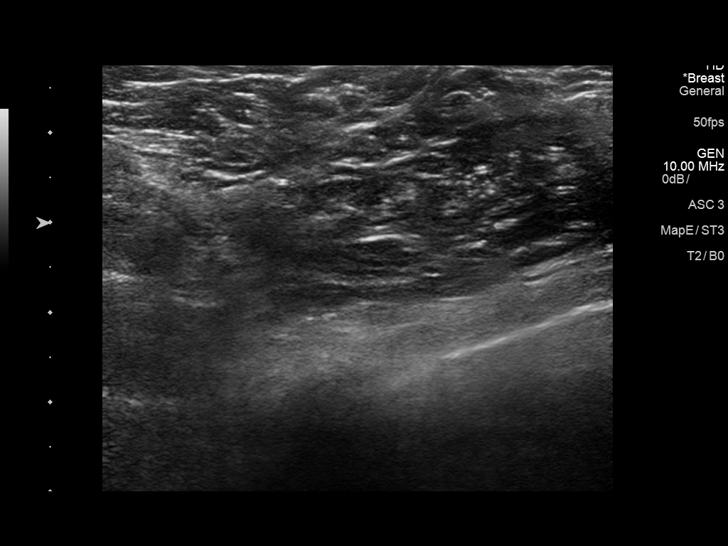

[10 of 10 positions shown; findings below may reference images not displayed]

ACR Breast Density Category b: There are scattered areas of
fibroglandular density.
FINDINGS: Mammographically, there is an increasing density and spiculation of
the right breast upper outer quadrant, middle depth, lumpectomy
site. This may represent maturing post lumpectomy changes or local
recurrence. Otherwise, there is stable appearance of the lumpectomy
site in the more superior right breast, posterior depth. No new
masses are seen.

Mammographic images were processed with CAD.

On physical exam, there is thickening in the right breast upper
outer quadrant, middle depth.

Targeted ultrasound is performed, showing right breast 11 o'clock 6
cm from the nipple, at the site of prior lumpectomy, questionable
hypoechoic macro lobulated mass which measures 1.4 x 1.4 x 0.7 cm.
It is difficult to determine whether this represents part of the
lumpectomy scar or a local recurrence.
IMPRESSION: Increased density and spiculation of the right breast upper outer
quadrant, middle depth, lumpectomy scar, with possible 1.4 cm mass
at the superficial portion of the lumpectomy site. It is difficult
to determine whether this represent maturing lumpectomy scar/fat
necrosis or a local recurrence.

RECOMMENDATION:
Ultrasound-guided core needle biopsy of the perceived 1.4 cm mass at
the right breast 11 o'clock lumpectomy site.

I have discussed the findings and recommendations with the patient.
Results were also provided in writing at the conclusion of the
visit. If applicable, a reminder letter will be sent to the patient
regarding the next appointment.

BI-RADS CATEGORY  4: Suspicious.

## 2018-02-04 ENCOUNTER — Ambulatory Visit
Admission: RE | Admit: 2018-02-04 | Discharge: 2018-02-04 | Disposition: A | Discharge status: Home / Self Care | Payer: PPO | Source: Ambulatory Visit | Attending: Urology | Admitting: Urology

## 2018-02-04 ENCOUNTER — Ambulatory Visit: Admission: RE | Admit: 2018-02-04 | Payer: PPO | Source: Ambulatory Visit

## 2018-02-04 ENCOUNTER — Other Ambulatory Visit: Payer: Self-pay

## 2018-02-04 DIAGNOSIS — N281 Cyst of kidney, acquired: Secondary | ICD-10-CM | POA: Insufficient documentation

## 2018-02-04 DIAGNOSIS — K769 Liver disease, unspecified: Secondary | ICD-10-CM | POA: Diagnosis not present

## 2018-02-04 DIAGNOSIS — Z85528 Personal history of other malignant neoplasm of kidney: Secondary | ICD-10-CM | POA: Diagnosis not present

## 2018-02-04 DIAGNOSIS — Z905 Acquired absence of kidney: Secondary | ICD-10-CM | POA: Insufficient documentation

## 2018-02-04 DIAGNOSIS — C649 Malignant neoplasm of unspecified kidney, except renal pelvis: Secondary | ICD-10-CM | POA: Diagnosis not present

## 2018-02-04 LAB — POCT I-STAT CREATININE: CREATININE: 1.4 mg/dL — AB (ref 0.44–1.00)

## 2018-02-04 MED ORDER — IOPAMIDOL (ISOVUE-300) INJECTION 61%
60.0000 mL | Freq: Once | INTRAVENOUS | Status: AC | PRN
  Administered 2018-02-04: 60 mL via INTRAVENOUS

## 2018-02-08 ENCOUNTER — Other Ambulatory Visit: Payer: PPO

## 2018-02-08 ENCOUNTER — Ambulatory Visit: Payer: PPO | Admitting: Hematology and Oncology

## 2018-02-10 ENCOUNTER — Encounter: Payer: Self-pay | Admitting: Hematology and Oncology

## 2018-02-10 ENCOUNTER — Inpatient Hospital Stay (HOSPITAL_BASED_OUTPATIENT_CLINIC_OR_DEPARTMENT_OTHER): Payer: PPO | Admitting: Hematology and Oncology

## 2018-02-10 ENCOUNTER — Inpatient Hospital Stay: Payer: PPO | Attending: Hematology and Oncology

## 2018-02-10 VITALS — BP 185/82 | HR 52 | Temp 98.2°F | Resp 18 | Wt 176.4 lb

## 2018-02-10 DIAGNOSIS — Z853 Personal history of malignant neoplasm of breast: Secondary | ICD-10-CM | POA: Insufficient documentation

## 2018-02-10 DIAGNOSIS — Z85528 Personal history of other malignant neoplasm of kidney: Secondary | ICD-10-CM

## 2018-02-10 DIAGNOSIS — M810 Age-related osteoporosis without current pathological fracture: Secondary | ICD-10-CM | POA: Insufficient documentation

## 2018-02-10 DIAGNOSIS — C641 Malignant neoplasm of right kidney, except renal pelvis: Secondary | ICD-10-CM

## 2018-02-10 DIAGNOSIS — C50911 Malignant neoplasm of unspecified site of right female breast: Secondary | ICD-10-CM

## 2018-02-10 DIAGNOSIS — C50912 Malignant neoplasm of unspecified site of left female breast: Principal | ICD-10-CM

## 2018-02-10 DIAGNOSIS — C642 Malignant neoplasm of left kidney, except renal pelvis: Secondary | ICD-10-CM

## 2018-02-10 LAB — CBC WITH DIFFERENTIAL/PLATELET
Abs Immature Granulocytes: 0.02 10*3/uL (ref 0.00–0.07)
Basophils Absolute: 0.1 10*3/uL (ref 0.0–0.1)
Basophils Relative: 1 %
Eosinophils Absolute: 0.3 10*3/uL (ref 0.0–0.5)
Eosinophils Relative: 4 %
HCT: 44.6 % (ref 36.0–46.0)
Hemoglobin: 14.3 g/dL (ref 12.0–15.0)
Immature Granulocytes: 0 %
Lymphocytes Relative: 12 %
Lymphs Abs: 0.8 10*3/uL (ref 0.7–4.0)
MCH: 29.3 pg (ref 26.0–34.0)
MCHC: 32.1 g/dL (ref 30.0–36.0)
MCV: 91.4 fL (ref 80.0–100.0)
Monocytes Absolute: 0.4 10*3/uL (ref 0.1–1.0)
Monocytes Relative: 6 %
Neutro Abs: 5.2 10*3/uL (ref 1.7–7.7)
Neutrophils Relative %: 77 %
Platelets: 233 10*3/uL (ref 150–400)
RBC: 4.88 MIL/uL (ref 3.87–5.11)
RDW: 13.2 % (ref 11.5–15.5)
WBC: 6.7 10*3/uL (ref 4.0–10.5)
nRBC: 0 % (ref 0.0–0.2)

## 2018-02-10 LAB — COMPREHENSIVE METABOLIC PANEL
ALT: 16 U/L (ref 0–44)
AST: 19 U/L (ref 15–41)
Albumin: 3.8 g/dL (ref 3.5–5.0)
Alkaline Phosphatase: 50 U/L (ref 38–126)
Anion gap: 8 (ref 5–15)
BUN: 35 mg/dL — ABNORMAL HIGH (ref 8–23)
CO2: 30 mmol/L (ref 22–32)
Calcium: 10.1 mg/dL (ref 8.9–10.3)
Chloride: 103 mmol/L (ref 98–111)
Creatinine, Ser: 1.4 mg/dL — ABNORMAL HIGH (ref 0.44–1.00)
GFR calc Af Amer: 39 mL/min — ABNORMAL LOW (ref >60)
GFR calc non Af Amer: 33 mL/min — ABNORMAL LOW (ref >60)
Glucose, Bld: 91 mg/dL (ref 70–99)
Potassium: 4.5 mmol/L (ref 3.5–5.1)
Sodium: 141 mmol/L (ref 135–145)
Total Bilirubin: 0.8 mg/dL (ref 0.3–1.2)
Total Protein: 6.2 g/dL — ABNORMAL LOW (ref 6.5–8.1)

## 2018-02-10 NOTE — Progress Notes (Signed)
[facility] day:  02/10/2018  Chief Complaint: Tara Ramos is a 84 y.o. female with bilateral breast cancer (1990 and 1992) and bilateral renal cell carcinoma (2016) who is seen for 6 month assessment.  HPI:  The patient was last seen in the medical oncology clinic on 08/09/2017.  At that time, she denied any concerns. She had remained off Femara and Fosamax.  Exam revealed moderate scarring and fibrocystic changes in the RIGHT breast (prior breast biopsy site).  Renal function had slightly improved; BUN was 30 with a creatinine of 1.28 (CrCl 38 mL/min).   Bilateral diagnostic mammogram on 09/23/2017 revealed stable right breast lumpectomy site. There was no mammographic evidence of malignancy in the bilateral breasts.  Recommendation was diagnostic mammogram in 1 year.  Bone density was ordered, but not performed.  Appointment has been rescheduled for 02/2018. Of note, patient remains off of her oral bisphosphonate therapy despite education regarding the benefits of these medications.   CXR on 02/04/2018 revealed no evidence of active disease.  Abdomen and pelvic CT on 02/04/2018 revealed stable cryoablation changes involving the left kidney. There were no findings for recurrent tumor. Stable small left renal cysts. No worrisome renal lesions.  Status post right nephrectomy.  There was stable nodularity of the left adrenal gland, consistent with benign adenomas.  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  There was no abdominal/pelvic lymphadenopathy or other findings to suggest metastatic disease.  During the interim, patient has been doing "fine". She denies any acute concerns. Patient denies that she has experienced any B symptoms. She denies any interval infections.  No urinary complaints. Patient does not verbalize any concerns with regards to her breasts today. Patient performs monthly self breast examinations as  recommended. Patient has minor chronic edema in her lower extremities.   Patient advises that she maintains an adequate appetite. She is eating well. Weight today is 176 lb 7 oz (80 kg), which compared to her last visit to the clinic, represents a 3 pound decrease.    Patient denies pain in the clinic today.   Past Medical History:  Diagnosis Date  . Arthritis   . Breast cancer ([place]) 1992   right breast/radiation  . Breast cancer ([place]) 1990   left breast/radiation  . Breast cancer ([place]) 2014   right breast  . Breast cancer, right breast ([place]) 12/16/2012   left, then right, then right breast cancers (lumpectomies and radiation therapy)  . Chronic kidney disease   . Dysrhythmia    HX A FIB - FOLLOWED BY [name]  . GERD (gastroesophageal reflux disease)   . Heart murmur    hx of years ago   . Hemorrhoids   . Hypertension   . Personal history of radiation therapy    1990/1992  . Renal cell cancer, right ([place]) 11/2014   Right Nephrectomy and Left renal ablation for lesion.   . Renal insufficiency   . Status post radiation therapy    breast cancer bilateral    Past Surgical History:  Procedure Laterality Date  . BREAST BIOPSY Right 03/10/2016   -  STROMAL SCLEROSIS, GIANT CELL REACTION, CHRONIC INFLAMMATION AND FAT   . BREAST EXCISIONAL BIOPSY Right 2014   +  . BREAST EXCISIONAL BIOPSY Right 1992   +  . BREAST EXCISIONAL BIOPSY Left 1990   +  . BREAST LUMPECTOMY Left 1990  . BREAST LUMPECTOMY Right 1992  . DILATION AND  CURETTAGE OF UTERUS    . EYE SURGERY Bilateral    cataract extraction  . KYPHOPLASTY N/A 10/09/2014   Procedure: KYPHOPLASTY;  Surgeon: Hessie Knows, MD;  Location: [facility] ORS;  Service: Orthopedics;  Laterality: N/A;  T7 Kyphoplasty with bone biopsy  . OTHER SURGICAL HISTORY     radiation therapy breast  . ROBOT ASSISTED LAPAROSCOPIC NEPHRECTOMY Right 02/01/2015   Procedure: ROBOTIC ASSISTED LAPAROSCOPIC RIGHT NEPHRECTOMY;  Surgeon: Cleon Gustin,  MD;  Location: [facility] ORS;  Service: Urology;  Laterality: Right;    Family History  Problem Relation Age of Onset  . Stomach cancer Mother   . Stroke Father   . Brain cancer Sister   . Breast cancer Daughter 69  . Bladder Cancer Neg Hx   . Prostate cancer Neg Hx   . Kidney cancer Neg Hx     Social History:  reports that she quit smoking about 45 years ago. Her smoking use included cigarettes. She quit after 20.00 years of use. She has never used smokeless tobacco. She reports that she drinks about 1.0 standard drinks of alcohol per week. She reports that she does not use drugs.  She has worked for [employer].  Previously lived in [place] .  She has lived in [place] x 23 years. The patient is alone today.  Allergies:  Allergies  Allergen Reactions  . Morphine And Related Nausea And Vomiting  . Lisinopril Cough  . Tape Other (See Comments)    Blisters underneath; wide white hypofix tape post kidney biopsy     Current Medications: Current Outpatient Medications  Medication Sig Dispense Refill  . amLODipine (NORVASC) 5 MG tablet Take 7.5 mg by mouth every morning.    . Ascorbic Acid (VITAMIN C) 1000 MG tablet Take 1,000 mg by mouth daily.    . Calcium Carb-Cholecalciferol (CALCIUM + D3) 600-200 MG-UNIT TABS Take 1 tablet by mouth daily.     Marland Kitchen docusate sodium (COLACE) 100 MG capsule Take 100 mg by mouth at bedtime.    . hydrochlorothiazide (HYDRODIURIL) 25 MG tablet Take 50 mg by mouth daily.    Marland Kitchen losartan (COZAAR) 100 MG tablet Take 100 mg by mouth daily.    . metoprolol (LOPRESSOR) 100 MG tablet Take 100 mg by mouth 2 (two) times daily.    . Multiple Vitamin (MULTIVITAMIN WITH MINERALS) TABS tablet Take 1 tablet by mouth daily.     No current facility-administered medications for this visit.     Review of Systems:  GENERAL:  Feels "fine".  No fevers, sweats.  Weight loss of 2 pounds. PERFORMANCE STATUS (ECOG):  0 HEENT:  No visual changes, runny nose, sore throat,  mouth sores or tenderness. Lungs: No shortness of breath or cough.  No hemoptysis. Cardiac:  No chest pain, palpitations, orthopnea, or PND. GI:  No nausea, vomiting, diarrhea, constipation, melena or hematochezia. GU:  No urgency, frequency, dysuria, or hematuria. Musculoskeletal:  No back pain.  No joint pain.  No muscle tenderness. Extremities:  No pain or swelling. Skin:  No rashes or skin changes. Neuro:  No headache, numbness or weakness, balance or coordination issues. Endocrine:  No diabetes, thyroid issues, hot flashes or night sweats. Psych:  No mood changes, depression or anxiety. Pain:  No focal pain. Review of systems:  All other systems reviewed and found to be negative.   Physical Exam: Blood pressure (!) 185/82, pulse (!) 52, temperature 98.2 F (36.8 C), temperature source Tympanic, resp. rate 18, weight 176 lb 7  oz (80 kg). GENERAL:  Well developed, well nourished, woman sitting comfortably in the exam room in no acute distress. MENTAL STATUS:  Alert and oriented to person, place and time. HEAD:  Pearline Cables hair.  Normocephalic, atraumatic, face symmetric, no Cushingoid features. EYES:  Glasses.  Blue eyes.  Pupils equal round and reactive to light and accomodation.  No conjunctivitis or scleral icterus. ENT:  Oropharynx clear without lesion.  Tongue normal. Mucous membranes moist.  RESPIRATORY:  Clear to auscultation without rales, wheezes or rhonchi. CARDIOVASCULAR:  Regular rate and rhythm without murmur, rub or gallop. BREAST:  Right breast 3 cm scarring at 10 o'clock (no change). No discrete masses, skin changes or nipple discharge.  Left breast without masses, skin changes or nipple discharge. ABDOMEN:  Soft, non-tender, with active bowel sounds, and no hepatosplenomegaly.  No masses. SKIN:  No rashes, ulcers or lesions. EXTREMITIES: No edema, no skin discoloration or tenderness.  No palpable cords. LYMPH NODES: No palpable cervical, supraclavicular, axillary or inguinal  adenopathy  NEUROLOGICAL: Unremarkable. PSYCH:  Appropriate.    Appointment on 02/10/2018  Component Date Value Ref Range Status  . WBC 02/10/2018 6.7  4.0 - 10.5 K/uL Final  . RBC 02/10/2018 4.88  3.87 - 5.11 MIL/uL Final  . Hemoglobin 02/10/2018 14.3  12.0 - 15.0 g/dL Final  . HCT 02/10/2018 44.6  36.0 - 46.0 % Final  . MCV 02/10/2018 91.4  80.0 - 100.0 fL Final  . MCH 02/10/2018 29.3  26.0 - 34.0 pg Final  . MCHC 02/10/2018 32.1  30.0 - 36.0 g/dL Final  . RDW 02/10/2018 13.2  11.5 - 15.5 % Final  . Platelets 02/10/2018 233  150 - 400 K/uL Final  . nRBC 02/10/2018 0.0  0.0 - 0.2 % Final  . Neutrophils Relative % 02/10/2018 77  % Final  . Neutro Abs 02/10/2018 5.2  1.7 - 7.7 K/uL Final  . Lymphocytes Relative 02/10/2018 12  % Final  . Lymphs Abs 02/10/2018 0.8  0.7 - 4.0 K/uL Final  . Monocytes Relative 02/10/2018 6  % Final  . Monocytes Absolute 02/10/2018 0.4  0.1 - 1.0 K/uL Final  . Eosinophils Relative 02/10/2018 4  % Final  . Eosinophils Absolute 02/10/2018 0.3  0.0 - 0.5 K/uL Final  . Basophils Relative 02/10/2018 1  % Final  . Basophils Absolute 02/10/2018 0.1  0.0 - 0.1 K/uL Final  . Immature Granulocytes 02/10/2018 0  % Final  . Abs Immature Granulocytes 02/10/2018 0.02  0.00 - 0.07 K/uL Final   Performed at [facility], [address] [place], [place] [address]  . Sodium 02/10/2018 141  135 - 145 mmol/L Final  . Potassium 02/10/2018 4.5  3.5 - 5.1 mmol/L Final  . Chloride 02/10/2018 103  98 - 111 mmol/L Final  . CO2 02/10/2018 30  22 - 32 mmol/L Final  . Glucose, Bld 02/10/2018 91  70 - 99 mg/dL Final  . BUN 02/10/2018 35* 8 - 23 mg/dL Final  . Creatinine, Ser 02/10/2018 1.40* 0.44 - 1.00 mg/dL Final  . Calcium 02/10/2018 10.1  8.9 - 10.3 mg/dL Final  . Total Protein 02/10/2018 6.2* 6.5 - 8.1 g/dL Final  . Albumin 02/10/2018 3.8  3.5 - 5.0 g/dL Final  . AST 02/10/2018 19  15 - 41 U/L Final  . ALT 02/10/2018 16  0 - 44 U/L Final  . Alkaline Phosphatase  02/10/2018 50  38 - 126 U/L Final  . Total Bilirubin 02/10/2018 0.8  0.3 - 1.2 mg/dL  Final  . GFR calc non Af Amer 02/10/2018 33* >60 mL/min Final  . GFR calc Af Amer 02/10/2018 39* >60 mL/min Final   Comment: (NOTE) The eGFR has been calculated using the CKD EPI equation. This calculation has not been validated in all clinical situations. eGFR's persistently <60 mL/min signify possible Chronic Kidney Disease.   Georgiann Hahn gap 02/10/2018 8  5 - 15 Final   Performed at [facility], [address] [place], [place] [address]    Assessment:  DAKIA SCHIFANO is a 84 y.o. female with a history of bilateral breast cancer (1990 and 1992) and bilateral renal cell carcinoma (2016).  She was diagnosed with left breast cancer in 1990 and is s/p lumpectomy and radiation.  She was diagnosed with right breast cancer in 1992 and is s/p lumpectomy and radiation.  She was on tamoxifen x 5 years.  She developed recurrent disease in the right breast in 2014.  She underwent lumpectomy on 12/16/2012.  Pathology revealed a 1.1 cm grade II invasive mammary carcinoma with DCIS.  Margins were negative.  Tumor was ER + (> 90%), PR+ (10%), and Her2/neu negative FISH.  Pathologic stage was T1cNxM0.  She began Femara in 12/2012.  She stopped Femara prior to her 07/2016 appointment.  Mammogram on 09/12/2015 revealed no malignancy.  Right sided mammogram and ultrasound on 02/25/2016 revealed increased density and spiculation of the right breast upper outer quadrant, middle depth, lumpectomy scar, with possible 1.4 cm mass at the superficial portion of the lumpectomy site.  Bilateral mammogram on 09/18/2016 revealed no evidence of malignancy.  Ultrasound guided biopsy of the right breast lesion at 11:00 position on 03/10/2016 was negative for malignancy.  There was stromal sclerosis, giant cell reaction, chronic inflammation and fat negative.     CA27.29 has been followed: 20.3 on 08/09/2017 and 20.4 on  02/10/2018.  She was diagnosed with renal cell carcinoma in 2016.  CT scan revealed a 4.5 x 2.8 x 2.2 cm right medial enhancing renal mass along with a 1.6 cm enhancing inferior pole renal mass. On the left, there was a 1.9 cm LEFT enhancing renal mass.  CT guided right renal biopsy on 10/31/2014 revealed renal cell carcinoma with eosinophilic cytoplasm and rhabdoid features, Fuhrman grade 4.   No biopsy was performed on the left kidney.   She underwent right robotic nephrectomy on 02/01/[facility].  Pathology revealed a 4 cm clear cell renal cell carcinoma, Fuhrman grade 4 of the right upper pole.  There was no lymph-vascular invasion. The inferior pole lesion was not consistent with malignancy.  Pathologic stage was T1aNxM0 (stage I).  She underwent CT-guided microwave ablation of her left renal lesion on 05/24/2015.  The procedure was complicated by a left pleural effusion (resolved spontaneously).  Abdomen CT scan on 02/06/2016 revealed no evidence of recurrent disease.   Abdomen and pelvis CT  on 02/09/2017 demonstrated a subtle 2.5 x 2.0 cm hypervascular lesion in segment 1 of the liver. Imaging features are compatible with a benign lesion, likely focal nodular hyperplasia ([place]). Several low-attenuation lesions in the LEFT kidney are compatible with simple cysts, measuring up to 2.1 cm in the interpolar region. Stable 1.7 x 1.3 cm left adrenal adenoma also mentioned. There were no findings to suggest locally recurrent or metastatic disease in the abdomen or pelvis. Abdomen and pelvic CT on 02/04/2018 revealed stable cryoablation changes involving the left kidney. There were no findings for recurrent tumor. Stable small left renal cysts. No  worrisome renal lesions.  Status post right nephrectomy.  There was stable nodularity of the left adrenal gland, consistent with benign adenomas.  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  There was no abdominal/pelvic  lymphadenopathy or other findings to suggest metastatic disease.  Bone density study in 01/2016 revealed osteoporosis with a T score of -2.9 in the left femoral neck.  She takes calcium and vitamin D.  She stopped Fosamax.  Chest CT on 08/17/2014 revealed no evidence for lung metastases but an incidental T7 compression fracture concerning for metastatic disease.  Bone scan was concerning for pathological fracture.  She underwent a kyphoplasty with biopsy on 10/09/2014.  Pathology was negative for malignancy. She had an incidental 11 mm left thyroid nodule.  She has renal insufficiency.  Creatinine pre-op was 0.96 in 07/2014.   Creatinine was 1.40 on 07/25/2015, 1.59 (CrCl 29 ml/min) on 02/10/2016, 1.59 02/08/2017, and 1.40 on 02/10/2018.  Symptomatically, she denies any complaints.  She has significant scarring at 10 o'clock in the right breast (stable).    Plan: 1.  Labs today:  CBC with diff, CMP, CA27.29. 2.  Bilateral breast cancer:  Clinically, she is doing well.  Review interval mammogram- no evidence of malignancy.  Exam reveals no evidence of recurrent disease.  CA27.29 is normal. 3.  Bilateral renal cell carcinoma:  Review interval abdomen and pelvic CT- no evidence of recurrent disease.  Continue surveillance. 4.  Osteoporosis:  Bone density study rescheduled for 02/2018.  Patient remains off Fosamax.  Discuss importance of management and risk of fracture. 5.  RTC in 1 year for MD assessment and labs (CBC with diff, CMP, CA27.29).   Honor Loh, NP  02/10/2018, 11:04 AM   I saw and evaluated the patient, participating in the key portions of the service and reviewing pertinent diagnostic studies and records.  I reviewed the nurse practitioner's note and agree with the findings and the plan.  The assessment and plan were discussed with the patient.  Several questions were asked by the patient and answered.   Nolon Stalls, MD 02/10/2018,11:04 AM

## 2018-02-10 NOTE — Progress Notes (Signed)
Patient offers no complaints today. 

## 2018-02-11 LAB — CANCER ANTIGEN 27.29: CA 27.29: 20.4 U/mL (ref 0.0–38.6)

## 2018-02-14 ENCOUNTER — Other Ambulatory Visit: Payer: PPO

## 2018-02-15 ENCOUNTER — Encounter: Payer: Self-pay | Admitting: Urology

## 2018-02-15 ENCOUNTER — Ambulatory Visit (INDEPENDENT_AMBULATORY_CARE_PROVIDER_SITE_OTHER): Payer: PPO | Admitting: Urology

## 2018-02-15 VITALS — BP 171/78 | HR 62 | Ht 62.0 in | Wt 172.0 lb

## 2018-02-15 DIAGNOSIS — Q600 Renal agenesis, unilateral: Secondary | ICD-10-CM | POA: Diagnosis not present

## 2018-02-15 DIAGNOSIS — N183 Chronic kidney disease, stage 3 unspecified: Secondary | ICD-10-CM

## 2018-02-15 DIAGNOSIS — K769 Liver disease, unspecified: Secondary | ICD-10-CM | POA: Diagnosis not present

## 2018-02-15 DIAGNOSIS — Z85528 Personal history of other malignant neoplasm of kidney: Secondary | ICD-10-CM | POA: Diagnosis not present

## 2018-02-15 DIAGNOSIS — IMO0002 Reserved for concepts with insufficient information to code with codable children: Secondary | ICD-10-CM

## 2018-02-15 NOTE — Progress Notes (Signed)
02/15/2018 10:30 AM   Tara Ramos 1931-08-10 [id number]  Referring provider: Adin Hector, MD [facility] [place], [place] [address]  Chief Complaint  Patient presents with  . RCC    1year w/results    HPI: 84 year old with bilateral renal masses s/p right robotic nephrectomy in [place] by Dr. Alyson Ingles in 02/01/15 and CT guided microwave ablation of the left renal mass 05/24/15.  She returns today for routine annual surveillance with imaging.  She was last seen 1 year ago.  Last visit, she is doing well.  She denies any flank pain or gross hematuria.  She has had no new significant diagnoses.  She is followed by Dr. Mike Gip in the cancer center for history of breast cancer without recurrence.   She returns with annual cross sectional imaging in the form of CT abd/ pelvis with contrast which shows stable left croablation changes on the left and no recurrence on right.  All other findings benign.    Creatinine on 07/2014 0.96 preop.   Most recent creatinine on 01/2018 1.40.   RCC history Initial imaging by MRI to confirm tthe size and location of these lesions. She was noted to have a 4.5 x 2.8 x 2.2 cm RIGHT medial  enhancing renal mass along with a 1.6 cm enhancing inferior pole renal mass. On the LEFT, she was found to have a 1.9 cm LEFT enhancing renal mass as well.    She underwent R robotic nephrectomy per Dr. Alyson Ingles on 02/01/2015.  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 The inferior pole lesion was not consistent with malignancy.  No issues postoperatively.   She then underwent successful CT-guided microwave ablation of her left renal lesion on 05/24/2015.  This was complicated by a left pleural effusion which resolved spontaneously.CXR also negative.      PMH: Past Medical History:  Diagnosis Date  . Arthritis   . Breast cancer ([place]) 1992   right breast/radiation  .  Breast cancer ([place]) 1990   left breast/radiation  . Breast cancer ([place]) 2014   right breast  . Breast cancer, right breast ([place]) 12/16/2012   left, then right, then right breast cancers (lumpectomies and radiation therapy)  . Chronic kidney disease   . Dysrhythmia    HX A FIB - FOLLOWED BY [name]  . GERD (gastroesophageal reflux disease)   . Heart murmur    hx of years ago   . Hemorrhoids   . Hypertension   . Personal history of radiation therapy    1990/1992  . Renal cell cancer, right ([place]) 11/2014   Right Nephrectomy and Left renal ablation for lesion.   . Renal insufficiency   . Status post radiation therapy    breast cancer bilateral    Surgical History: Past Surgical History:  Procedure Laterality Date  . BREAST BIOPSY Right 03/10/2016   -  STROMAL SCLEROSIS, GIANT CELL REACTION, CHRONIC INFLAMMATION AND FAT   . BREAST EXCISIONAL BIOPSY Right 2014   +  . BREAST EXCISIONAL BIOPSY Right 1992   +  . BREAST EXCISIONAL BIOPSY Left 1990   +  . BREAST LUMPECTOMY Left 1990  . BREAST LUMPECTOMY Right 1992  . DILATION AND CURETTAGE OF UTERUS    . EYE SURGERY Bilateral    cataract extraction  . KYPHOPLASTY N/A 10/09/2014   Procedure: KYPHOPLASTY;  Surgeon: Hessie Knows, MD;  Location: [facility] ORS;  Service:  Orthopedics;  Laterality: N/A;  T7 Kyphoplasty with bone biopsy  . OTHER SURGICAL HISTORY     radiation therapy breast  . ROBOT ASSISTED LAPAROSCOPIC NEPHRECTOMY Right 02/01/2015   Procedure: ROBOTIC ASSISTED LAPAROSCOPIC RIGHT NEPHRECTOMY;  Surgeon: Cleon Gustin, MD;  Location: [facility];  Service: Urology;  Laterality: Right;    Home Medications:  Allergies as of 02/15/2018      Reactions   Morphine And Related Nausea And Vomiting   Lisinopril Cough   Tape Other (See Comments)   Blisters underneath; wide white hypofix tape post kidney biopsy       Medication List        Accurate as of 02/15/18 10:30 AM. Always use your most recent med list.            amLODipine 5 MG tablet Commonly known as:  NORVASC Take 7.5 mg by mouth every morning.   Calcium + D3 600-200 MG-UNIT Tabs Take 1 tablet by mouth daily.   docusate sodium 100 MG capsule Commonly known as:  COLACE Take 100 mg by mouth at bedtime.   hydrochlorothiazide 25 MG tablet Commonly known as:  HYDRODIURIL Take 50 mg by mouth daily.   losartan 100 MG tablet Commonly known as:  COZAAR Take 100 mg by mouth daily.   metoprolol tartrate 100 MG tablet Commonly known as:  LOPRESSOR Take 100 mg by mouth 2 (two) times daily.   multivitamin with minerals Tabs tablet Take 1 tablet by mouth daily.   vitamin C 1000 MG tablet Take 1,000 mg by mouth daily.       Allergies:  Allergies  Allergen Reactions  . Morphine And Related Nausea And Vomiting  . Lisinopril Cough  . Tape Other (See Comments)    Blisters underneath; wide white hypofix tape post kidney biopsy     Family History: Family History  Problem Relation Age of Onset  . Stomach cancer Mother   . Stroke Father   . Brain cancer Sister   . Breast cancer Daughter 75  . Bladder Cancer Neg Hx   . Prostate cancer Neg Hx   . Kidney cancer Neg Hx     Social History:  reports that she quit smoking about 45 years ago. Her smoking use included cigarettes. She quit after 20.00 years of use. She has never used smokeless tobacco. She reports that she drinks about 1.0 standard drinks of alcohol per week. She reports that she does not use drugs.  ROS: UROLOGY Frequent Urination?: No Hard to postpone urination?: No Burning/pain with urination?: No Get up at night to urinate?: No Leakage of urine?: No Urine stream starts and stops?: No Trouble starting stream?: No Do you have to strain to urinate?: No Blood in urine?: No Urinary tract infection?: No Sexually transmitted disease?: No Injury to kidneys or bladder?: No Painful intercourse?: No Weak stream?: No Currently pregnant?: No Vaginal bleeding?: No Last  menstrual period?: n  Gastrointestinal Nausea?: No Vomiting?: No Indigestion/heartburn?: No Diarrhea?: No Constipation?: No  Constitutional Fever: No Night sweats?: No Weight loss?: No Fatigue?: No  Skin Skin rash/lesions?: No Itching?: No  Eyes Blurred vision?: No Double vision?: No  Ears/Nose/Throat Sore throat?: No Sinus problems?: No  Hematologic/Lymphatic Swollen glands?: No Easy bruising?: No  Cardiovascular Leg swelling?: No  Respiratory Cough?: No Shortness of breath?: No  Endocrine Excessive thirst?: No  Musculoskeletal Back pain?: No Joint pain?: No  Neurological Headaches?: No Dizziness?: No  Psychologic Depression?: No Anxiety?: No  Physical Exam: BP (!) 171/78  Pulse 62   Ht 5\' 2"  (1.575 m)   Wt 172 lb (78 kg)   BMI 31.46 kg/m   Constitutional:  Alert and oriented, No acute distress. HEENT: [place] AT, moist mucus membranes.  Trachea midline, no masses. Cardiovascular: No clubbing, cyanosis, or edema. Respiratory: Normal respiratory effort, no increased work of breathing. Skin: No rashes, bruises or suspicious lesions. Neurologic: Grossly intact, no focal deficits, moving all 4 extremities. Psychiatric: Normal mood and affect.  Laboratory Data: Lab Results  Component Value Date   WBC 6.7 02/10/2018   HGB 14.3 02/10/2018   HCT 44.6 02/10/2018   MCV 91.4 02/10/2018   PLT 233 02/10/2018    Lab Results  Component Value Date   CREATININE 1.40 (H) 02/10/2018   Urinalysis N/a  Pertinent Imaging: CLINICAL DATA:  Renal cell carcinoma.  No chest complaints  EXAM: CHEST - 2 VIEW  COMPARISON:  02/12/2017  FINDINGS: There is no focal consolidation. There is left basilar scarring. There is no pleural effusion or pneumothorax. There is stable cardiomegaly. There is thoracic aortic atherosclerosis.  The osseous structures are unremarkable.  IMPRESSION: No active cardiopulmonary disease.   Electronically Signed    By: Kathreen Devoid   On: 02/04/2018 13:33  CLINICAL DATA:  Followup left renal cryoablation procedure. History of bilateral renal cell carcinomas. The patient is status post right nephrectomy.  EXAM: CT ABDOMEN AND PELVIS WITHOUT AND WITH CONTRAST  TECHNIQUE: Multidetector CT imaging of the abdomen and pelvis was performed following the standard protocol before and following the bolus administration of intravenous contrast.  CONTRAST:  49mL ISOVUE-300 IOPAMIDOL (ISOVUE-300) INJECTION 61%  COMPARISON:  02/09/2017  FINDINGS: Lower chest: The lung bases are clear of acute process. No pleural effusion or worrisome pulmonary lesions. The heart is mildly enlarged but stable. No pericardial effusion. Stable advanced atherosclerotic calcifications involving the thoracic aorta and coronary arteries. The distal esophagus is grossly normal.  Hepatobiliary: Stable 23 mm arterial phase enhancing lesion in the upper caudate lobe adjacent to the IVC. 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 No other hepatic lesions. No intrahepatic biliary dilatation. The gallbladder demonstrates stable calcifications in the fundal wall region. This could be due to focal chronic adenomyomatosis.  Pancreas: No mass, inflammation or ductal dilatation.  Spleen: Normal size.  No focal lesions.  Adrenals/Urinary Tract: Stable nodularity of the left adrenal gland. The right adrenal gland is unremarkable and stable.  The right kidney is surgically absent. No findings for residual or recurrent tumor in the nephrectomy bed.  The left kidney demonstrates stable changes related to a prior cryoablation procedure involving the medial aspect of the kidney. No findings suspicious for residual or recurrent tumor.  Stable simple appearing exophytic left renal cysts. No worrisome left renal lesions. No hydronephrosis.  The bladder is unremarkable.  Stomach/Bowel: The stomach, duodenum, small bowel and colon  are grossly normal without oral contrast. No inflammatory changes, mass lesions or obstructive findings.  Vascular/Lymphatic: Stable atherosclerotic calcifications involving the aorta and iliac arteries but no aneurysm or dissection. The branch vessels are patent. Branch vessel ostial calcifications are noted. Dense calcifications involving the splenic artery. The major venous structures are patent.  No mesenteric or retroperitoneal mass or lymphadenopathy.  Reproductive: The uterus and ovaries are unremarkable and stable.  Other: Stable low anterior abdominal wall hernia containing small bowel loops but no findings for obstruction or incarceration.  Musculoskeletal: No significant bony findings. No findings suspicious for osseous metastatic disease.  IMPRESSION: 1. Stable cryoablation changes involving the left kidney. No  findings for recurrent tumor. Stable small left renal cysts. No worrisome renal lesions. 2. Status post right nephrectomy. 3. Stable nodularity of the left adrenal gland, consistent with benign adenomas. 4. 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 5. No abdominal/pelvic lymphadenopathy or other findings to suggest metastatic disease.   Electronically Signed   By: Marijo Sanes M.D.   On: 02/04/2018 11:38  CT of the abdomen pelvis as well as chest x-ray was personally reviewed.  No evidence of recurrence or metastatic disease.  Assessment & Plan:    1. History of renal cell cancer (bilateral) S/p right robotic nephrectomy 01/2015, no evidence of recurrence Status post left thermal ablation in 04/2015, no evidence of recurrence We discussed that this point, its been 3 years since her initial diagnosis and treatment, as such given the smaller size of her lesions, recommend transition to less expensive imaging modality in the form of renal ultrasound Technically, per NCCN guidelines, no further follow-up is needed after 3 years,  however given her multifocal lesions, I do think that continued surveillance is strongly recommended F/u in 1 year with RUS - US RENAL; Future  2. Chronic renal insufficiency, stage 3 (moderate) (HCC) Creatinine stable, 1.4  3. Solitary kidney Reviewed solitary kidney precautions again  4. Liver lesion Lesion in the liver is stable, consistent with benign etiology   Return in about 1 year (around 02/16/2019) for RUS.  Hollice Espy, MD  [facility] [address] [place],  [address] [phone]

## 2018-02-24 DIAGNOSIS — C642 Malignant neoplasm of left kidney, except renal pelvis: Secondary | ICD-10-CM | POA: Diagnosis not present

## 2018-02-24 DIAGNOSIS — N183 Chronic kidney disease, stage 3 (moderate): Secondary | ICD-10-CM | POA: Diagnosis not present

## 2018-02-24 DIAGNOSIS — I10 Essential (primary) hypertension: Secondary | ICD-10-CM | POA: Diagnosis not present

## 2018-02-24 DIAGNOSIS — E7849 Other hyperlipidemia: Secondary | ICD-10-CM | POA: Diagnosis not present

## 2018-03-03 DIAGNOSIS — N183 Chronic kidney disease, stage 3 (moderate): Secondary | ICD-10-CM | POA: Diagnosis not present

## 2018-03-03 DIAGNOSIS — E7849 Other hyperlipidemia: Secondary | ICD-10-CM | POA: Diagnosis not present

## 2018-03-03 DIAGNOSIS — M810 Age-related osteoporosis without current pathological fracture: Secondary | ICD-10-CM | POA: Diagnosis not present

## 2018-03-03 DIAGNOSIS — Z8781 Personal history of (healed) traumatic fracture: Secondary | ICD-10-CM | POA: Diagnosis not present

## 2018-03-03 DIAGNOSIS — C50911 Malignant neoplasm of unspecified site of right female breast: Secondary | ICD-10-CM | POA: Diagnosis not present

## 2018-03-03 DIAGNOSIS — C50912 Malignant neoplasm of unspecified site of left female breast: Secondary | ICD-10-CM | POA: Diagnosis not present

## 2018-03-03 DIAGNOSIS — C642 Malignant neoplasm of left kidney, except renal pelvis: Secondary | ICD-10-CM | POA: Diagnosis not present

## 2018-03-03 DIAGNOSIS — I700 Atherosclerosis of aorta: Secondary | ICD-10-CM | POA: Diagnosis not present

## 2018-03-03 DIAGNOSIS — K219 Gastro-esophageal reflux disease without esophagitis: Secondary | ICD-10-CM | POA: Diagnosis not present

## 2018-03-03 DIAGNOSIS — I2510 Atherosclerotic heart disease of native coronary artery without angina pectoris: Secondary | ICD-10-CM | POA: Diagnosis not present

## 2018-03-03 DIAGNOSIS — Z0000 Encounter for general adult medical examination without abnormal findings: Secondary | ICD-10-CM | POA: Diagnosis not present

## 2018-03-03 DIAGNOSIS — I10 Essential (primary) hypertension: Secondary | ICD-10-CM | POA: Diagnosis not present

## 2018-03-15 DIAGNOSIS — M810 Age-related osteoporosis without current pathological fracture: Secondary | ICD-10-CM | POA: Diagnosis not present

## 2018-08-25 DIAGNOSIS — N183 Chronic kidney disease, stage 3 (moderate): Secondary | ICD-10-CM | POA: Diagnosis not present

## 2018-08-25 DIAGNOSIS — C642 Malignant neoplasm of left kidney, except renal pelvis: Secondary | ICD-10-CM | POA: Diagnosis not present

## 2018-08-25 DIAGNOSIS — I10 Essential (primary) hypertension: Secondary | ICD-10-CM | POA: Diagnosis not present

## 2018-08-25 DIAGNOSIS — E7849 Other hyperlipidemia: Secondary | ICD-10-CM | POA: Diagnosis not present

## 2018-08-25 DIAGNOSIS — M810 Age-related osteoporosis without current pathological fracture: Secondary | ICD-10-CM | POA: Diagnosis not present

## 2018-09-01 ENCOUNTER — Other Ambulatory Visit: Payer: Self-pay | Admitting: Internal Medicine

## 2018-09-01 DIAGNOSIS — I10 Essential (primary) hypertension: Secondary | ICD-10-CM | POA: Diagnosis not present

## 2018-09-01 DIAGNOSIS — I700 Atherosclerosis of aorta: Secondary | ICD-10-CM | POA: Diagnosis not present

## 2018-09-01 DIAGNOSIS — E7849 Other hyperlipidemia: Secondary | ICD-10-CM | POA: Diagnosis not present

## 2018-09-01 DIAGNOSIS — Z85528 Personal history of other malignant neoplasm of kidney: Secondary | ICD-10-CM | POA: Diagnosis not present

## 2018-09-01 DIAGNOSIS — K219 Gastro-esophageal reflux disease without esophagitis: Secondary | ICD-10-CM | POA: Diagnosis not present

## 2018-09-01 DIAGNOSIS — M810 Age-related osteoporosis without current pathological fracture: Secondary | ICD-10-CM | POA: Diagnosis not present

## 2018-09-01 DIAGNOSIS — I2510 Atherosclerotic heart disease of native coronary artery without angina pectoris: Secondary | ICD-10-CM | POA: Diagnosis not present

## 2018-09-01 DIAGNOSIS — Z1231 Encounter for screening mammogram for malignant neoplasm of breast: Secondary | ICD-10-CM

## 2018-09-01 DIAGNOSIS — N183 Chronic kidney disease, stage 3 (moderate): Secondary | ICD-10-CM | POA: Diagnosis not present

## 2018-11-07 ENCOUNTER — Ambulatory Visit
Admission: RE | Admit: 2018-11-07 | Discharge: 2018-11-07 | Disposition: A | Discharge status: Home / Self Care | Payer: PPO | Source: Ambulatory Visit | Attending: Internal Medicine | Admitting: Internal Medicine

## 2018-11-07 ENCOUNTER — Other Ambulatory Visit: Payer: Self-pay

## 2018-11-07 DIAGNOSIS — Z1231 Encounter for screening mammogram for malignant neoplasm of breast: Secondary | ICD-10-CM | POA: Insufficient documentation

## 2019-02-09 ENCOUNTER — Telehealth: Payer: Self-pay | Admitting: *Deleted

## 2019-02-09 NOTE — Telephone Encounter (Signed)
Pt was a transfer of care from Dr. Kem Parkinson team for her 1 year f/u.  Due to Dr. Sharmaine Base pal, I contacted the pt to r/s her apt to see either NP on 10/19 or r/s to another day with Dr. Jacinto Reap. The patient requests that all apt in cctr be cnl. she no longer wants f/u in the clinic.

## 2019-02-10 ENCOUNTER — Other Ambulatory Visit: Payer: Self-pay

## 2019-02-10 ENCOUNTER — Ambulatory Visit
Admission: RE | Admit: 2019-02-10 | Discharge: 2019-02-10 | Disposition: A | Discharge status: Home / Self Care | Payer: PPO | Source: Ambulatory Visit | Attending: Urology | Admitting: Urology

## 2019-02-10 ENCOUNTER — Ambulatory Visit: Payer: PPO

## 2019-02-10 DIAGNOSIS — Z85528 Personal history of other malignant neoplasm of kidney: Secondary | ICD-10-CM | POA: Diagnosis not present

## 2019-02-10 DIAGNOSIS — N281 Cyst of kidney, acquired: Secondary | ICD-10-CM | POA: Diagnosis not present

## 2019-02-13 ENCOUNTER — Other Ambulatory Visit: Payer: PPO

## 2019-02-13 ENCOUNTER — Inpatient Hospital Stay: Payer: PPO | Admitting: Nurse Practitioner

## 2019-02-13 ENCOUNTER — Ambulatory Visit: Payer: PPO | Admitting: Hematology and Oncology

## 2019-02-13 ENCOUNTER — Inpatient Hospital Stay: Payer: PPO

## 2019-02-14 ENCOUNTER — Ambulatory Visit: Payer: PPO | Admitting: Urology

## 2019-02-14 ENCOUNTER — Encounter: Payer: Self-pay | Admitting: Urology

## 2019-02-14 ENCOUNTER — Other Ambulatory Visit: Payer: Self-pay

## 2019-02-14 VITALS — BP 201/90 | HR 52 | Ht 62.0 in | Wt 177.0 lb

## 2019-02-14 DIAGNOSIS — N183 Chronic kidney disease, stage 3 unspecified: Secondary | ICD-10-CM

## 2019-02-14 DIAGNOSIS — Z85528 Personal history of other malignant neoplasm of kidney: Secondary | ICD-10-CM | POA: Diagnosis not present

## 2019-02-14 NOTE — Progress Notes (Signed)
02/14/2019 2:48 PM   Tara Ramos 03-Feb-1932 [id number]  Referring provider: Adin Hector, MD [place] [facility] [place],  [place] [address]  Chief Complaint  Patient presents with  . RCC    1year w/RUS    HPI: 85 year old with bilateral renal masses s/p right robotic nephrectomy in [place] by Dr. Alyson Ingles in 02/01/15 and CT guided microwave ablation of the left renal mass 05/24/15.She returns today for routine annual surveillance with imaging.  She was last seen 1 year ago.  She continues to do well.  She has no new medical issues or any significant changes in her medical history over the past year.  No flank pain or weight loss.  Then has been 3 years since her last procedure, we elected to follow-up with renal ultrasound but her history of multifocal disease and a solitary kidney.  Renal ultrasound is unremarkable.  Cr 1.4 on 07/2018, unchanged from previous year.     RCC history Initial imaging by MRI to confirm tthe size and location of these lesions. She was noted to have a 4.5 x 2.8 x 2.2 cm RIGHT medial enhancing renal mass along with a 1.6 cm enhancing inferior pole renal mass. On the LEFT, she was found to have a 1.9 cm LEFT enhancing renal mass as well.   She underwent R roboticnephrectomy per Dr. Alyson Ingles on 02/01/2015. 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 The inferior pole lesion was not consistent with malignancy. No issues postoperatively.  She then underwent successful CT-guided microwave ablation of her left renal lesion on 05/24/2015. This was complicated by a left pleural effusion which resolved spontaneously.CXR also negative.      PMH: Past Medical History:  Diagnosis Date  . Arthritis   . Breast cancer ([place]) 1992   right breast/radiation  . Breast cancer ([place]) 1990   left breast/radiation  . Breast cancer ([place]) 2014   right breast  . Breast  cancer, right breast ([place]) 12/16/2012   left, then right, then right breast cancers (lumpectomies and radiation therapy)  . Chronic kidney disease   . Dysrhythmia    HX A FIB - FOLLOWED BY [name]  . GERD (gastroesophageal reflux disease)   . Heart murmur    hx of years ago   . Hemorrhoids   . Hypertension   . Personal history of radiation therapy    1990/1992  . Renal cell cancer, right ([place]) 11/2014   Right Nephrectomy and Left renal ablation for lesion.   . Renal insufficiency   . Status post radiation therapy    breast cancer bilateral    Surgical History: Past Surgical History:  Procedure Laterality Date  . BREAST BIOPSY Right 03/10/2016   -  STROMAL SCLEROSIS, GIANT CELL REACTION, CHRONIC INFLAMMATION AND FAT   . BREAST EXCISIONAL BIOPSY Right 2014   +  . BREAST EXCISIONAL BIOPSY Right 1992   +  . BREAST EXCISIONAL BIOPSY Left 1990   +  . BREAST LUMPECTOMY Left 1990  . BREAST LUMPECTOMY Right 1992  . DILATION AND CURETTAGE OF UTERUS    . EYE SURGERY Bilateral    cataract extraction  . KYPHOPLASTY N/A 10/09/2014   Procedure: KYPHOPLASTY;  Surgeon: Hessie Knows, MD;  Location: [facility] ORS;  Service: Orthopedics;  Laterality: N/A;  T7 Kyphoplasty with bone biopsy  . OTHER SURGICAL HISTORY     radiation therapy breast  . ROBOT ASSISTED LAPAROSCOPIC NEPHRECTOMY  Right 02/01/2015   Procedure: ROBOTIC ASSISTED LAPAROSCOPIC RIGHT NEPHRECTOMY;  Surgeon: Cleon Gustin, MD;  Location: [facility];  Service: Urology;  Laterality: Right;    Home Medications:  Allergies as of 02/14/2019      Reactions   Morphine And Related Nausea And Vomiting   Lisinopril Cough   Tape Other (See Comments)   Blisters underneath; wide white hypofix tape post kidney biopsy       Medication List       Accurate as of February 14, 2019 11:59 PM. If you have any questions, ask your nurse or doctor.        amLODipine 5 MG tablet Commonly known as: NORVASC Take 7.5 mg by mouth every morning.    Calcium + D3 600-200 MG-UNIT Tabs Take 1 tablet by mouth daily.   docusate sodium 100 MG capsule Commonly known as: COLACE Take 100 mg by mouth at bedtime.   hydrochlorothiazide 25 MG tablet Commonly known as: HYDRODIURIL Take 50 mg by mouth daily.   losartan 100 MG tablet Commonly known as: COZAAR   metoprolol tartrate 100 MG tablet Commonly known as: LOPRESSOR Take 100 mg by mouth 2 (two) times daily.   multivitamin with minerals Tabs tablet Take 1 tablet by mouth daily.   vitamin C 1000 MG tablet Take 1,000 mg by mouth daily.       Allergies:  Allergies  Allergen Reactions  . Morphine And Related Nausea And Vomiting  . Lisinopril Cough  . Tape Other (See Comments)    Blisters underneath; wide white hypofix tape post kidney biopsy     Family History: Family History  Problem Relation Age of Onset  . Stomach cancer Mother   . Stroke Father   . Brain cancer Sister   . Breast cancer Daughter 44  . Bladder Cancer Neg Hx   . Prostate cancer Neg Hx   . Kidney cancer Neg Hx     Social History:  reports that she quit smoking about 46 years ago. Her smoking use included cigarettes. She quit after 20.00 years of use. She has never used smokeless tobacco. She reports current alcohol use of about 1.0 standard drinks of alcohol per week. She reports that she does not use drugs.  ROS: UROLOGY Frequent Urination?: No Hard to postpone urination?: No Burning/pain with urination?: No Get up at night to urinate?: No Leakage of urine?: No Urine stream starts and stops?: No Trouble starting stream?: No Do you have to strain to urinate?: No Blood in urine?: No Urinary tract infection?: No Sexually transmitted disease?: No Injury to kidneys or bladder?: No Painful intercourse?: No Weak stream?: No Currently pregnant?: No Vaginal bleeding?: No Last menstrual period?: n  Gastrointestinal Nausea?: No Vomiting?: No Indigestion/heartburn?: No Diarrhea?: No  Constipation?: No  Constitutional Fever: No Night sweats?: No Weight loss?: No Fatigue?: No  Skin Skin rash/lesions?: No Itching?: No  Eyes Blurred vision?: No Double vision?: No  Ears/Nose/Throat Sore throat?: No Sinus problems?: No  Hematologic/Lymphatic Swollen glands?: No Easy bruising?: No  Cardiovascular Leg swelling?: No Chest pain?: No  Respiratory Cough?: No Shortness of breath?: No  Endocrine Excessive thirst?: No  Musculoskeletal Back pain?: No Joint pain?: No  Neurological Headaches?: No Dizziness?: No  Psychologic Depression?: No Anxiety?: No  Physical Exam: BP (!) 201/90   Pulse (!) 52   Ht 5\' 2"  (1.575 m)   Wt 177 lb (80.3 kg)   BMI 32.37 kg/m   Constitutional:  Alert and oriented, No acute distress.  Ambulating slowly but appears younger than stated age.  Cognitively intact.  Pleasant. HEENT: [place] AT, moist mucus membranes.  Trachea midline, no masses. Cardiovascular: No clubbing, cyanosis, or edema. Respiratory: Normal respiratory effort, no increased work of breathing Skin: No rashes, bruises or suspicious lesions. Neurologic: Grossly intact, no focal deficits, moving all 4 extremities. Psychiatric: Normal mood and affect.  Laboratory Data: As above  Pertinent Imaging: Results for orders placed during the hospital encounter of 02/10/19  US RENAL   Narrative CLINICAL DATA:  History of renal cell carcinoma. Solitary kidney with ablation. Status post right nephrectomy.  EXAM: RENAL / URINARY TRACT ULTRASOUND COMPLETE  COMPARISON:  February 04, 2018 CT scan  FINDINGS: Right Kidney:  Surgically absent.  Left Kidney:  Renal measurements: 9.8 x 6.0 x 5.7 cm = volume: 174.82 mL. Contains several cysts, less than 10, with the largest measuring 2.4 cm.  Bladder:  Appears normal for degree of bladder distention.  Other:  No other abnormalities identified.  IMPRESSION: Renal cysts on the left.  Status post right  nephrectomy.   Electronically Signed   By: Dorise Bullion III M.D   On: 02/10/2019 20:03    Renal ultrasound above is reviewed, agree with radiology interpretation  Assessment & Plan:    1. History of renal cell cancer Cancer history as above NED In the setting of multi focal bilateral disease in the past as well as solitary kidney, we have continue to follow with renal ultrasound-she would like to continue doing this annually in the setting of her overall continued reasonably good health for her age Renal ultrasound 1 year - US RENAL; Future  2. Chronic renal insufficiency, stage 3 (moderate) Cr stable   Return in about 1 year (around 02/14/2020) for RUS MD or PA.  Hollice Espy, MD  [facility] [address] [place], [place] [address] [phone]

## 2019-03-06 DIAGNOSIS — I10 Essential (primary) hypertension: Secondary | ICD-10-CM | POA: Diagnosis not present

## 2019-03-06 DIAGNOSIS — N183 Chronic kidney disease, stage 3 unspecified: Secondary | ICD-10-CM | POA: Diagnosis not present

## 2019-03-06 DIAGNOSIS — E7849 Other hyperlipidemia: Secondary | ICD-10-CM | POA: Diagnosis not present

## 2019-03-06 DIAGNOSIS — I700 Atherosclerosis of aorta: Secondary | ICD-10-CM | POA: Diagnosis not present

## 2019-03-06 DIAGNOSIS — K219 Gastro-esophageal reflux disease without esophagitis: Secondary | ICD-10-CM | POA: Diagnosis not present

## 2019-03-13 DIAGNOSIS — Z853 Personal history of malignant neoplasm of breast: Secondary | ICD-10-CM | POA: Diagnosis not present

## 2019-03-13 DIAGNOSIS — Z0000 Encounter for general adult medical examination without abnormal findings: Secondary | ICD-10-CM | POA: Diagnosis not present

## 2019-03-13 DIAGNOSIS — M810 Age-related osteoporosis without current pathological fracture: Secondary | ICD-10-CM | POA: Diagnosis not present

## 2019-03-13 DIAGNOSIS — K219 Gastro-esophageal reflux disease without esophagitis: Secondary | ICD-10-CM | POA: Diagnosis not present

## 2019-03-13 DIAGNOSIS — Z85528 Personal history of other malignant neoplasm of kidney: Secondary | ICD-10-CM | POA: Diagnosis not present

## 2019-03-13 DIAGNOSIS — E7849 Other hyperlipidemia: Secondary | ICD-10-CM | POA: Diagnosis not present

## 2019-03-13 DIAGNOSIS — I700 Atherosclerosis of aorta: Secondary | ICD-10-CM | POA: Diagnosis not present

## 2019-03-13 DIAGNOSIS — I10 Essential (primary) hypertension: Secondary | ICD-10-CM | POA: Diagnosis not present

## 2019-03-13 DIAGNOSIS — I2510 Atherosclerotic heart disease of native coronary artery without angina pectoris: Secondary | ICD-10-CM | POA: Diagnosis not present

## 2019-03-13 DIAGNOSIS — N183 Chronic kidney disease, stage 3 unspecified: Secondary | ICD-10-CM | POA: Diagnosis not present

## 2019-05-24 DIAGNOSIS — H3530 Unspecified macular degeneration: Secondary | ICD-10-CM | POA: Diagnosis not present

## 2019-07-17 DIAGNOSIS — H02831 Dermatochalasis of right upper eyelid: Secondary | ICD-10-CM | POA: Diagnosis not present

## 2019-08-03 DIAGNOSIS — H02135 Senile ectropion of left lower eyelid: Secondary | ICD-10-CM | POA: Diagnosis not present

## 2019-09-04 DIAGNOSIS — E7849 Other hyperlipidemia: Secondary | ICD-10-CM | POA: Diagnosis not present

## 2019-09-04 DIAGNOSIS — N183 Chronic kidney disease, stage 3 unspecified: Secondary | ICD-10-CM | POA: Diagnosis not present

## 2019-09-04 DIAGNOSIS — I2510 Atherosclerotic heart disease of native coronary artery without angina pectoris: Secondary | ICD-10-CM | POA: Diagnosis not present

## 2019-09-04 DIAGNOSIS — I10 Essential (primary) hypertension: Secondary | ICD-10-CM | POA: Diagnosis not present

## 2019-09-04 DIAGNOSIS — I700 Atherosclerosis of aorta: Secondary | ICD-10-CM | POA: Diagnosis not present

## 2019-09-04 DIAGNOSIS — K219 Gastro-esophageal reflux disease without esophagitis: Secondary | ICD-10-CM | POA: Diagnosis not present

## 2019-09-04 DIAGNOSIS — M810 Age-related osteoporosis without current pathological fracture: Secondary | ICD-10-CM | POA: Diagnosis not present

## 2019-09-11 DIAGNOSIS — N183 Chronic kidney disease, stage 3 unspecified: Secondary | ICD-10-CM | POA: Diagnosis not present

## 2019-09-11 DIAGNOSIS — I10 Essential (primary) hypertension: Secondary | ICD-10-CM | POA: Diagnosis not present

## 2019-09-11 DIAGNOSIS — M810 Age-related osteoporosis without current pathological fracture: Secondary | ICD-10-CM | POA: Diagnosis not present

## 2019-09-11 DIAGNOSIS — E7849 Other hyperlipidemia: Secondary | ICD-10-CM | POA: Diagnosis not present

## 2019-09-11 DIAGNOSIS — Z85528 Personal history of other malignant neoplasm of kidney: Secondary | ICD-10-CM | POA: Diagnosis not present

## 2019-09-11 DIAGNOSIS — Z853 Personal history of malignant neoplasm of breast: Secondary | ICD-10-CM | POA: Diagnosis not present

## 2019-09-11 DIAGNOSIS — M791 Myalgia, unspecified site: Secondary | ICD-10-CM | POA: Diagnosis not present

## 2019-09-11 DIAGNOSIS — I2510 Atherosclerotic heart disease of native coronary artery without angina pectoris: Secondary | ICD-10-CM | POA: Diagnosis not present

## 2019-09-11 DIAGNOSIS — I700 Atherosclerosis of aorta: Secondary | ICD-10-CM | POA: Diagnosis not present

## 2019-09-11 DIAGNOSIS — K219 Gastro-esophageal reflux disease without esophagitis: Secondary | ICD-10-CM | POA: Diagnosis not present

## 2019-10-02 ENCOUNTER — Other Ambulatory Visit: Payer: Self-pay | Admitting: Internal Medicine

## 2019-10-02 DIAGNOSIS — Z1231 Encounter for screening mammogram for malignant neoplasm of breast: Secondary | ICD-10-CM

## 2019-10-26 DIAGNOSIS — I10 Essential (primary) hypertension: Secondary | ICD-10-CM | POA: Diagnosis not present

## 2019-11-03 ENCOUNTER — Encounter: Payer: Self-pay | Admitting: Ophthalmology

## 2019-11-03 ENCOUNTER — Other Ambulatory Visit: Payer: Self-pay

## 2019-11-06 ENCOUNTER — Other Ambulatory Visit: Payer: Self-pay

## 2019-11-08 ENCOUNTER — Other Ambulatory Visit
Admission: RE | Admit: 2019-11-08 | Discharge: 2019-11-08 | Disposition: A | Discharge status: Home / Self Care | Payer: PPO | Source: Ambulatory Visit | Attending: Ophthalmology | Admitting: Ophthalmology

## 2019-11-08 ENCOUNTER — Other Ambulatory Visit: Payer: Self-pay

## 2019-11-08 DIAGNOSIS — Z20822 Contact with and (suspected) exposure to covid-19: Secondary | ICD-10-CM | POA: Insufficient documentation

## 2019-11-08 DIAGNOSIS — Z01812 Encounter for preprocedural laboratory examination: Secondary | ICD-10-CM | POA: Diagnosis not present

## 2019-11-08 LAB — SARS CORONAVIRUS 2 (TAT 6-24 HRS): SARS Coronavirus 2: NEGATIVE

## 2019-11-08 NOTE — Discharge Instructions (Signed)
INSTRUCTIONS FOLLOWING OCULOPLASTIC SURGERY AMY M. FOWLER, MD  AFTER YOUR EYE SURGERY, THER ARE MANY THINGS WHICH YOU, THE PATIENT, CAN DO TO ASSURE THE BEST POSSIBLE RESULT FROM YOUR OPERATION.  THIS SHEET SHOULD BE REFERRED TO WHENEVER QUESTIONS ARISE.  IF THERE ARE ANY QUESTIONS NOT ANSWERED HERE, DO NOT HESITATE TO CALL OUR OFFICE AT [phone] OR [phone].  THERE IS ALWAYS SOMEONE AVAILABLE TO CALL IF QUESTIONS OR PROBLEMS ARISE.  VISION: Your vision may be blurred and out of focus after surgery until you are able to stop using your ointment, swelling resolves and your eye(s) heal. This may take 1 to 2 weeks at the least.  If your vision becomes gradually more dim or dark, this is not normal and you need to call our office immediately.  EYE CARE: For the first 48 hours after surgery, use ice packs frequently - "20 minutes on, 20 minutes off" - to help reduce swelling and bruising.  Small bags of frozen peas or corn make good ice packs along with cloths soaked in ice water.  If you are wearing a patch or other type of dressing following surgery, keep this on for the amount of time specified by your doctor.  For the first week following surgery, you will need to treat your stitches with great care.  It is OK to shower, but take care to not allow soapy water to run into your eye(s) to help reduce chances of infection.  You may gently clean the eyelashes and around the eye(s) with cotton balls and sterile water, BUT DO NOT RUB THE STITCHES VIGOROUSLY.  Keeping your stitches moist with ointment will help promote healing with minimal scar formation.  ACTIVITY: When you leave the surgery center, you should go home, rest and be inactive.  The eye(s) may feel scratchy and keeping the eyes closed will allow for faster healing.  The first week following surgery, avoid straining (anything making the face turn red) or lifting over 20 pounds.  Additionally, avoid bending which causes your head to go below  your waist.  Using your eyes will NOT harm them, so feel free to read, watch television, use the computer, etc as desired.  Driving depends on each individual, so check with your doctor if you have questions about driving. Do not wear contact lenses for about 2 weeks.  Do not wear eye makeup for 2 weeks.  Avoid swimming, hot tubs, gardening, and dusting for 1 to 2 weeks to reduce the risk of an infection.  MEDICATIONS:  You will be given a prescription for an ointment to use 4 times a day on your stitches.  You can use the ointment in your eyes if they feel scratchy or irritated.  If you eyelid(s) don't close completely when you sleep, put some ointment in your eyes before bedtime.  EMERGENCY: If you experience SEVERE EYE PAIN OR HEADACHE UNRELIEVED BY TYLENOL OR TRAMADOL, NAUSEA OR VOMITING, WORSENING REDNESS, OR WORSENING VISION (ESPECIALLY VISION THAT WAS INITIALLY BETTER) CALL [phone] OR [phone] DURING BUSINESS HOURS OR AFTER HOURS.  General Anesthesia, Adult, Care After This sheet gives you information about how to care for yourself after your procedure. Your health care provider may also give you more specific instructions. If you have problems or questions, contact your health care provider. What can I expect after the procedure? After the procedure, the following side effects are common:  Pain or discomfort at the IV site.  Nausea.  Vomiting.  Sore throat.  Trouble concentrating.    Feeling cold or chills.  Weak or tired.  Sleepiness and fatigue.  Soreness and body aches. These side effects can affect parts of the body that were not involved in surgery. Follow these instructions at home:  For at least 24 hours after the procedure:  Have a responsible adult stay with you. It is important to have someone help care for you until you are awake and alert.  Rest as needed.  Do not: ? Participate in activities in which you could fall or become injured. ? Drive. ? Use  heavy machinery. ? Drink alcohol. ? Take sleeping pills or medicines that cause drowsiness. ? Make important decisions or sign legal documents. ? Take care of children on your own. Eating and drinking  Follow any instructions from your health care provider about eating or drinking restrictions.  When you feel hungry, start by eating small amounts of foods that are soft and easy to digest (bland), such as toast. Gradually return to your regular diet.  Drink enough fluid to keep your urine pale yellow.  If you vomit, rehydrate by drinking water, juice, or clear broth. General instructions  If you have sleep apnea, surgery and certain medicines can increase your risk for breathing problems. Follow instructions from your health care provider about wearing your sleep device: ? Anytime you are sleeping, including during daytime naps. ? While taking prescription pain medicines, sleeping medicines, or medicines that make you drowsy.  Return to your normal activities as told by your health care provider. Ask your health care provider what activities are safe for you.  Take over-the-counter and prescription medicines only as told by your health care provider.  If you smoke, do not smoke without supervision.  Keep all follow-up visits as told by your health care provider. This is important. Contact a health care provider if:  You have nausea or vomiting that does not get better with medicine.  You cannot eat or drink without vomiting.  You have pain that does not get better with medicine.  You are unable to pass urine.  You develop a skin rash.  You have a fever.  You have redness around your IV site that gets worse. Get help right away if:  You have difficulty breathing.  You have chest pain.  You have blood in your urine or stool, or you vomit blood. Summary  After the procedure, it is common to have a sore throat or nausea. It is also common to feel tired.  Have a  responsible adult stay with you for the first 24 hours after general anesthesia. It is important to have someone help care for you until you are awake and alert.  When you feel hungry, start by eating small amounts of foods that are soft and easy to digest (bland), such as toast. Gradually return to your regular diet.  Drink enough fluid to keep your urine pale yellow.  Return to your normal activities as told by your health care provider. Ask your health care provider what activities are safe for you. This information is not intended to replace advice given to you by your health care provider. Make sure you discuss any questions you have with your health care provider. Document Revised: 04/16/2017 Document Reviewed: 11/27/2016 Elsevier Patient Education  2020 [employer]  

## 2019-11-10 ENCOUNTER — Other Ambulatory Visit: Payer: Self-pay

## 2019-11-10 ENCOUNTER — Ambulatory Visit
Admission: RE | Admit: 2019-11-10 | Discharge: 2019-11-10 | Disposition: A | Discharge status: Home / Self Care | Payer: PPO | Source: Ambulatory Visit | Attending: Ophthalmology | Admitting: Ophthalmology

## 2019-11-10 ENCOUNTER — Ambulatory Visit: Payer: PPO | Admitting: Anesthesiology

## 2019-11-10 ENCOUNTER — Encounter: Payer: Self-pay | Admitting: Ophthalmology

## 2019-11-10 ENCOUNTER — Encounter
Admission: RE | Disposition: A | Discharge status: Home / Self Care | Payer: Self-pay | Source: Ambulatory Visit | Attending: Ophthalmology

## 2019-11-10 DIAGNOSIS — H02135 Senile ectropion of left lower eyelid: Secondary | ICD-10-CM | POA: Diagnosis not present

## 2019-11-10 DIAGNOSIS — Z01810 Encounter for preprocedural cardiovascular examination: Secondary | ICD-10-CM | POA: Diagnosis not present

## 2019-11-10 DIAGNOSIS — Z905 Acquired absence of kidney: Secondary | ICD-10-CM | POA: Insufficient documentation

## 2019-11-10 DIAGNOSIS — H02005 Unspecified entropion of left lower eyelid: Secondary | ICD-10-CM | POA: Diagnosis not present

## 2019-11-10 DIAGNOSIS — I10 Essential (primary) hypertension: Secondary | ICD-10-CM | POA: Insufficient documentation

## 2019-11-10 DIAGNOSIS — Z79899 Other long term (current) drug therapy: Secondary | ICD-10-CM | POA: Diagnosis not present

## 2019-11-10 DIAGNOSIS — Z853 Personal history of malignant neoplasm of breast: Secondary | ICD-10-CM | POA: Diagnosis not present

## 2019-11-10 DIAGNOSIS — H02831 Dermatochalasis of right upper eyelid: Secondary | ICD-10-CM | POA: Diagnosis not present

## 2019-11-10 DIAGNOSIS — H02834 Dermatochalasis of left upper eyelid: Secondary | ICD-10-CM | POA: Insufficient documentation

## 2019-11-10 DIAGNOSIS — Z87891 Personal history of nicotine dependence: Secondary | ICD-10-CM | POA: Diagnosis not present

## 2019-11-10 DIAGNOSIS — Z85528 Personal history of other malignant neoplasm of kidney: Secondary | ICD-10-CM | POA: Diagnosis not present

## 2019-11-10 HISTORY — PX: BROW LIFT: SHX178

## 2019-11-10 SURGERY — BLEPHAROPLASTY
Anesthesia: Monitor Anesthesia Care | Site: Eye | Laterality: Bilateral

## 2019-11-10 MED ORDER — LACTATED RINGERS IV SOLN
INTRAVENOUS | Status: DC | PRN
Start: 2019-11-10 — End: 2019-11-10

## 2019-11-10 MED ORDER — ERYTHROMYCIN 5 MG/GM OP OINT
TOPICAL_OINTMENT | OPHTHALMIC | 2 refills | Status: DC
Start: 2019-11-10 — End: 2020-02-15

## 2019-11-10 MED ORDER — ACETAMINOPHEN 325 MG PO TABS: 325.0000 mg | ORAL_TABLET | Freq: Once | ORAL | Status: DC

## 2019-11-10 MED ORDER — LIDOCAINE HCL (CARDIAC) PF 100 MG/5ML IV SOSY
PREFILLED_SYRINGE | INTRAVENOUS | Status: DC | PRN
  Administered 2019-11-10: 40 mg via INTRAVENOUS

## 2019-11-10 MED ORDER — ACETAMINOPHEN 160 MG/5ML PO SOLN: 325.0000 mg | Freq: Once | ORAL | Status: DC

## 2019-11-10 MED ORDER — TETRACAINE HCL 0.5 % OP SOLN
OPHTHALMIC | Status: DC | PRN
  Administered 2019-11-10: 2 [drp] via OPHTHALMIC

## 2019-11-10 MED ORDER — LIDOCAINE-EPINEPHRINE 2 %-1:100000 IJ SOLN
INTRAMUSCULAR | Status: DC | PRN
  Administered 2019-11-10: 4 mL via OPHTHALMIC
  Administered 2019-11-10: 2 mL via OPHTHALMIC

## 2019-11-10 MED ORDER — BSS IO SOLN
INTRAOCULAR | Status: DC | PRN
  Administered 2019-11-10: 15 mL

## 2019-11-10 MED ORDER — PROPOFOL 500 MG/50ML IV EMUL
INTRAVENOUS | Status: DC | PRN
  Administered 2019-11-10: 25 ug/kg/min via INTRAVENOUS

## 2019-11-10 MED ORDER — MIDAZOLAM HCL 2 MG/2ML IJ SOLN
1.0000 mg | Freq: Once | INTRAMUSCULAR | Status: AC
  Administered 2019-11-10: 1 mg via INTRAVENOUS

## 2019-11-10 MED ORDER — ALFENTANIL 500 MCG/ML IJ INJ
INJECTION | INTRAVENOUS | Status: DC | PRN
  Administered 2019-11-10: 400 ug via INTRAVENOUS
  Administered 2019-11-10: 200 ug via INTRAVENOUS

## 2019-11-10 MED ORDER — ERYTHROMYCIN 5 MG/GM OP OINT
TOPICAL_OINTMENT | OPHTHALMIC | Status: DC | PRN
  Administered 2019-11-10: 1 via OPHTHALMIC

## 2019-11-10 MED ORDER — LACTATED RINGERS IV SOLN: INTRAVENOUS | Status: DC

## 2019-11-10 MED ORDER — TRAMADOL HCL 50 MG PO TABS: ORAL_TABLET | ORAL | 0 refills | Status: DC

## 2019-11-10 SURGICAL SUPPLY — 23 items
APPLICATOR COTTON TIP WD 3 STR (MISCELLANEOUS) ×2 IMPLANT
BLADE SURG 15 STRL LF DISP TIS (BLADE) ×1 IMPLANT
BLADE SURG 15 STRL SS (BLADE) ×2
CORD BIP STRL DISP 12FT (MISCELLANEOUS) ×2 IMPLANT
DRAPE HEAD BAR (DRAPES) ×2 IMPLANT
GAUZE SPONGE 4X4 12PLY STRL (GAUZE/BANDAGES/DRESSINGS) ×2 IMPLANT
GLOVE SURG LX 7.0 MICRO (GLOVE) ×2
GLOVE SURG LX STRL 7.0 MICRO (GLOVE) ×2 IMPLANT
GOWN STRL REUS W/ TWL LRG LVL3 (GOWN DISPOSABLE) ×1 IMPLANT
GOWN STRL REUS W/TWL LRG LVL3 (GOWN DISPOSABLE) ×2
MARKER SKIN XFINE TIP W/RULER (MISCELLANEOUS) ×2 IMPLANT
NEEDLE FILTER BLUNT 18X 1/2SAF (NEEDLE) ×1
NEEDLE FILTER BLUNT 18X1 1/2 (NEEDLE) ×1 IMPLANT
NEEDLE HYPO 30X.5 LL (NEEDLE) ×4 IMPLANT
PACK ENT CUSTOM (PACKS) ×2 IMPLANT
SOL PREP PVP 2OZ (MISCELLANEOUS) ×2
SOLUTION PREP PVP 2OZ (MISCELLANEOUS) ×1 IMPLANT
SPONGE GAUZE 2X2 8PLY STRL LF (GAUZE/BANDAGES/DRESSINGS) ×20 IMPLANT
SUT GUT PLAIN 6-0 1X18 ABS (SUTURE) ×4 IMPLANT
SUT MERSILENE 4-0 S-2 (SUTURE) ×2 IMPLANT
SYR 10ML LL (SYRINGE) ×2 IMPLANT
SYR 3ML LL SCALE MARK (SYRINGE) ×2 IMPLANT
WATER STERILE IRR 250ML POUR (IV SOLUTION) ×2 IMPLANT

## 2019-11-10 NOTE — Op Note (Signed)
Preoperative Diagnosis:   1.  Lower eyelid laxity with entropion, left  lower eyelid(s). 2.  Visually significant dermatochalasis both  Upper Eyelid(s)  Postoperative Diagnosis:   Same.  Procedure(s) Performed:  1.  Lateral tarsal strip procedure,  left   lower eyelid(s). 2.  Upper eyelid blepharoplasty with excess skin excision  bilateral  Upper Eyelid(s)  Surgeon: Philis Pique. Vickki Muff, M.D.  Assistants: none  Anesthesia: MAC  Specimens: None.  Estimated Blood Loss: Minimal.  Complications: None.  Operative Findings: None   Procedure:   Allergies were reviewed and the patient Lipitor [atorvastatin calcium].    After discussing the risks, benefits, complications, and alternatives with the patient, appropriate informed consent was obtained. The patient was brought to the operating suite and reclined supine. Time out was conducted and the patient was sedated.  Local anesthetic consisting of a 50-50 mixture of 2% lidocaine with epinephrine and 0.75% bupivacaine with added Hylenex was injected subcutaneously  to both  upper eyelid(s) and to the left  lateral canthal region(s) and lower eyelid(s). Additional anesthetic was injected subconjunctivally to the left  lower eyelid(s). Finally, anesthetic was injected down to the periosteum of the left  lateral orbital rim(s).  After adequate local was instilled, the patient was prepped and draped in the usual sterile fashion for eyelid surgery.   Attention was turned to the upper eyelids. A 71m upper eyelid crease incision line was marked with calipers on both  upper eyelid(s).  A pinch test was used to estimate the amount of excess skin to remove and this was marked in standard blepharoplasty style fashion. Attention was turned to the  right  upper eyelid. A #15 blade was used to open the premarked incision line. A Skin only flap was excised and hemostasis was obtained with bipolar cautery.   A buttonhole was created medially in orbicularis and  orbital septum to reveal the medial fat pocket. This was dissected free from fascial attachments, cauterized towards the pedicle base and excised to produce a nice flattening of the medial corner of the upper eyelid.  Attention was then turned to the opposite eyelid where the same procedure was performed in the same manner.  The incisions were closed with interrupted and running 6-0 plain gut suture.  Attention was turned to the left  lateral canthal angle. Westcott scissors were used to create a lateral canthotomy. Hemostasis was obtained with bipolar cautery. An inferior cantholysis was then performed with additional bipolar hemostasis. The anterior and posterior lamella of the lid were divided for approximately 8 mm.  A strip of the epithelium was excised off the superior margin of the tarsal strip and conjunctiva and retractors were incised off the inferior margin of the tarsal strip.   A double-armed 4-0 Mersilene suture was then passed each arm through the terminal portion of the tarsal strip. Each arm of the suture was then passed through the periosteum of the inner portion of the lateral orbital rim at the level of Whitnall's tubercle. The sutures were advanced and this provided nice elevation and tightening of the lower eyelid. Once the suture was secured, a thin strip of follicle-bearing skin was excised. The lateral canthal angle was reformed with an interrupted 6-0 plain gut suture. Orbicularis was reapproximated with horizontal subcuticular 6-0  plain gut sutures. The skin was closed with interrupted 6-0 plain gut sutures.   The patient tolerated the procedure well. Erythromycin ophthalmic ointment was applied to the incision site(s) followed by ice packs. The patient was taken to  the recovery area where she recovered without difficulty.  Post-Op Plan/Instructions:  The patient was instructed to use ice packs frequently for the next 48 hours. She was instructed to use Erythromycin ophthalmic  ointment on her incisions 4 times a day for the next 12 to 14 days. She was given a prescription for tramadol (or similar) for pain control should Tylenol not be effective. She was asked to to follow up at the [facility] in [place], [place] in 3-4 weeks' time or sooner as needed for problems.   Syncere Eble M. Vickki Muff, M.D. Ophthalmology

## 2019-11-10 NOTE — Transfer of Care (Signed)
Immediate Anesthesia Transfer of Care Note  Patient: Tara Ramos  Procedure(s) Performed: BLEPHAROPLASTY UPPER EYELID; W/EXCESS SKIN BILATERAL & ECTROPION REPAIR, EXTENSIVE LEFT LOWER LID (Bilateral Eye)  Patient Location: PACU  Anesthesia Type: MAC  Level of Consciousness: awake, alert  and patient cooperative  Airway and Oxygen Therapy: Patient Spontanous Breathing and Patient connected to supplemental oxygen  Post-op Assessment: Post-op Vital signs reviewed, Patient's Cardiovascular Status Stable, Respiratory Function Stable, Patent Airway and No signs of Nausea or vomiting  Post-op Vital Signs: Reviewed and stable  Complications: No complications documented.

## 2019-11-10 NOTE — Anesthesia Preprocedure Evaluation (Signed)
Anesthesia Evaluation  Patient identified by MRN, date of birth, ID band Patient awake    Reviewed: Allergy & Precautions, H&P , NPO status , Patient's Chart, lab work & pertinent test results  Airway Mallampati: II  TM Distance: >3 FB Neck ROM: full    Dental  (+) Upper Dentures, Lower Dentures   Pulmonary former smoker,    Pulmonary exam normal breath sounds clear to auscultation       Cardiovascular hypertension, Normal cardiovascular exam Rhythm:regular Rate:Normal     Neuro/Psych    GI/Hepatic GERD  ,  Endo/Other    Renal/GU Renal disease     Musculoskeletal   Abdominal   Peds  Hematology   Anesthesia Other Findings   Reproductive/Obstetrics                             Anesthesia Physical Anesthesia Plan  ASA: III  Anesthesia Plan: MAC   Post-op Pain Management:    Induction:   PONV Risk Score and Plan: 2 and Treatment may vary due to age or medical condition, TIVA, Midazolam and Ondansetron  Airway Management Planned:   Additional Equipment:   Intra-op Plan:   Post-operative Plan:   Informed Consent: I have reviewed the patients History and Physical, chart, labs and discussed the procedure including the risks, benefits and alternatives for the proposed anesthesia with the patient or authorized representative who has indicated his/her understanding and acceptance.     Dental Advisory Given  Plan Discussed with: CRNA  Anesthesia Plan Comments: (Pt presented with elevated BP this AM.  Discussed with pt who reports better control at home, which agrees with PCP reports reviewed.  Pt did not take her ACEi or Diuretic this AM, as instructed, but did take her Amlodipine and Metoprolol.  I suspect this is white coat hypertension, combined with lack of two of her regular morning meds.  We will place IV, give light sedation, and low dose antihypertensive to see if she responds.   If so, we will proceed with surgery, with pt understanding of cardiovascular risk.)        Anesthesia Quick Evaluation

## 2019-11-10 NOTE — Interval H&P Note (Signed)
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  The various methods of treatment have been discussed with the patient and family. After consideration of risks, benefits and other options for treatment, the patient has consented to  Procedure(s): BLEPHAROPLASTY UPPER EYELID; W/EXCESS SKIN BILATERAL & ECTROPION REPAIR, EXTENSIVE LEFT LOWER LID (Bilateral) as a surgical intervention.  The patient's history has been reviewed, patient examined, no change in status, stable for surgery.  I have reviewed the patient's chart and labs.  Questions were answered to the patient's satisfaction.     Vickki Muff, Phinley Schall M

## 2019-11-10 NOTE — Anesthesia Procedure Notes (Signed)
Performed by: Aemilia Dedrick, CRNA Pre-anesthesia Checklist: Patient identified, Emergency Drugs available, Suction available, Timeout performed and Patient being monitored Patient Re-evaluated:Patient Re-evaluated prior to induction Oxygen Delivery Method: Nasal cannula Placement Confirmation: positive ETCO2       

## 2019-11-10 NOTE — Anesthesia Postprocedure Evaluation (Signed)
Anesthesia Post Note  Patient: Tara Ramos  Procedure(s) Performed: BLEPHAROPLASTY UPPER EYELID; W/EXCESS SKIN BILATERAL & ECTROPION REPAIR, EXTENSIVE LEFT LOWER LID (Bilateral Eye)     Patient location during evaluation: PACU Anesthesia Type: MAC Level of consciousness: awake and alert and oriented Pain management: satisfactory to patient Vital Signs Assessment: post-procedure vital signs reviewed and stable Respiratory status: spontaneous breathing, nonlabored ventilation and respiratory function stable Cardiovascular status: blood pressure returned to baseline and stable Postop Assessment: Adequate PO intake and No signs of nausea or vomiting Anesthetic complications: no   No complications documented.  Raliegh Ip

## 2019-11-10 NOTE — H&P (Signed)
See the history and physical completed at [facility] on 10/26/19 and scanned into the chart.

## 2019-11-13 ENCOUNTER — Encounter: Payer: Self-pay | Admitting: Ophthalmology

## 2019-11-20 ENCOUNTER — Ambulatory Visit
Admission: RE | Admit: 2019-11-20 | Discharge: 2019-11-20 | Disposition: A | Discharge status: Home / Self Care | Payer: PPO | Source: Ambulatory Visit | Attending: Internal Medicine | Admitting: Internal Medicine

## 2019-11-20 DIAGNOSIS — Z1231 Encounter for screening mammogram for malignant neoplasm of breast: Secondary | ICD-10-CM

## 2019-11-28 ENCOUNTER — Other Ambulatory Visit: Payer: Self-pay | Admitting: Internal Medicine

## 2019-11-28 DIAGNOSIS — R928 Other abnormal and inconclusive findings on diagnostic imaging of breast: Secondary | ICD-10-CM

## 2019-11-28 DIAGNOSIS — N6310 Unspecified lump in the right breast, unspecified quadrant: Secondary | ICD-10-CM

## 2019-11-28 DIAGNOSIS — R921 Mammographic calcification found on diagnostic imaging of breast: Secondary | ICD-10-CM

## 2019-12-01 ENCOUNTER — Ambulatory Visit
Admission: RE | Admit: 2019-12-01 | Discharge: 2019-12-01 | Disposition: A | Discharge status: Home / Self Care | Payer: PPO | Source: Ambulatory Visit | Attending: Internal Medicine | Admitting: Internal Medicine

## 2019-12-01 ENCOUNTER — Other Ambulatory Visit: Payer: Self-pay

## 2019-12-01 DIAGNOSIS — R921 Mammographic calcification found on diagnostic imaging of breast: Secondary | ICD-10-CM | POA: Insufficient documentation

## 2019-12-01 DIAGNOSIS — N6310 Unspecified lump in the right breast, unspecified quadrant: Secondary | ICD-10-CM

## 2019-12-01 DIAGNOSIS — Z853 Personal history of malignant neoplasm of breast: Secondary | ICD-10-CM | POA: Diagnosis not present

## 2019-12-01 DIAGNOSIS — R928 Other abnormal and inconclusive findings on diagnostic imaging of breast: Secondary | ICD-10-CM

## 2019-12-07 ENCOUNTER — Other Ambulatory Visit: Payer: Self-pay | Admitting: Internal Medicine

## 2019-12-07 DIAGNOSIS — R928 Other abnormal and inconclusive findings on diagnostic imaging of breast: Secondary | ICD-10-CM

## 2019-12-07 DIAGNOSIS — N6310 Unspecified lump in the right breast, unspecified quadrant: Secondary | ICD-10-CM

## 2019-12-07 DIAGNOSIS — R921 Mammographic calcification found on diagnostic imaging of breast: Secondary | ICD-10-CM

## 2019-12-12 ENCOUNTER — Ambulatory Visit
Admission: RE | Admit: 2019-12-12 | Discharge: 2019-12-12 | Disposition: A | Discharge status: Home / Self Care | Payer: PPO | Source: Ambulatory Visit | Attending: Internal Medicine | Admitting: Internal Medicine

## 2019-12-12 ENCOUNTER — Other Ambulatory Visit: Payer: Self-pay

## 2019-12-12 DIAGNOSIS — N6310 Unspecified lump in the right breast, unspecified quadrant: Secondary | ICD-10-CM | POA: Diagnosis not present

## 2019-12-12 DIAGNOSIS — R928 Other abnormal and inconclusive findings on diagnostic imaging of breast: Secondary | ICD-10-CM | POA: Diagnosis not present

## 2019-12-12 DIAGNOSIS — R921 Mammographic calcification found on diagnostic imaging of breast: Secondary | ICD-10-CM | POA: Insufficient documentation

## 2019-12-12 DIAGNOSIS — N6489 Other specified disorders of breast: Secondary | ICD-10-CM | POA: Diagnosis not present

## 2019-12-12 DIAGNOSIS — N6311 Unspecified lump in the right breast, upper outer quadrant: Secondary | ICD-10-CM | POA: Diagnosis not present

## 2019-12-12 DIAGNOSIS — C50811 Malignant neoplasm of overlapping sites of right female breast: Secondary | ICD-10-CM | POA: Diagnosis not present

## 2019-12-12 DIAGNOSIS — Z853 Personal history of malignant neoplasm of breast: Secondary | ICD-10-CM | POA: Diagnosis not present

## 2019-12-12 DIAGNOSIS — R590 Localized enlarged lymph nodes: Secondary | ICD-10-CM | POA: Diagnosis not present

## 2019-12-12 HISTORY — PX: BREAST BIOPSY: SHX20

## 2019-12-14 NOTE — Progress Notes (Signed)
Navigation initiated.  Patient scheduled for consult with Dr. Bary Castilla 12/21/19 at 4:30.  Declines Med/Onc consult at this time.

## 2019-12-18 LAB — SURGICAL PATHOLOGY

## 2019-12-21 DIAGNOSIS — D0512 Intraductal carcinoma in situ of left breast: Secondary | ICD-10-CM | POA: Diagnosis not present

## 2019-12-21 DIAGNOSIS — C50411 Malignant neoplasm of upper-outer quadrant of right female breast: Secondary | ICD-10-CM | POA: Diagnosis not present

## 2019-12-21 DIAGNOSIS — Z171 Estrogen receptor negative status [ER-]: Secondary | ICD-10-CM | POA: Diagnosis not present

## 2019-12-27 ENCOUNTER — Other Ambulatory Visit: Payer: Self-pay | Admitting: General Surgery

## 2019-12-27 NOTE — Progress Notes (Addendum)
Subjective:     Patient ID: EMMILYNN MARUT is a 86 y.o. female.  HPI  The following portions of the patient's history were reviewed and updated as appropriate.  This an established patient is here today for: office visit. Patient was referred today by Dr. Ramonita Lab for evaluation of bilateral breast cancer. Patient reports she has had a history of a right lumpectomy as well as a left lumpectomy x 2. She states she has had radiation. Patient reports she noticed no breast changes prior to recent mammogram.   The patient had been unaware of any changes in her breast.  She has had annual screening mammograms even through the Covid epidemic.  I had followed the patient after her 2015 right breast cancer recurrence, but in 2016 she developed renal cell carcinoma and was lost to follow-up.  She had seen Nolon Stalls, MD as late as 2019.  The patient continues to manage her own home.  She reports her daughter and son-in-law live with her.  Born in [place], she moved to [place] where she lived most of her adult life.        Chief Complaint  Patient presents with  . Treatment Plan Discussion    breast cancer     BP 180/88   Pulse 73   Temp 36.2 C (97.1 F)   Ht 157.5 cm (5' 2" )   Wt 82.6 kg (182 lb)   SpO2 96%   BMI 33.29 kg/m       Past Medical History:  Diagnosis Date  . Arthritis   . Breast cancer (CMS-HCC) 1990   left breast, had radiation  . Breast cancer (CMS-HCC) 1992, 2014   right breast  . Cardiac murmur   . Chest pain    myoview borderline 2007; Evaluated by Cardiology; pt deferred cath  . CKD (chronic kidney disease) stage 3, GFR 30-59 ml/min (CMS-HCC) 04/15/2015  . DCIS (ductal carcinoma in situ)    followed by Dr Bary Castilla  . Dysrhythmia   . GERD (gastroesophageal reflux disease)   . Hemorrhoids   . History of radiation therapy   . Hyperlipidemia   . Hypertension   . Osteopenia determined by x-ray   . Renal cell  carcinoma (CMS-HCC)    Clear cell, grade 4; confined to kidney with negative margins on radical nephrectomy performed 10/16 Nicolette Bang)  . Renal insufficiency           Past Surgical History:  Procedure Laterality Date  . BREAST EXCISIONAL BIOPSY Left 1990  . BREAST EXCISIONAL BIOPSY Right 1992, 2014  . CATARACT EXTRACTION Bilateral   . dilation and curettage of uterus    . INCISIONAL BIOPSY BREAST Left 12/12/2019  . INCISIONAL BIOPSY BREAST Right 03/10/2016  . LIFT / REPAIR BROW PTOSIS FOREHEAD Bilateral 11/10/2019  . Lumpectomy x 1 on left breast  1990  . NEPHRECTOMY RADICAL Right 01/31/2015  . Status post lumpectomy x 2  1990, 1992   right  . T7 Kyphoplasty with bone biopsy  10/09/14   Dr. Rudene Christians              OB History    Gravida  5   Para  4   Term      Preterm      AB      Living        SAB      TAB      Ectopic      Molar  Multiple      Live Births          Obstetric Comments  Age at first period 89 Age of first pregnancy 49        Social History          Socioeconomic History  . Marital status: Married    Spouse name: Not on file  . Number of children: Not on file  . Years of education: Not on file  . Highest education level: Not on file  Occupational History  . Not on file  Tobacco Use  . Smoking status: Former Research scientist (life sciences)  . Smokeless tobacco: Never Used  Substance and Sexual Activity  . Alcohol use: Yes    Alcohol/week: 0.0 standard drinks    Comment: rare  . Drug use: No  . Sexual activity: Defer  Other Topics Concern  . Not on file  Social History Narrative  . Not on file   Social Determinants of Health      Financial Resource Strain:   . Difficulty of Paying Living Expenses:   Food Insecurity:   . Worried About Charity fundraiser in the Last Year:   . Arboriculturist in the Last Year:   Transportation Needs:   . Film/video editor (Medical):   Marland Kitchen Lack of Transportation  (Non-Medical):             Allergies  Allergen Reactions  . Morphine Nausea And Vomiting  . Lisinopril Cough  . Other Other (See Comments)    Tape- blisters underneath; wide white hypofix tape post kidney biopsy     Current Medications        Current Outpatient Medications  Medication Sig Dispense Refill  . amLODIPine (NORVASC) 10 MG tablet Take 1 tablet (10 mg total) by mouth once daily 90 tablet 3  . calcium carbonate-vitamin D3 (CALTRATE 600+D) 600 mg(1,528m) -200 unit tablet Take 1 tablet by mouth once daily.    . cholecalciferol (VITAMIN D3) 2,000 unit capsule Take 2,000 Units by mouth once daily.      .Marland KitchenDOCUSATE SODIUM (COLACE ORAL) Take by mouth.    . hydroCHLOROthiazide (HYDRODIURIL) 25 MG tablet TAKE ONE TABLET EVERY DAY 90 tablet 1  . losartan (COZAAR) 100 MG tablet TAKE ONE TABLET BY MOUTH EVERY DAY 90 tablet 4  . metoprolol tartrate (LOPRESSOR) 100 MG tablet TAKE 1 TABLET BY MOUTH TWICE DAILY 180 tablet 4  . multivitamin with iron-minerals (VITAMINS & MINERALS) tablet Take 1 tablet by mouth once daily.    . vit A/vit C/vit E/zinc/copper (OCUVITE PRESERVISION ORAL) Take by mouth once daily as needed    . vitamin E 400 UNIT capsule Take 400 Units by mouth once daily.     No current facility-administered medications for this visit.           Family History  Problem Relation Age of Onset  . Stomach cancer Mother   . Stroke Father   . Brain cancer Sister   . Breast cancer Daughter   . Colon cancer Neg Hx       Review of Systems  Constitutional: Negative for chills and fever.  Respiratory: Negative for cough.        Objective:   Physical Exam Constitutional:      Appearance: Normal appearance.  Cardiovascular:     Rate and Rhythm: Normal rate and regular rhythm.     Pulses: Normal pulses.     Heart sounds: Normal heart sounds.  Pulmonary:  Effort: Pulmonary effort is normal.     Breath sounds: Normal breath sounds.   Chest:    Musculoskeletal:     Cervical back: Neck supple.  Lymphadenopathy:     Upper Body:     Right upper body: No supraclavicular or axillary adenopathy.     Left upper body: No supraclavicular or axillary adenopathy.  Skin:    General: Skin is warm and dry.  Neurological:     Mental Status: She is alert and oriented to person, place, and time.  Psychiatric:        Mood and Affect: Mood normal.        Behavior: Behavior normal.     Labs and Radiology:   August 2014 right upper outer quadrant wide excision: RIGHT BREAST MASS:  - INVASIVE MAMMARY CARCINOMA, NO SPECIAL TYPE.  - DUCTAL CARCINOMA IN SITU (DCIS), NUCLEAR GRADE 2, SOLID TYPE  WITH COMEDO NECROSIS AND MICROCALCIFICATIONS.  - MARGINS OF EXCISION ARE NEGATIVE.  Marland Kitchen  CANCER CASE SUMMARY: INVASIVE CARCINOMA OF THE BREAST  PROCEDURE: Excision with wire-guided localization  LYMPH NODE SAMPLING: None submitted  SPECIMEN LATERALITY: Right  HISTOLOGIC TYPE OF INVASIVE CARCINOMA: Ductal, no special type  TUMOR SIZE: 11 mm  HISTOLOGIC GRADE: Nottingham Histologic Score  Glandular / tubular differentiation: Score 3  Nuclear pleomorphism: Score 2  Mitotic rate: Score 1  Overall grade: Grade 2  TUMOR FOCALITY: Single focus of invasive carcinoma  DUCTAL CARCINOMA IN SITU (DCIS): DCIS is present.  MARGINS:      Invasive carcinoma: Margins uninvolved by invasive  carcinoma; Distance from closest margin: 1 mm (anterior)      DCIS: Margins uninvolved by DCIS; Distance from closest  margin: <1 mm (anterior)  LYMPH NODES: None submitted  PATHOLOGIC STAGING, AJCC 7TH EDITION:  Primary tumor: pT1c  Regional lymph nodes: pNX  Distant metastasis: Not applicable  ANCILLARY STUDIES: ER/PR will be assessed by immunohistochemistry  and Her 2 FISH will be performed on block A3. Results will be  reported separately. Intradepartmental consultation was obtained.  .  .  RESULTS  [facility] (CMBP) accession #:  [id number]  Estrogen receptor (ER):    POSITIVE (>90% of cells with nuclear positivity)    Average intensity of staining: Strong  Progesterone receptor (PgR)    POSITIVE (10% of cells with nuclear positivity)    Average intensity of staining: Weak  HER2 (by in situ hybridization):    NEGATIVE (not amplified)    Number of observers: 2    Number of invasive tumor cells counted: 40    Using dual probe assay    Average number of HER2 signals per cell: 1.6    Average number of CEP17 signals per cell: 1.7    HER2/CEP17 ratio: 0.90  .  02/01/2015 renal excision: Diagnosis Kidney, radical nephrectomy for tumor, right kidney CLEAR CELL RENAL CELL CARCINOMA, WHO/ISUP NUCLEAR GRADE 4 (4 CM) THE TUMOR IS CONFINED TO THE KIDNEY NO LYMPHOVASCULAR INVASION IDENTIFIED URETER, VASCULAR AND ALL RESECTION MARGINS ARE NEGATIVE FOR TUMOR  The patient was found with a left renal lesion that underwent ablation in her solitary kidney in June 2017.  I could not locate a tissue diagnosis for this lesion prior to ablation.  03/10/2016 radiology biopsy right breast DIAGNOSIS:  A. RIGHT BREAST, 11:00, 6 CMFN; ULTRASOUND-GUIDED BIOPSY:  - STROMAL SCLEROSIS, GIANT CELL REACTION, CHRONIC INFLAMMATION AND FAT  NECROSIS.  - NEGATIVE FOR MALIGNANCY.   12/12/2019 bilateral breast biopsy completed by radiology. DIAGNOSIS:  A. BREAST MASS, RIGHT 9:00  6 CMFN; ULTRASOUND GUIDED BIOPSY:  - HIGH-GRADE CARCINOMA WITH OSSEOUS, CHONDROID, AND FOCAL RHABDOID  DIFFERENTIATION.  - SEE COMMENT.   B. LYMPH NODE, RIGHT AXILLA; ULTRASOUND-GUIDED BIOPSY:  - LYMPH NODE, NEGATIVE FOR METASTATIC CARCINOMA (ONE CORE AND ONE  FRAGMENT).  - UNREMARKABLE FIBROADIPOSE TISSUE (ONE CORE AND ONE FRAGMENT).   C. BREAST WITH CALCIFICATIONS, LEFT ANTERIOR LOWER INNER QUADRANT;  STEREOTACTIC BIOPSY:  - HIGH-GRADE DUCTAL CARCINOMA IN SITU (DCIS), COMEDO TYPE WITH  CALCIFICATION.  - DCIS PRESENT  IN 3 OF 7 BLOCKS WITH LARGEST LINEAR FOCUS MEASURING 6  MM.  - IHC FOR ESTROGEN RECEPTOR WILL BE DEFERRED TO AN EXCISION SPECIMEN.   Comment:  Sections of the right breast mass (part A) demonstrate a poorly  differentiated malignant neoplasm with chondroid, osseous, and rhabdoid  differentiation and areas of necrosis. Focal marked cytologic atypia is  present. 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  Estrogen Receptor (ER) Status: NEGATIVE (LESS THAN 1%)            Internal controls stained appropriately  A second pathologist review was performed by Dr. Allena Napoleon.   Progesterone Receptor (PgR) Status: NEGATIVE (LESS THAN 1%)            Internal controls stained appropriately   HER2 (by immunohistochemistry): NEGATIVE (Score 0)   Summary of her 1990s breast cancer as abstracted by Nolon Stalls, MD: The patienthasa history ofleftbreast cancer diagnosed in 1990s/plumpectomy and radiation. She presented with an abnormal screening mammogram in [place].Shewas then diagnosed with right breast cancer after another abnormal mammogram. She underwent right breast lumpectomy and radiation in [place].She was on tamoxifen x 5 years.  She developed recurrent disease in therightbreast in 2014. She underwent lumpectomyon 12/16/2012 by Dr. Hervey Ard. Pathology  revealed a 1.1 cm grade II invasive mammary carcinoma with DCIS. Margins were negative. Tumor was ER + (>90%), PR+ (10%), and Her2/neu negative FISH. Pathologic stage was T1cNxM0. She beganFemara in 12/2012.She is toleratingitwell.   09/18/2016 through August 2021 mammograms and ultrasounds were independently reviewed.     Assessment:     Recurrent carcinoma right breast, metaplastic.  Review of the up-to-date website shows that this tumor was not recognized before 2000, there is less than 1000 cases reported in a clinical trial and that in general it tends to have a lower rate of metastatic disease, low likelihood for hormone receptor positivity (as evidenced in this case) in response to management options as for other invasive breast cancers.  Tumors with a squamous component predominating (not here) tend to have a worse prognosis.  DCIS involving the lower inner quadrant of the left breast.  (Past history of likely DCIS in this breast based on patient description of progressive microcalcifications).    Plan:     We spent over an hour reviewing options for management, and in particular reviewing the 2018 through 2021 mammograms.  There was a very subtle change between 2019 and 2020, but really very modest and difficult even in retrospect to confirm malignancy at that  time.  We went over all the options for management.  She had previously undergone's wide excision and whole breast radiation in early 1990s.  In 2014 it was elected not to institute repeat radiation, at that time she was 24.  She had been resected to negative margins and negative margins for DCIS based on criteria at that time.  She did not undergo node dissection.  With the development of a third cancer in the right breast and in the field of previous radiation, she will be best served by mastectomy.  She is essentially a triple negative disease, but at this time is not in any way interested in considering  chemotherapy.  In the left breast with identification of a foci of DCIS she would certainly be a candidate for lumpectomy with or without adjuvant radiation.  She is concerned about the profound asymmetry that would be present, and now with the development of a fourth breast cancer between both breasts, she is looking at bilateral mastectomy.  On the left side, a left simple mastectomy would be appropriate.  On the right side, limited axillary dissection to include the previously biopsied lymph node would be undertaken.  The opportunity for second surgical opinion and medical oncology assessment was discussed.  Both are declined at this time. I reviewed the last medical oncology notes of 2019.  A 3-4 cm mass in the upper outer quadrant of the right breast had been noted back in 2017 without discernible changes.  CA 27-29 with had been normal at that time.  The patient led me to believe that there had been a difference of opinion about ongoing management and follow-up and she had expressed an opinion that she would not go back to medical oncology at [facility].  She has good support at home, and likely would do well with bilateral mastectomies even as an outpatient.    Entered by Ledell Noss, CMA, acting as a scribe for Dr. Hervey Ard, MD.   The documentation recorded by the scribe accurately reflects the service I personally performed and the decisions made by me.   Robert Bellow, MD FACS

## 2019-12-29 ENCOUNTER — Other Ambulatory Visit: Payer: Self-pay

## 2019-12-29 ENCOUNTER — Encounter
Admission: RE | Admit: 2019-12-29 | Discharge: 2019-12-29 | Disposition: A | Discharge status: Home / Self Care | Payer: PPO | Source: Ambulatory Visit | Attending: General Surgery | Admitting: General Surgery

## 2019-12-29 DIAGNOSIS — Z01812 Encounter for preprocedural laboratory examination: Secondary | ICD-10-CM | POA: Insufficient documentation

## 2019-12-29 NOTE — Patient Instructions (Signed)
Your procedure is scheduled on: 01-08-20 MONDAY Report to Same Day Surgery 2nd floor medical mall [facility] Entrance-take elevator on left to 2nd floor.  Check in with surgery information desk.) To find out your arrival time please call [phone] between 1PM - 3PM on 01-05-20 FRIDAY  Remember: Instructions that are not followed completely may result in serious medical risk, up to and including death, or upon the discretion of your surgeon and anesthesiologist your surgery may need to be rescheduled.    _x___ 1. Do not eat food after midnight the night before your procedure. NO GUM OR CANDY AFTER MIDNIGHT. You may drink clear liquids up to 2 hours before you are scheduled to arrive at the hospital for your procedure.  Do not drink clear liquids within 2 hours of your scheduled arrival to the hospital.  Clear liquids include  --Water or Apple juice without pulp  --Gatorade  --Black Coffee or Clear Tea (No milk, no creamers, do not add anything to the coffee or Tea-OK TO ADD SUGAR)     __x__ 2. No Alcohol for 24 hours before or after surgery.   __x__3. No Smoking or e-cigarettes for 24 prior to surgery.  Do not use any chewable tobacco products for at least 6 hour prior to surgery   ____  4. Bring all medications with you on the day of surgery if instructed.    __x__ 5. Notify your doctor if there is any change in your medical condition     (cold, fever, infections).    x___6. On the morning of surgery brush your teeth with toothpaste and water.  You may rinse your mouth with mouth wash if you wish.  Do not swallow any toothpaste or mouthwash.   Do not wear jewelry, make-up, hairpins, clips or nail polish.  Do not wear lotions, powders, or perfumes.   Do not shave 48 hours prior to surgery. Men may shave face and neck.  Do not bring valuables to the hospital.    [facility] is not responsible for any belongings or valuables.               Contacts, dentures or bridgework may not  be worn into surgery.  Leave your suitcase in the car. After surgery it may be brought to your room.  For patients admitted to the hospital, discharge time is determined by your treatment team.  _  Patients discharged the day of surgery will not be allowed to drive home.  You will need someone to drive you home and stay with you the night of your procedure.    Please read over the following fact sheets that you were given:   [facility] Preparing for Surgery   _x___ TAKE THE FOLLOWING MEDICATION THE MORNING OF SURGERY WITH A SMALL SIP OF WATER. These include:  1. NORVASC (AMLODIPINE)  2. METOPROLOL (TOPROL)  3.  4.  5.  6.  ____Fleets enema or Magnesium Citrate as directed.   _x___ Use CHG Soap as directed on instruction sheet   ____ Use inhalers on the day of surgery and bring to hospital day of surgery  ____ Stop Metformin and Janumet 2 days prior to surgery.    ____ Take 1/2 of usual insulin dose the night before surgery and none on the morning surgery.   ____ Follow recommendations from Cardiologist, Pulmonologist or PCP regarding  stopping Aspirin, Coumadin, Plavix ,Eliquis, Effient, or Pradaxa, and Pletal.  X____Stop Anti-inflammatories such as Advil, Aleve, Ibuprofen, Motrin, Naproxen,  Naprosyn, Goodies powders or aspirin products 7 DAYS PRIOR TO SURGERY-OK to take Tylenol OR TRAMADOL (ULTRAM) IF NEEDED   _x___ Stop supplements until after surgery-STOP PRESERVISION 7 DAYS PRIOR TO SURGERY-YOU MAY RESUME AFTER YOUR SURGERY   ____ Bring C-Pap to the hospital.

## 2020-01-04 ENCOUNTER — Other Ambulatory Visit: Payer: Self-pay

## 2020-01-04 ENCOUNTER — Other Ambulatory Visit
Admission: RE | Admit: 2020-01-04 | Discharge: 2020-01-04 | Disposition: A | Discharge status: Home / Self Care | Payer: PPO | Source: Ambulatory Visit | Attending: General Surgery | Admitting: General Surgery

## 2020-01-04 DIAGNOSIS — Z20822 Contact with and (suspected) exposure to covid-19: Secondary | ICD-10-CM | POA: Insufficient documentation

## 2020-01-04 DIAGNOSIS — Z01812 Encounter for preprocedural laboratory examination: Secondary | ICD-10-CM | POA: Insufficient documentation

## 2020-01-04 LAB — CBC
HCT: 46.3 % — ABNORMAL HIGH (ref 36.0–46.0)
Hemoglobin: 15.2 g/dL — ABNORMAL HIGH (ref 12.0–15.0)
MCH: 30.1 pg (ref 26.0–34.0)
MCHC: 32.8 g/dL (ref 30.0–36.0)
MCV: 91.7 fL (ref 80.0–100.0)
Platelets: 269 10*3/uL (ref 150–400)
RBC: 5.05 MIL/uL (ref 3.87–5.11)
RDW: 13.6 % (ref 11.5–15.5)
WBC: 6.2 10*3/uL (ref 4.0–10.5)
nRBC: 0 % (ref 0.0–0.2)

## 2020-01-04 LAB — BASIC METABOLIC PANEL
Anion gap: 9 (ref 5–15)
BUN: 22 mg/dL (ref 8–23)
CO2: 31 mmol/L (ref 22–32)
Calcium: 9.8 mg/dL (ref 8.9–10.3)
Chloride: 103 mmol/L (ref 98–111)
Creatinine, Ser: 1.17 mg/dL — ABNORMAL HIGH (ref 0.44–1.00)
GFR calc Af Amer: 49 mL/min — ABNORMAL LOW (ref >60)
GFR calc non Af Amer: 42 mL/min — ABNORMAL LOW (ref >60)
Glucose, Bld: 101 mg/dL — ABNORMAL HIGH (ref 70–99)
Potassium: 4.7 mmol/L (ref 3.5–5.1)
Sodium: 143 mmol/L (ref 135–145)

## 2020-01-05 LAB — SARS CORONAVIRUS 2 (TAT 6-24 HRS): SARS Coronavirus 2: NEGATIVE

## 2020-01-08 ENCOUNTER — Ambulatory Visit
Admission: RE | Admit: 2020-01-08 | Discharge: 2020-01-08 | Disposition: A | Discharge status: Home / Self Care | Payer: PPO | Attending: General Surgery | Admitting: General Surgery

## 2020-01-08 ENCOUNTER — Other Ambulatory Visit: Payer: Self-pay

## 2020-01-08 ENCOUNTER — Ambulatory Visit: Payer: PPO | Admitting: Urgent Care

## 2020-01-08 ENCOUNTER — Encounter: Payer: Self-pay | Admitting: General Surgery

## 2020-01-08 ENCOUNTER — Encounter
Admission: RE | Disposition: A | Discharge status: Home / Self Care | Payer: Self-pay | Source: Home / Self Care | Attending: General Surgery

## 2020-01-08 DIAGNOSIS — Z85528 Personal history of other malignant neoplasm of kidney: Secondary | ICD-10-CM | POA: Diagnosis not present

## 2020-01-08 DIAGNOSIS — I129 Hypertensive chronic kidney disease with stage 1 through stage 4 chronic kidney disease, or unspecified chronic kidney disease: Secondary | ICD-10-CM | POA: Insufficient documentation

## 2020-01-08 DIAGNOSIS — N183 Chronic kidney disease, stage 3 unspecified: Secondary | ICD-10-CM | POA: Diagnosis not present

## 2020-01-08 DIAGNOSIS — Z79899 Other long term (current) drug therapy: Secondary | ICD-10-CM | POA: Diagnosis not present

## 2020-01-08 DIAGNOSIS — C50811 Malignant neoplasm of overlapping sites of right female breast: Secondary | ICD-10-CM | POA: Diagnosis not present

## 2020-01-08 DIAGNOSIS — D0511 Intraductal carcinoma in situ of right breast: Secondary | ICD-10-CM | POA: Diagnosis not present

## 2020-01-08 DIAGNOSIS — Z923 Personal history of irradiation: Secondary | ICD-10-CM | POA: Diagnosis not present

## 2020-01-08 DIAGNOSIS — C50911 Malignant neoplasm of unspecified site of right female breast: Secondary | ICD-10-CM | POA: Diagnosis not present

## 2020-01-08 DIAGNOSIS — N189 Chronic kidney disease, unspecified: Secondary | ICD-10-CM | POA: Diagnosis not present

## 2020-01-08 DIAGNOSIS — M1990 Unspecified osteoarthritis, unspecified site: Secondary | ICD-10-CM | POA: Insufficient documentation

## 2020-01-08 DIAGNOSIS — Z87891 Personal history of nicotine dependence: Secondary | ICD-10-CM | POA: Diagnosis not present

## 2020-01-08 DIAGNOSIS — D0512 Intraductal carcinoma in situ of left breast: Secondary | ICD-10-CM | POA: Diagnosis not present

## 2020-01-08 DIAGNOSIS — C50411 Malignant neoplasm of upper-outer quadrant of right female breast: Secondary | ICD-10-CM | POA: Diagnosis not present

## 2020-01-08 DIAGNOSIS — E785 Hyperlipidemia, unspecified: Secondary | ICD-10-CM | POA: Diagnosis not present

## 2020-01-08 HISTORY — PX: MASTECTOMY MODIFIED RADICAL: SHX5962

## 2020-01-08 HISTORY — PX: SIMPLE MASTECTOMY WITH AXILLARY SENTINEL NODE BIOPSY: SHX6098

## 2020-01-08 SURGERY — MASTECTOMY, MODIFIED RADICAL
Anesthesia: General | Laterality: Right

## 2020-01-08 MED ORDER — LIDOCAINE HCL (CARDIAC) PF 100 MG/5ML IV SOSY
PREFILLED_SYRINGE | INTRAVENOUS | Status: DC | PRN
  Administered 2020-01-08: 60 mg via INTRAVENOUS

## 2020-01-08 MED ORDER — ACETAMINOPHEN 10 MG/ML IV SOLN
INTRAVENOUS | Status: DC | PRN
  Administered 2020-01-08: 1000 mg via INTRAVENOUS

## 2020-01-08 MED ORDER — PROPOFOL 10 MG/ML IV BOLUS
INTRAVENOUS | Status: AC
  Filled 2020-01-08: qty 20

## 2020-01-08 MED ORDER — LACTATED RINGERS IV SOLN: INTRAVENOUS | Status: DC

## 2020-01-08 MED ORDER — CHLORHEXIDINE GLUCONATE 0.12 % MT SOLN: 15.0000 mL | Freq: Once | OROMUCOSAL | Status: AC

## 2020-01-08 MED ORDER — LABETALOL HCL 5 MG/ML IV SOLN
INTRAVENOUS | Status: DC | PRN
  Administered 2020-01-08: 5 mg via INTRAVENOUS
  Administered 2020-01-08: 2.5 mg via INTRAVENOUS

## 2020-01-08 MED ORDER — LABETALOL HCL 5 MG/ML IV SOLN: 5.0000 mg | Freq: Once | INTRAVENOUS | Status: AC

## 2020-01-08 MED ORDER — PROPOFOL 10 MG/ML IV BOLUS
INTRAVENOUS | Status: DC | PRN
  Administered 2020-01-08: 40 mg via INTRAVENOUS
  Administered 2020-01-08: 120 mg via INTRAVENOUS

## 2020-01-08 MED ORDER — EPHEDRINE SULFATE 50 MG/ML IJ SOLN
INTRAMUSCULAR | Status: DC | PRN
  Administered 2020-01-08 (×2): 5 mg via INTRAVENOUS
  Administered 2020-01-08: 10 mg via INTRAVENOUS
  Administered 2020-01-08 (×2): 5 mg via INTRAVENOUS

## 2020-01-08 MED ORDER — METHYLENE BLUE 0.5 % INJ SOLN
INTRAVENOUS | Status: AC
  Filled 2020-01-08: qty 10

## 2020-01-08 MED ORDER — LIDOCAINE HCL (PF) 2 % IJ SOLN
INTRAMUSCULAR | Status: AC
  Filled 2020-01-08: qty 5

## 2020-01-08 MED ORDER — CEFAZOLIN SODIUM-DEXTROSE 2-4 GM/100ML-% IV SOLN
INTRAVENOUS | Status: AC
  Filled 2020-01-08: qty 100

## 2020-01-08 MED ORDER — FENTANYL CITRATE (PF) 100 MCG/2ML IJ SOLN
INTRAMUSCULAR | Status: AC
  Filled 2020-01-08: qty 2

## 2020-01-08 MED ORDER — FAMOTIDINE 20 MG PO TABS
ORAL_TABLET | ORAL | Status: AC
  Administered 2020-01-08: 20 mg via ORAL
  Filled 2020-01-08: qty 1

## 2020-01-08 MED ORDER — CHLORHEXIDINE GLUCONATE 0.12 % MT SOLN
OROMUCOSAL | Status: AC
  Administered 2020-01-08: 15 mL via OROMUCOSAL
  Filled 2020-01-08: qty 15

## 2020-01-08 MED ORDER — ONDANSETRON HCL 4 MG/2ML IJ SOLN
INTRAMUSCULAR | Status: DC | PRN
  Administered 2020-01-08: 4 mg via INTRAVENOUS

## 2020-01-08 MED ORDER — FAMOTIDINE 20 MG PO TABS: 20.0000 mg | ORAL_TABLET | Freq: Once | ORAL | Status: AC

## 2020-01-08 MED ORDER — CEFAZOLIN SODIUM-DEXTROSE 2-4 GM/100ML-% IV SOLN
2.0000 g | INTRAVENOUS | Status: AC
  Administered 2020-01-08: 2 g via INTRAVENOUS

## 2020-01-08 MED ORDER — ORAL CARE MOUTH RINSE: 15.0000 mL | Freq: Once | OROMUCOSAL | Status: AC

## 2020-01-08 MED ORDER — OXYCODONE HCL 5 MG PO TABS: 5.0000 mg | ORAL_TABLET | Freq: Once | ORAL | Status: DC | PRN

## 2020-01-08 MED ORDER — METHYLENE BLUE 0.5 % INJ SOLN
INTRAVENOUS | Status: DC | PRN
  Administered 2020-01-08: 5 mL

## 2020-01-08 MED ORDER — GLYCOPYRROLATE 0.2 MG/ML IJ SOLN
INTRAMUSCULAR | Status: AC
  Filled 2020-01-08: qty 1

## 2020-01-08 MED ORDER — HYDROCODONE-ACETAMINOPHEN 5-325 MG PO TABS: 1.0000 | ORAL_TABLET | ORAL | 0 refills | Status: DC | PRN

## 2020-01-08 MED ORDER — DEXAMETHASONE SODIUM PHOSPHATE 10 MG/ML IJ SOLN
INTRAMUSCULAR | Status: AC
  Filled 2020-01-08: qty 1

## 2020-01-08 MED ORDER — ACETAMINOPHEN 10 MG/ML IV SOLN
INTRAVENOUS | Status: AC
  Filled 2020-01-08: qty 100

## 2020-01-08 MED ORDER — LABETALOL HCL 5 MG/ML IV SOLN
INTRAVENOUS | Status: AC
  Filled 2020-01-08: qty 4

## 2020-01-08 MED ORDER — EPHEDRINE 5 MG/ML INJ
INTRAVENOUS | Status: AC
  Filled 2020-01-08: qty 10

## 2020-01-08 MED ORDER — BUPIVACAINE-EPINEPHRINE (PF) 0.5% -1:200000 IJ SOLN
INTRAMUSCULAR | Status: AC
  Filled 2020-01-08: qty 30

## 2020-01-08 MED ORDER — HYDRALAZINE HCL 20 MG/ML IJ SOLN
INTRAMUSCULAR | Status: AC
  Filled 2020-01-08: qty 1

## 2020-01-08 MED ORDER — OXYCODONE HCL 5 MG/5ML PO SOLN: 5.0000 mg | Freq: Once | ORAL | Status: DC | PRN

## 2020-01-08 MED ORDER — FENTANYL CITRATE (PF) 100 MCG/2ML IJ SOLN
INTRAMUSCULAR | Status: DC | PRN
Start: 2020-01-08 — End: 2020-01-08
  Administered 2020-01-08 (×5): 25 ug via INTRAVENOUS

## 2020-01-08 MED ORDER — FENTANYL CITRATE (PF) 100 MCG/2ML IJ SOLN: 25.0000 ug | INTRAMUSCULAR | Status: DC | PRN

## 2020-01-08 MED ORDER — ONDANSETRON HCL 4 MG/2ML IJ SOLN: 4.0000 mg | Freq: Once | INTRAMUSCULAR | Status: DC | PRN

## 2020-01-08 MED ORDER — GLYCOPYRROLATE 0.2 MG/ML IJ SOLN
INTRAMUSCULAR | Status: DC | PRN
  Administered 2020-01-08: .2 mg via INTRAVENOUS

## 2020-01-08 MED ORDER — SODIUM CHLORIDE 0.9 % IV SOLN
INTRAVENOUS | Status: DC | PRN
  Administered 2020-01-08: 15 ug/min via INTRAVENOUS

## 2020-01-08 MED ORDER — LABETALOL HCL 5 MG/ML IV SOLN
INTRAVENOUS | Status: AC
  Administered 2020-01-08: 5 mg via INTRAVENOUS
  Filled 2020-01-08: qty 4

## 2020-01-08 MED ORDER — ONDANSETRON HCL 4 MG/2ML IJ SOLN
INTRAMUSCULAR | Status: AC
  Filled 2020-01-08: qty 2

## 2020-01-08 SURGICAL SUPPLY — 60 items
APL PRP STRL LF DISP 70% ISPRP (MISCELLANEOUS) ×2
APPLIER CLIP 11 MED OPEN (CLIP)
APPLIER CLIP 13 LRG OPEN (CLIP)
APR CLP LRG 13 20 CLIP (CLIP)
APR CLP MED 11 20 MLT OPN (CLIP)
BINDER BREAST LRG (GAUZE/BANDAGES/DRESSINGS) IMPLANT
BINDER BREAST MEDIUM (GAUZE/BANDAGES/DRESSINGS) IMPLANT
BINDER BREAST XLRG (GAUZE/BANDAGES/DRESSINGS) IMPLANT
BINDER BREAST XXLRG (GAUZE/BANDAGES/DRESSINGS) IMPLANT
BLADE PHOTON ILLUMINATED (MISCELLANEOUS) IMPLANT
BLADE SURG 15 STRL SS SAFETY (BLADE) ×4 IMPLANT
BULB RESERV EVAC DRAIN JP 100C (MISCELLANEOUS) ×8 IMPLANT
CANISTER SUCT 1200ML W/VALVE (MISCELLANEOUS) ×4 IMPLANT
CHLORAPREP W/TINT 26 (MISCELLANEOUS) ×4 IMPLANT
CLIP APPLIE 11 MED OPEN (CLIP) IMPLANT
CLIP APPLIE 13 LRG OPEN (CLIP) IMPLANT
CLOSURE WOUND 1/2 X4 (GAUZE/BANDAGES/DRESSINGS) ×2
CNTNR SPEC 2.5X3XGRAD LEK (MISCELLANEOUS) ×6
CONT SPEC 4OZ STER OR WHT (MISCELLANEOUS) ×6
CONT SPEC 4OZ STRL OR WHT (MISCELLANEOUS) ×6
CONTAINER SPEC 2.5X3XGRAD LEK (MISCELLANEOUS) ×6 IMPLANT
COVER MAYO STAND REUSABLE (DRAPES) ×4 IMPLANT
COVER PROBE FLX POLY STRL (MISCELLANEOUS) ×4 IMPLANT
COVER WAND RF STERILE (DRAPES) ×4 IMPLANT
DEVICE DUBIN SPECIMEN MAMMOGRA (MISCELLANEOUS) IMPLANT
DRAIN CHANNEL JP 15F RND 16 (MISCELLANEOUS) ×8 IMPLANT
DRAPE LAPAROTOMY TRNSV 106X77 (MISCELLANEOUS) ×4 IMPLANT
DRSG GAUZE FLUFF 36X18 (GAUZE/BANDAGES/DRESSINGS) ×4 IMPLANT
DRSG TELFA 3X8 NADH (GAUZE/BANDAGES/DRESSINGS) ×4 IMPLANT
ELECT CAUTERY BLADE TIP 2.5 (TIP) ×4
ELECT REM PT RETURN 9FT ADLT (ELECTROSURGICAL) ×4
ELECTRODE CAUTERY BLDE TIP 2.5 (TIP) ×2 IMPLANT
ELECTRODE REM PT RTRN 9FT ADLT (ELECTROSURGICAL) ×2 IMPLANT
GAUZE SPONGE 4X4 12PLY STRL (GAUZE/BANDAGES/DRESSINGS) ×4 IMPLANT
GLOVE BIO SURGEON STRL SZ7.5 (GLOVE) ×24 IMPLANT
GLOVE INDICATOR 8.0 STRL GRN (GLOVE) ×24 IMPLANT
GOWN STRL REUS W/ TWL LRG LVL3 (GOWN DISPOSABLE) ×8 IMPLANT
GOWN STRL REUS W/TWL LRG LVL3 (GOWN DISPOSABLE) ×16
KIT TURNOVER KIT A (KITS) ×4 IMPLANT
LABEL OR SOLS (LABEL) ×4 IMPLANT
PACK BASIN MINOR (MISCELLANEOUS) ×4 IMPLANT
PIN SAFETY STRL (MISCELLANEOUS) ×4 IMPLANT
RETRACTOR RING XSMALL (MISCELLANEOUS) IMPLANT
RTRCTR WOUND ALEXIS 13CM XS SH (MISCELLANEOUS)
SHEARS FOC LG CVD HARMONIC 17C (MISCELLANEOUS) IMPLANT
SLEVE PROBE SENORX GAMMA FIND (MISCELLANEOUS) ×4 IMPLANT
SPONGE LAP 18X18 RF (DISPOSABLE) ×4 IMPLANT
STRIP CLOSURE SKIN 1/2X4 (GAUZE/BANDAGES/DRESSINGS) ×6 IMPLANT
SUT ETHILON 3-0 FS-10 30 BLK (SUTURE)
SUT SILK 2 0 (SUTURE) ×4
SUT SILK 2-0 30XBRD TIE 12 (SUTURE) ×2 IMPLANT
SUT VIC AB 2-0 CT1 27 (SUTURE) ×4
SUT VIC AB 2-0 CT1 TAPERPNT 27 (SUTURE) ×2 IMPLANT
SUT VIC AB 3-0 SH 27 (SUTURE) ×4
SUT VIC AB 3-0 SH 27X BRD (SUTURE) ×2 IMPLANT
SUT VICRYL+ 3-0 144IN (SUTURE) ×4 IMPLANT
SUTURE EHLN 3-0 FS-10 30 BLK (SUTURE) IMPLANT
SWABSTK COMLB BENZOIN TINCTURE (MISCELLANEOUS) ×4 IMPLANT
TAPE TRANSPORE STRL 2 31045 (GAUZE/BANDAGES/DRESSINGS) ×4 IMPLANT
TOWEL OR 17X26 4PK STRL BLUE (TOWEL DISPOSABLE) ×4 IMPLANT

## 2020-01-08 NOTE — Discharge Instructions (Signed)
Surgical Drain Home Care Surgical drains are used to remove extra fluid that normally builds up in a surgical wound after surgery. A surgical drain helps to heal a surgical wound. Different kinds of surgical drains include:  Active drains. These drains use suction to pull drainage away from the surgical wound. Drainage flows through a tube to a container outside of the body. With these drains, you need to keep the bulb or the drainage container flat (compressed) at all times, except while you empty it. Flattening the bulb or container creates suction.  Passive drains. These drains allow fluid to drain naturally, by gravity. Drainage flows through a tube to a bandage (dressing) or a container outside of the body. Passive drains do not need to be emptied. A drain is placed during surgery. Right after surgery, drainage is usually bright red and a little thicker than water. The drainage may gradually turn yellow or pink and become thinner. It is likely that your health care provider will remove the drain when the drainage stops or when the amount decreases to 1-2 Tbsp (15-30 mL) during a 24-hour period. Supplies needed:  Tape.  Germ-free cleaning solution (sterile saline).  Cotton swabs.  Split gauze drain sponge: 4 x 4 inches (10 x 10 cm).  Gauze square: 4 x 4 inches (10 x 10 cm). How to care for your surgical drain Care for your drain as told by your health care provider. This is important to help prevent infection. If your drain is placed at your back, or any other hard-to-reach area, ask another person to assist you in performing the following tasks: General care  Keep the skin around the drain dry and covered with a dressing at all times.  Check your drain area every day for signs of infection. Check for: ? Redness, swelling, or pain. ? Pus or a bad smell. ? Cloudy drainage. ? Tenderness or pressure at the drain exit site. Changing the dressing Follow instructions from your health care  provider about how to change your dressing. Change your dressing at least once a day. Change it more often if needed to keep the dressing dry. Make sure you: 1. Gather your supplies. 2. Wash your hands with soap and water before you change your dressing. If soap and water are not available, use hand sanitizer. 3. Remove the old dressing. Avoid using scissors to do that. 4. Wash your hands with soap and water again after removing the old dressing. 5. Use sterile saline to clean your skin around the drain. You may need to use a cotton swab to clean the skin. 6. Place the tube through the slit in a drain sponge. Place the drain sponge so that it covers your wound. 7. Place the gauze square or another drain sponge on top of the drain sponge that is on the wound. Make sure the tube is between those layers. 8. Tape the dressing to your skin. 9. Tape the drainage tube to your skin 1-2 inches (2.5-5 cm) below the place where the tube enters your body. Taping keeps the tube from pulling on any stitches (sutures) that you have. 10. Wash your hands with soap and water. 11. Write down the color of your drainage and how often you change your dressing. How to empty your active drain  1. Make sure that you have a measuring cup that you can empty your drainage into. 2. Wash your hands with soap and water. If soap and water are not available, use hand sanitizer. 3.   Loosen any pins or clips that hold the tube in place. 4. If your health care provider tells you to strip the tube to prevent clots and tube blockages: ? Hold the tube at the skin with one hand. Use your other hand to pinch the tubing with your thumb and first finger. ? Gently move your fingers down the tube while squeezing very lightly. This clears any drainage, clots, or tissue from the tube. ? You may need to do this several times each day to keep the tube clear. Do not pull on the tube. 5. Open the bulb cap or the drain plug. Do not touch the  inside of the cap or the bottom of the plug. 6. Turn the device upside down and gently squeeze. 7. Empty all of the drainage into the measuring cup. 8. Compress the bulb or the container and replace the cap or the plug. To compress the bulb or the container, squeeze it firmly in the middle while you close the cap or plug the container. 9. Write down the amount of drainage that you have in each 24-hour period. If you have less than 2 Tbsp (30 mL) of drainage during 24 hours, contact your health care provider. 10. Flush the drainage down the toilet. 11. Wash your hands with soap and water. Contact a health care provider if:  You have redness, swelling, or pain around your drain area.  You have pus or a bad smell coming from your drain area.  You have a fever or chills.  The skin around your drain is warm to the touch.  The amount of drainage that you have is increasing instead of decreasing.  You have drainage that is cloudy.  There is a sudden stop or a sudden decrease in the amount of drainage that you have.  Your drain tube falls out.  Your active drain does not stay compressed after you empty it. Summary  Surgical drains are used to remove extra fluid that normally builds up in a surgical wound after surgery.  Different kinds of surgical drains include active drains and passive drains. Active drains use suction to pull drainage away from the surgical wound, and passive drains allow fluid to drain naturally.  It is important to care for your drain to prevent infection. If your drain is placed at your back, or any other hard-to-reach area, ask another person to assist you.  Contact your health care provider if you have redness, swelling, or pain around your drain area. This information is not intended to replace advice given to you by your health care provider. Make sure you discuss any questions you have with your health care provider. Document Revised: 05/18/2018 Document  Reviewed: 05/18/2018 Elsevier Patient Education  2020 [employer]    Surgical Drain Record Empty your surgical drain as told by your health care provider. Use this form to write down the amount of fluid that has collected in the drainage container. Bring this form with you to your follow-up visits. Surgical drain #1 location: ___________________  Date __________ Time __________ Amount __________ Date __________ Time __________ Amount __________ Date __________ Time __________ Amount __________ Date __________ Time __________ Amount __________ Date __________ Time __________ Amount __________ Date __________ Time __________ Amount __________ Date __________ Time __________ Amount __________ Date __________ Time __________ Amount __________ Date __________ Time __________ Amount __________ Date __________ Time __________ Amount __________ Date __________ Time __________ Amount __________ Date __________ Time __________ Amount __________ Date __________ Time __________ Amount __________ Date __________   Time __________ Amount __________ Date __________ Time __________ Amount __________ Date __________ Time __________ Amount __________ Date __________ Time __________ Amount __________ Date __________ Time __________ Amount __________ Date __________ Time __________ Amount __________ Date __________ Time __________ Amount __________ Date __________ Time __________ Amount __________ Surgical drain #2 location: ___________________ Date __________ Time __________ Amount __________ Date __________ Time __________ Amount __________ Date __________ Time __________ Amount __________ Date __________ Time __________ Amount __________ Date __________ Time __________ Amount __________ Date __________ Time __________ Amount __________ Date __________ Time __________ Amount __________ Date __________ Time __________ Amount __________ Date __________ Time __________ Amount __________ Date  __________ Time __________ Amount __________ Date __________ Time __________ Amount __________ Date __________ Time __________ Amount __________ Date __________ Time __________ Amount __________ Date __________ Time __________ Amount __________ Date __________ Time __________ Amount __________ Date __________ Time __________ Amount __________ Date __________ Time __________ Amount __________ Date __________ Time __________ Amount __________ Date __________ Time __________ Amount __________ Date __________ Time __________ Amount __________ Date __________ Time __________ Amount __________ This information is not intended to replace advice given to you by your health care provider. Make sure you discuss any questions you have with your health care provider. Document Revised: 01/18/2017 Document Reviewed: 01/18/2017 Elsevier Patient Education  2020 [employer]     AMBULATORY SURGERY  DISCHARGE INSTRUCTIONS   1) The drugs that you were given will stay in your system until tomorrow so for the next 24 hours you should not:  A) Drive an automobile B) Make any legal decisions C) Drink any alcoholic beverage   2) You may resume regular meals tomorrow.  Today it is better to start with liquids and gradually work up to solid foods.  You may eat anything you prefer, but it is better to start with liquids, then soup and crackers, and gradually work up to solid foods.   3) Please notify your doctor immediately if you have any unusual bleeding, trouble breathing, redness and pain at the surgery site, drainage, fever, or pain not relieved by medication.    4) Additional Instructions:        Please contact your physician with any problems or Same Day Surgery at [phone], Monday through Friday 6 am to 4 pm, or [facility] Main number at [phone]. 

## 2020-01-08 NOTE — Op Note (Addendum)
Preoperative diagnosis: 1) recurrent right breast cancer, metaplastic; 2) recurrent left breast cancer.  Postoperative diagnosis: Same.  Operative procedure: Right modified radical mastectomy, left simple mastectomy.  Operating surgeon: Hervey Ard, MD.  Assistant: Arvilla Meres, RNFA  Anesthesia: General by LMA  Estimated blood loss: Less than 100 cc.  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  She desired bilateral mastectomy.  Due to concern for axillary disease it was planned to proceed to a low axillary dissection.  She was injected with 5 cc of methylene blue, 0.5%, on induction of anesthesia.  She did receive Ancef intravenously for antibiotic prophylaxis.  SCD stockings for DVT prevention.  Operative note: With the patient under adequate general anesthesia the breast, chest and neck was cleansed with ChloraPrep and draped.  An elliptical incision was outlined on each breast.  The skin was incised sharply on the right side and then remaining dissection completed with electrocautery.  Limits of dissection were the sternum medially, clavicle superiorly, serratus muscle laterally and the rectus fascia inferiorly.  The breast was elevated off the underlying pectoralis muscle with the fascia of that muscle taken with the specimen.  There was some hard nodes about 8-10 mm in diameter in the axilla.  The axillary contents were swept inferiorly.  The long thoracic nerve of Bell and thoracodorsal nerve artery and vein bundle were identified.  The axillary artery was identified by palpation and it was elected to stay well below the axillary vein in the area of previous radiation.  Once the breast was handed off and sent to pathology fresh per protocol a 15 [place] Blake drain was brought out through the inferior medial flap.  This was anchored in place with a 3-0 nylon  suture.  The wound was irrigated with sterile water.  The flaps were approximated with a running 2-0 Vicryl deep dermal suture.  While the wound was being closed on the right side attention was turned towards the left.  Once again elliptical incision was used with the same borders as noted above.  The breast was elevated off the underlying muscle the axillary contents were left undisturbed.  This was sent fresh per protocol.  The wound was irrigated with sterile water.  A Blake drain was brought out to the inferior medial flap.  The wound was closed in 2 segments with a running 2-0 Vicryl deep dermal suture.  Benzoin Steri-Strips followed by Telfa, fluff gauze and a compressive wrap were applied.  The patient tolerated the procedure well and was taken to recovery in stable condition.  The patient's blood pressure has been quite high on presentation to day surgery.  In discussion with anesthesia and the patient was elected to proceed not anticipating any improvement with manipulation of her for recurrent medications and the increased anxiety of further delay.

## 2020-01-08 NOTE — Anesthesia Procedure Notes (Signed)
Procedure Name: LMA Insertion Date/Time: 01/08/2020 11:34 AM Performed by: Jerrye Noble, CRNA Pre-anesthesia Checklist: Patient identified, Emergency Drugs available, Suction available and Patient being monitored Patient Re-evaluated:Patient Re-evaluated prior to induction Oxygen Delivery Method: Circle system utilized Preoxygenation: Pre-oxygenation with 100% oxygen Induction Type: IV induction Ventilation: Mask ventilation without difficulty LMA: LMA inserted LMA Size: 3.5 Grade View: Grade I Number of attempts: 1 Placement Confirmation: positive ETCO2 and breath sounds checked- equal and bilateral Tube secured with: Tape Dental Injury: Teeth and Oropharynx as per pre-operative assessment

## 2020-01-08 NOTE — H&P (Signed)
Tara Ramos [id number] 1931-08-04     HPI:   86 y.o with long history of hypertension with recently diagnosed bilateral breast cancer (prior right breast cancer x 2, left breast cancer x 1.)  Presented with elevated BP.   Discussed with anesthesia.  Patient presently on 4 antihypertensives, unlikely to get significantly better if surgery delayed.   Medications Prior to Admission  Medication Sig Dispense Refill Last Dose  . amLODipine (NORVASC) 5 MG tablet Take 7.5 mg by mouth every morning.   01/08/2020 at Unknown time  . Calcium Carb-Cholecalciferol (CALCIUM + D3) 600-200 MG-UNIT TABS Take 1 tablet by mouth daily.    Past Week at Unknown time  . docusate sodium (COLACE) 100 MG capsule Take 100 mg by mouth at bedtime.   Past Week at Unknown time  . hydrochlorothiazide (HYDRODIURIL) 25 MG tablet Take 50 mg by mouth daily.   Past Week at Unknown time  . losartan (COZAAR) 100 MG tablet Take 100 mg by mouth every morning.    01/07/2020 at Unknown time  . metoprolol (LOPRESSOR) 100 MG tablet Take 100 mg by mouth 2 (two) times daily.   01/08/2020 at 0930  . Multiple Vitamin (MULTIVITAMIN WITH MINERALS) TABS tablet Take 1 tablet by mouth daily.   Past Week at Unknown time  . Multiple Vitamins-Minerals (PRESERVISION AREDS 2 PO) Take 1 capsule by mouth in the morning and at bedtime.   Past Week at Unknown time  . traMADol (ULTRAM) 50 MG tablet Take 1 every 4-6 hours as needed for pain not controlled by Tylenol (Patient taking differently: Take 50 mg by mouth every 6 (six) hours as needed for moderate pain. ) 6 tablet 0 Past Week at Unknown time  . Ascorbic Acid (VITAMIN C) 1000 MG tablet Take 1,000 mg by mouth daily. (Patient not taking: Reported on 12/25/2019)   Not Taking at Unknown time  . erythromycin ophthalmic ointment Apply to sutures 4 times a day for 10-12 days.  Discontinue if allergy develops and call our office (Patient not taking: Reported on 12/25/2019) 3.5 g 2 Not Taking at Unknown time    Allergies  Allergen Reactions  . Morphine And Related Nausea And Vomiting  . Lisinopril Cough  . Tape Other (See Comments)    Blisters underneath; wide white hypofix tape post kidney biopsy    Past Medical History:  Diagnosis Date  . Arthritis   . Breast cancer ([place]) 1992   right breast/radiation  . Breast cancer ([place]) 1990   left breast/radiation  . Breast cancer ([place]) 2014   right breast  . Breast cancer, right breast ([place]) 12/16/2012   left, then right, then right breast cancers (lumpectomies and radiation therapy)  . Chronic kidney disease   . Dysrhythmia    HX A FIB - FOLLOWED BY [name]  . GERD (gastroesophageal reflux disease)   . Heart murmur    hx of years ago   . Hemorrhoids   . Hypertension   . Personal history of radiation therapy    1990/1992  . Renal cell cancer, right ([place]) 11/2014   Right Nephrectomy and Left renal ablation for lesion.   . Renal insufficiency   . Status post radiation therapy    breast cancer bilateral   Past Surgical History:  Procedure Laterality Date  . BREAST BIOPSY Right 03/10/2016   -  STROMAL SCLEROSIS, GIANT CELL REACTION, CHRONIC INFLAMMATION AND FAT   . BREAST BIOPSY Right 12/12/2019   [place] bx of mass, vision marker, path  pending  . BREAST BIOPSY Right 12/12/2019   [place] bx of LN, hydromarker, path pending  . BREAST BIOPSY Left 12/12/2019   stereo bx of calcs, coil marker, path pending  . BREAST EXCISIONAL BIOPSY Right 2014   +  . BREAST EXCISIONAL BIOPSY Right 1992   +  . BREAST EXCISIONAL BIOPSY Left 1990   +  . BREAST LUMPECTOMY Left 1990  . BREAST LUMPECTOMY Right 1992  . BROW LIFT Bilateral 11/10/2019   Procedure: BLEPHAROPLASTY UPPER EYELID; W/EXCESS SKIN BILATERAL & ECTROPION REPAIR, EXTENSIVE LEFT LOWER LID;  Surgeon: Karle Starch, MD;  Location: [place] [facility];  Service: Ophthalmology;  Laterality: Bilateral;  . DILATION AND CURETTAGE OF UTERUS    . EYE SURGERY Bilateral    cataract extraction  .  KYPHOPLASTY N/A 10/09/2014   Procedure: KYPHOPLASTY;  Surgeon: Hessie Knows, MD;  Location: [facility];  Service: Orthopedics;  Laterality: N/A;  T7 Kyphoplasty with bone biopsy  . OTHER SURGICAL HISTORY     radiation therapy breast  . ROBOT ASSISTED LAPAROSCOPIC NEPHRECTOMY Right 02/01/2015   Procedure: ROBOTIC ASSISTED LAPAROSCOPIC RIGHT NEPHRECTOMY;  Surgeon: Cleon Gustin, MD;  Location: [facility];  Service: Urology;  Laterality: Right;   Social History   Socioeconomic History  . Marital status: Married    Spouse name: Not on file  . Number of children: Not on file  . Years of education: Not on file  . Highest education level: Not on file  Occupational History  . Not on file  Tobacco Use  . Smoking status: Former Smoker    Years: 20.00    Types: Cigarettes    Quit date: 04/27/1972    Years since quitting: 47.7  . Smokeless tobacco: Never Used  Substance and Sexual Activity  . Alcohol use: Yes    Alcohol/week: 1.0 standard drink    Types: 1 Glasses of wine per week    Comment: 1 glass of wine with dinner occ  . Drug use: No  . Sexual activity: Never  Other Topics Concern  . Not on file  Social History Narrative  . Not on file   Social Determinants of Health   Financial Resource Strain:   . Difficulty of Paying Living Expenses: Not on file  Food Insecurity:   . Worried About Charity fundraiser in the Last Year: Not on file  . Ran Out of Food in the Last Year: Not on file  Transportation Needs:   . Lack of Transportation (Medical): Not on file  . Lack of Transportation (Non-Medical): Not on file  Physical Activity:   . Days of Exercise per Week: Not on file  . Minutes of Exercise per Session: Not on file  Stress:   . Feeling of Stress : Not on file  Social Connections:   . Frequency of Communication with Friends and Family: Not on file  . Frequency of Social Gatherings with Friends and Family: Not on file  . Attends Religious Services: Not on file  . Active Member  of Clubs or Organizations: Not on file  . Attends Archivist Meetings: Not on file  . Marital Status: Not on file  Intimate Partner Violence:   . Fear of Current or Ex-Partner: Not on file  . Emotionally Abused: Not on file  . Physically Abused: Not on file  . Sexually Abused: Not on file   Social History   Social History Narrative  . Not on file     ROS: Negative.  PE: HEENT: Negative. Lungs: Clear. Cardio: RR.    Assessment/Plan:  Proceed with planned bilateral mastectomy and right axillary dissection.   Forest Gleason [facility] 01/08/2020

## 2020-01-08 NOTE — Anesthesia Preprocedure Evaluation (Addendum)
Anesthesia Evaluation  Patient identified by MRN, date of birth, ID band Patient awake    Reviewed: Allergy & Precautions, H&P , NPO status , Patient's Chart, lab work & pertinent test results  History of Anesthesia Complications Negative for: history of anesthetic complications  Airway Mallampati: II  TM Distance: >3 FB     Dental   Pulmonary neg sleep apnea, neg COPD, former smoker,    breath sounds clear to auscultation       Cardiovascular hypertension, (-) angina(-) Past MI and (-) Cardiac Stents + dysrhythmias Atrial Fibrillation  Rhythm:regular Rate:Normal  Poorly controlled HTN on 3-4 antihypertensives (does not take HCTZ consistently)   Neuro/Psych negative neurological ROS  negative psych ROS   GI/Hepatic Neg liver ROS, GERD  Controlled and Medicated,  Endo/Other  negative endocrine ROS  Renal/GU Renal diseaseCKD S/p right nephrectomy     Musculoskeletal   Abdominal   Peds  Hematology negative hematology ROS (+)   Anesthesia Other Findings Past Medical History: No date: Arthritis 1992: Breast cancer ([place])     Comment:  right breast/radiation 1990: Breast cancer ([place])     Comment:  left breast/radiation 2014: Breast cancer ([place])     Comment:  right breast 12/16/2012: Breast cancer, right breast ([place])     Comment:  left, then right, then right breast cancers               (lumpectomies and radiation therapy) No date: Chronic kidney disease No date: Dysrhythmia     Comment:  HX A FIB - FOLLOWED BY [name] No date: GERD (gastroesophageal reflux disease) No date: Heart murmur     Comment:  hx of years ago  Pt hypertensive in pre-op.  States that she is always hypertensive despite compliance with anti-hypertensive medications.  Reports home SBP usually 160-180.  SBP reportedly over 200 at recent cataract appointment. Denies H/A or CP.   No date: Hemorrhoids No date: Hypertension No date: Personal  history of radiation therapy     Comment:  1990/1992 11/2014: Renal cell cancer, right [facility])     Comment:  Right Nephrectomy and Left renal ablation for lesion.  No date: Renal insufficiency No date: Status post radiation therapy     Comment:  breast cancer bilateral  Past Surgical History: 03/10/2016: BREAST BIOPSY; Right     Comment:  -  STROMAL SCLEROSIS, GIANT CELL REACTION, CHRONIC               INFLAMMATION AND FAT  12/12/2019: BREAST BIOPSY; Right     Comment:  [place] bx of mass, vision marker, path pending 12/12/2019: BREAST BIOPSY; Right     Comment:  [place] bx of LN, hydromarker, path pending 12/12/2019: BREAST BIOPSY; Left     Comment:  stereo bx of calcs, coil marker, path pending 2014: BREAST EXCISIONAL BIOPSY; Right     Comment:  + 1992: BREAST EXCISIONAL BIOPSY; Right     Comment:  + 1990: BREAST EXCISIONAL BIOPSY; Left     Comment:  + 1990: BREAST LUMPECTOMY; Left 1992: BREAST LUMPECTOMY; Right 11/10/2019: BROW LIFT; Bilateral     Comment:  Procedure: BLEPHAROPLASTY UPPER EYELID; W/EXCESS SKIN               BILATERAL & ECTROPION REPAIR, EXTENSIVE LEFT LOWER LID;                Surgeon: Karle Starch, MD;  Location: [place]  [facility];  Service: Ophthalmology;  Laterality: Bilateral; No date: DILATION AND CURETTAGE OF UTERUS No date: EYE SURGERY; Bilateral     Comment:  cataract extraction 10/09/2014: KYPHOPLASTY; N/A     Comment:  Procedure: KYPHOPLASTY;  Surgeon: Hessie Knows, MD;                Location: [facility];  Service: Orthopedics;  Laterality:               N/A;  T7 Kyphoplasty with bone biopsy No date: OTHER SURGICAL HISTORY     Comment:  radiation therapy breast 02/01/2015: ROBOT ASSISTED LAPAROSCOPIC NEPHRECTOMY; Right     Comment:  Procedure: ROBOTIC ASSISTED LAPAROSCOPIC RIGHT               NEPHRECTOMY;  Surgeon: Cleon Gustin, MD;  Location:              [facility];  Service: Urology;  Laterality: Right;      Reproductive/Obstetrics negative OB ROS                            Anesthesia Physical Anesthesia Plan  ASA: III  Anesthesia Plan: General LMA   Post-op Pain Management:    Induction:   PONV Risk Score and Plan: Dexamethasone, Ondansetron and Treatment may vary due to age or medical condition  Airway Management Planned:   Additional Equipment:   Intra-op Plan:   Post-operative Plan:   Informed Consent: I have reviewed the patients History and Physical, chart, labs and discussed the procedure including the risks, benefits and alternatives for the proposed anesthesia with the patient or authorized representative who has indicated his/her understanding and acceptance.     Dental Advisory Given  Plan Discussed with: Anesthesiologist, CRNA and Surgeon  Anesthesia Plan Comments: (Discussed with patient and surgeon the increased risks of cardiovascular events under GA given the pt's poorly controlled HTN.  Patient and surgeon are in agreement that the benefits of procedure are felt to outweigh the risks at this time.  Informed consent obtained.  Decision made to proceed.  KR  )       Anesthesia Quick Evaluation

## 2020-01-08 NOTE — Transfer of Care (Signed)
Immediate Anesthesia Transfer of Care Note  Patient: [name]  Procedure(s) Performed: MASTECTOMY MODIFIED RADICAL (Right ) SIMPLE MASTECTOMY (Left )  Patient Location: PACU  Anesthesia Type:General  Level of Consciousness: awake and drowsy  Airway & Oxygen Therapy: Patient Spontanous Breathing and Patient connected to face mask oxygen  Post-op Assessment: Report given to RN and Post -op Vital signs reviewed and stable  Post vital signs: Reviewed and stable  Last Vitals:  Vitals Value Taken Time  BP 129/64 01/08/20 1403  Temp    Pulse 70 01/08/20 1408  Resp 22 01/08/20 1408  SpO2 100 % 01/08/20 1408  Vitals shown include unvalidated device data.  Last Pain:  Vitals:   01/08/20 1040  TempSrc: Temporal  PainSc: 0-No pain         Complications: No complications documented.

## 2020-01-09 ENCOUNTER — Encounter: Payer: Self-pay | Admitting: General Surgery

## 2020-01-09 NOTE — Anesthesia Postprocedure Evaluation (Signed)
Anesthesia Post Note  Patient: Tara Ramos  Procedure(s) Performed: MASTECTOMY MODIFIED RADICAL (Right ) SIMPLE MASTECTOMY (Left )  Patient location during evaluation: PACU Anesthesia Type: General Level of consciousness: awake and alert Pain management: pain level controlled Vital Signs Assessment: post-procedure vital signs reviewed and stable Respiratory status: spontaneous breathing, nonlabored ventilation, respiratory function stable and patient connected to nasal cannula oxygen Cardiovascular status: blood pressure returned to baseline and stable Postop Assessment: no apparent nausea or vomiting Anesthetic complications: no   No complications documented.   Last Vitals:  Vitals:   01/08/20 1714 01/08/20 1736  BP: (!) 143/72 139/70  Pulse: (!) 55 71  Resp: 16 16  Temp: (!) 36 C   SpO2: 95% 97%    Last Pain:  Vitals:   01/09/20 0836  TempSrc:   PainSc: 0-No pain                 Arita Miss

## 2020-01-10 ENCOUNTER — Other Ambulatory Visit: Payer: Self-pay | Admitting: Anatomic Pathology & Clinical Pathology

## 2020-01-10 LAB — SURGICAL PATHOLOGY

## 2020-01-19 DIAGNOSIS — H903 Sensorineural hearing loss, bilateral: Secondary | ICD-10-CM | POA: Diagnosis not present

## 2020-01-25 DIAGNOSIS — H903 Sensorineural hearing loss, bilateral: Secondary | ICD-10-CM | POA: Diagnosis not present

## 2020-02-05 ENCOUNTER — Other Ambulatory Visit: Payer: Self-pay

## 2020-02-05 ENCOUNTER — Ambulatory Visit
Admission: RE | Admit: 2020-02-05 | Discharge: 2020-02-05 | Disposition: A | Discharge status: Home / Self Care | Payer: PPO | Source: Ambulatory Visit | Attending: Urology | Admitting: Urology

## 2020-02-05 DIAGNOSIS — Z85528 Personal history of other malignant neoplasm of kidney: Secondary | ICD-10-CM | POA: Insufficient documentation

## 2020-02-05 DIAGNOSIS — N281 Cyst of kidney, acquired: Secondary | ICD-10-CM | POA: Diagnosis not present

## 2020-02-05 DIAGNOSIS — Z905 Acquired absence of kidney: Secondary | ICD-10-CM | POA: Diagnosis not present

## 2020-02-07 IMAGING — CR DG CHEST 2V
1 series · 2 of 2 positions shown · non-contrast
Comparison: 02/12/2017

CLINICAL DATA: Renal cell carcinoma.  No chest complaints

EXAM:
CHEST - 2 VIEW

[Series 1: dg chest 2 view · 0.14mm/px · 2 of 2 slices shown]
[im 1/2]
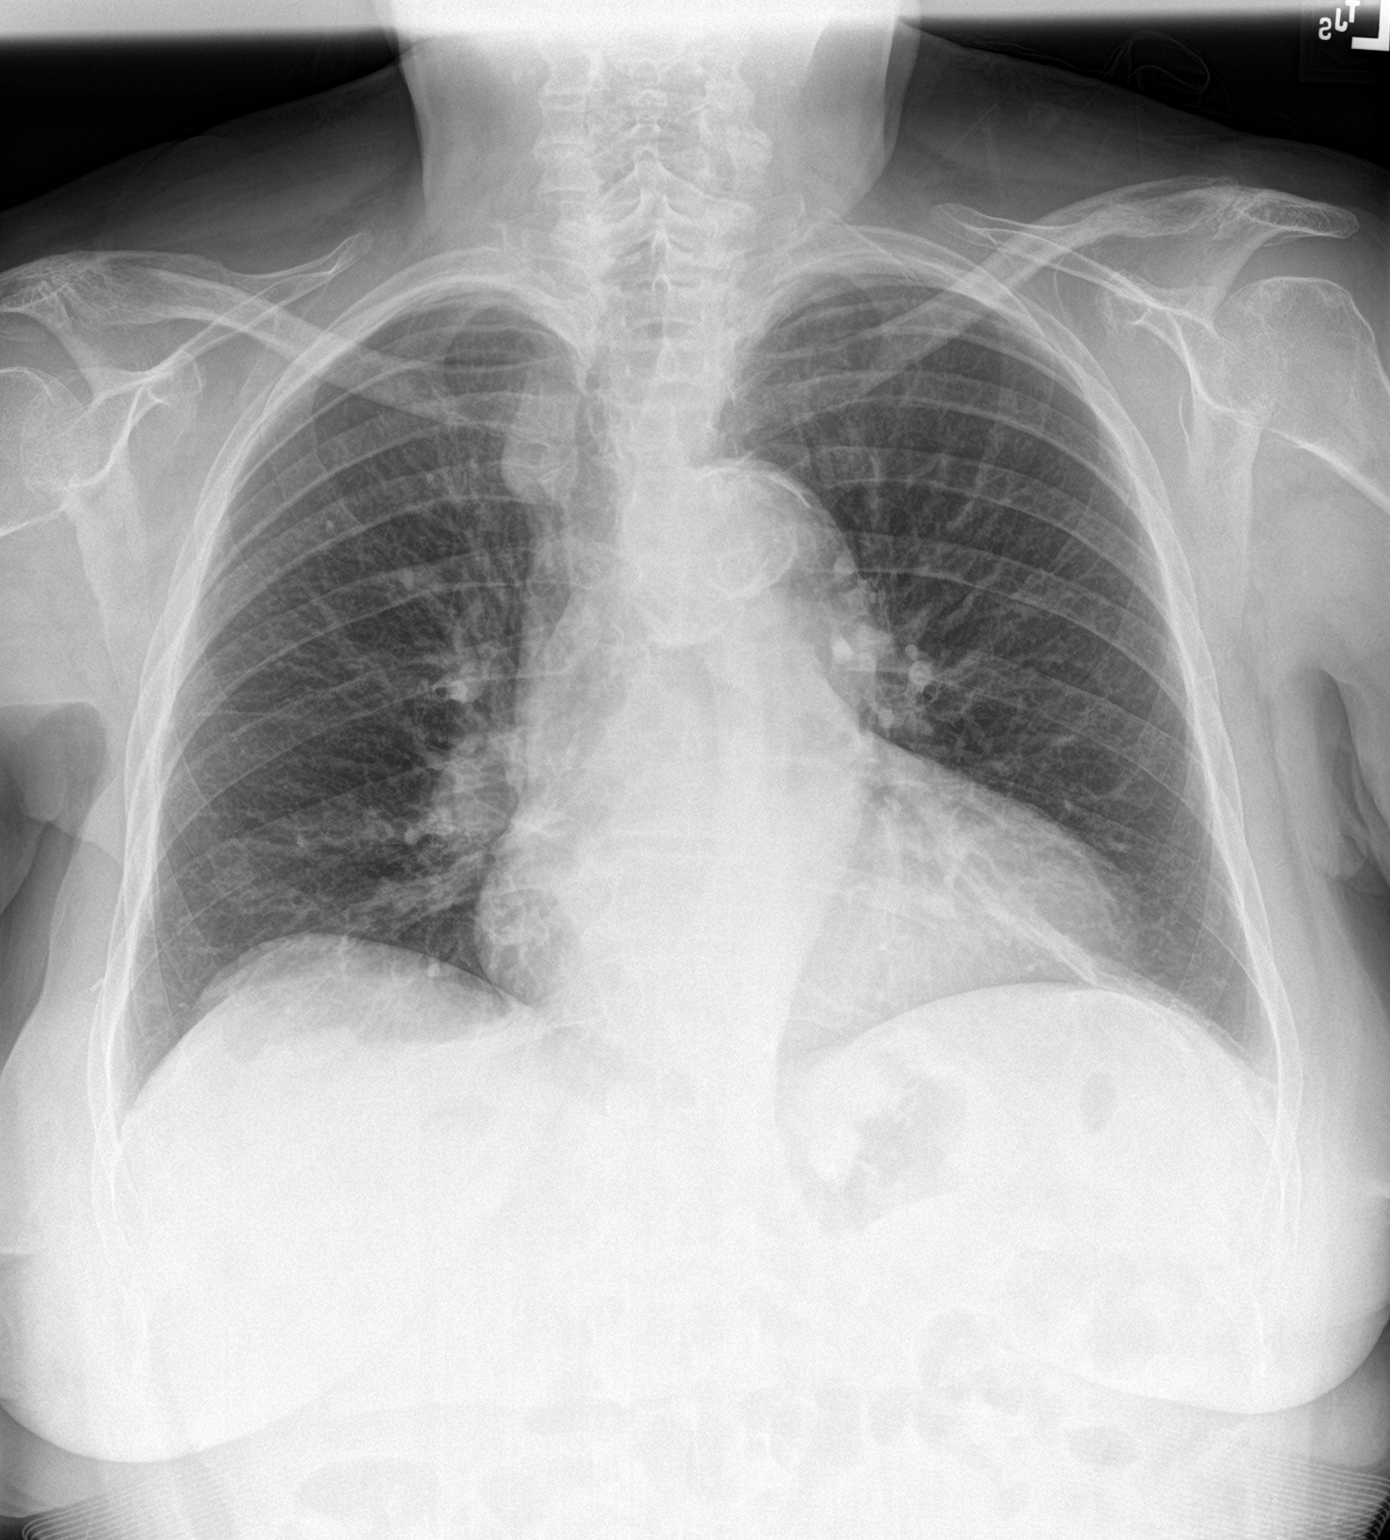
[im 2/2]
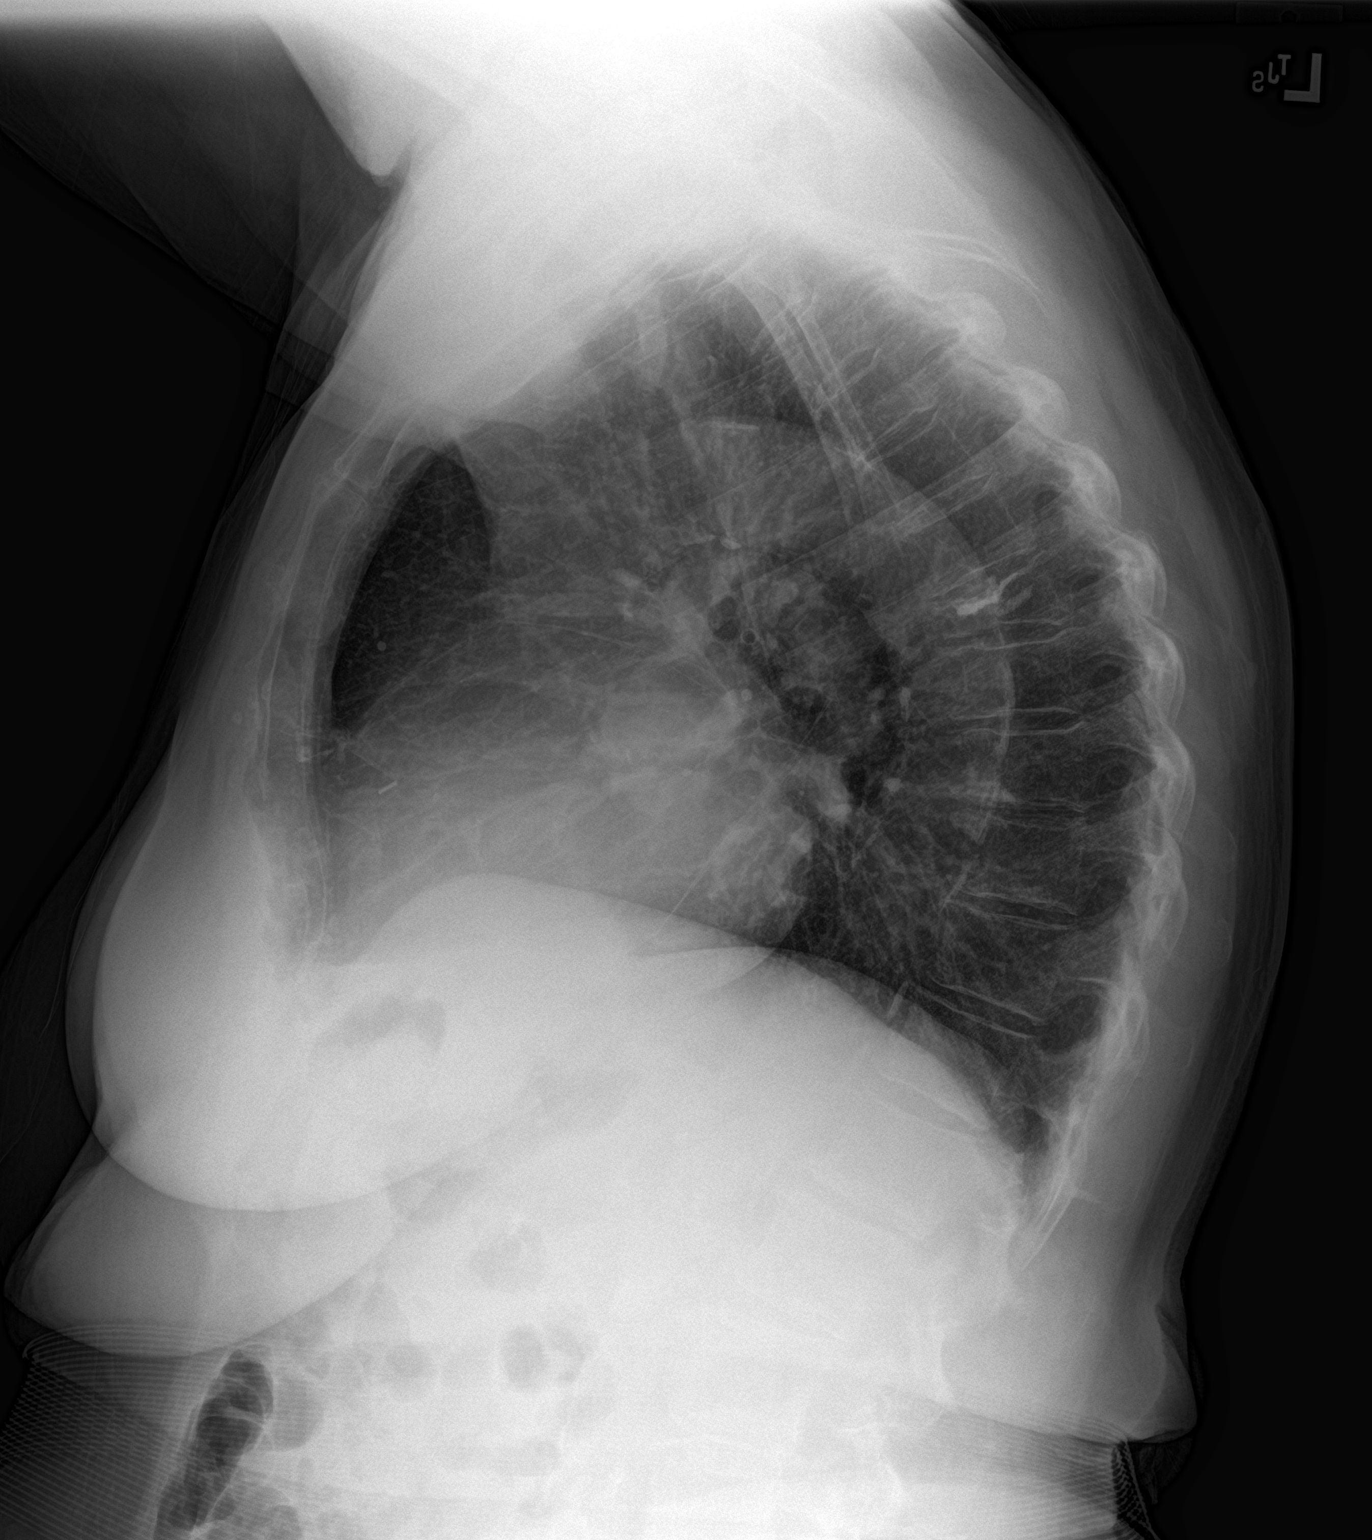

[2 of 2 positions shown; findings below may reference images not displayed]

FINDINGS: There is no focal consolidation. There is left basilar scarring.
There is no pleural effusion or pneumothorax. There is stable
cardiomegaly. There is thoracic aortic atherosclerosis.

The osseous structures are unremarkable.
IMPRESSION: No active cardiopulmonary disease.

## 2020-02-14 ENCOUNTER — Ambulatory Visit: Payer: PPO | Admitting: Urology

## 2020-02-14 NOTE — Progress Notes (Signed)
02/15/2020 10:12 AM   Tara Ramos [birth year] [id number]  Referring provider: Adin Hector, MD [facility] [place],   [address]  Chief Complaint  Patient presents with  . Results    HPI: Tara Ramos is an 86 y.o. female with bilateral renal masses s/p right robotic nephrectomy in [place] by Dr. Alyson Ingles in 02/01/15 and CT guided microwave ablation of the left renal mass 05/24/15.She returns today for routine annual surveillance with imaging.  She was last seen 1 year ago.  She continues to do well.  She has no new medical issues or significant changes in her medical history over the last year.  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  She has had no weight loss.    Patient denies any modifying or aggravating factors.  Patient denies any gross hematuria, dysuria or suprapubic/flank pain.  Patient denies any fevers, chills, nausea or vomiting.   There has been 4 years since her last procedure, we elected to follow-up with renal ultrasound with her history of multifocal disease and a solitary kidney.  Renal ultrasound 01/2020 unremarkable.  Component     Latest Ref Rng & Units 01/04/2020  Creatinine     0.44 - 1.00 mg/dL 1.17 (H)    RCC history Initial imaging by MRI to confirm tthe size and location of these lesions. She was noted to have a 4.5 x 2.8 x 2.2 cm RIGHT medial enhancing renal mass along with a 1.6 cm enhancing inferior pole renal mass. On the LEFT, she was found to have a 1.9 cm LEFT enhancing renal mass as well.   She underwent R roboticnephrectomy per Dr. Alyson Ingles on 02/01/2015. 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 The inferior pole lesion was not consistent with malignancy. No issues postoperatively.  She then underwent successful CT-guided microwave ablation of her left renal lesion on 05/24/2015. This was complicated by a left pleural  effusion which resolved spontaneously.  CXR was also negative.     PMH: Past Medical History:  Diagnosis Date  . Arthritis   . Breast cancer ([place]) 1992   right breast/radiation  . Breast cancer ([place]) 1990   left breast/radiation  . Breast cancer ([place]) 2014   right breast  . Breast cancer, right breast ([place]) 12/16/2012   left, then right, then right breast cancers (lumpectomies and radiation therapy)  . Chronic kidney disease   . Dysrhythmia    HX A FIB - FOLLOWED BY [name]  . GERD (gastroesophageal reflux disease)   . Heart murmur    hx of years ago   . Hemorrhoids   . Hypertension   . Personal history of radiation therapy    1990/1992  . Renal cell cancer, right ([place]) 11/2014   Right Nephrectomy and Left renal ablation for lesion.   . Renal insufficiency   . Status post radiation therapy    breast cancer bilateral    Surgical History: Past Surgical History:  Procedure Laterality Date  . BREAST BIOPSY Right 03/10/2016   -  STROMAL SCLEROSIS, GIANT CELL REACTION, CHRONIC INFLAMMATION AND FAT   . BREAST BIOPSY Right 12/12/2019   [place] bx of mass, vision marker, path pending  . BREAST BIOPSY Right 12/12/2019   [place] bx of LN, hydromarker, path pending  . BREAST BIOPSY Left 12/12/2019   stereo bx of calcs, coil marker, path pending  . BREAST EXCISIONAL BIOPSY  Right 2014   +  . BREAST EXCISIONAL BIOPSY Right 1992   +  . BREAST EXCISIONAL BIOPSY Left 1990   +  . BREAST LUMPECTOMY Left 1990  . BREAST LUMPECTOMY Right 1992  . BROW LIFT Bilateral 11/10/2019   Procedure: BLEPHAROPLASTY UPPER EYELID; W/EXCESS SKIN BILATERAL & ECTROPION REPAIR, EXTENSIVE LEFT LOWER LID;  Surgeon: Karle Starch, MD;  Location: [place];  Service: Ophthalmology;  Laterality: Bilateral;  . DILATION AND CURETTAGE OF UTERUS    . EYE SURGERY Bilateral    cataract extraction  . KYPHOPLASTY N/A 10/09/2014   Procedure: KYPHOPLASTY;  Surgeon: Hessie Knows, MD;  Location: [facility];  Service:  Orthopedics;  Laterality: N/A;  T7 Kyphoplasty with bone biopsy  . MASTECTOMY MODIFIED RADICAL Right 01/08/2020   Procedure: MASTECTOMY MODIFIED RADICAL;  Surgeon: Robert Bellow, MD;  Location: [facility];  Service: General;  Laterality: Right;  . OTHER SURGICAL HISTORY     radiation therapy breast  . ROBOT ASSISTED LAPAROSCOPIC NEPHRECTOMY Right 02/01/2015   Procedure: ROBOTIC ASSISTED LAPAROSCOPIC RIGHT NEPHRECTOMY;  Surgeon: Cleon Gustin, MD;  [facility];  Service: Urology;  Laterality: Right;  . SIMPLE MASTECTOMY WITH AXILLARY SENTINEL NODE BIOPSY Left 01/08/2020   Procedure: SIMPLE MASTECTOMY;  Surgeon: Robert Bellow, MD;  Location: [facility];  Service: General;  Laterality: Left;    Home Medications:  Allergies as of 02/15/2020      Reactions   Morphine And Related Nausea And Vomiting   Lisinopril Cough   Tape Other (See Comments)   Blisters underneath; wide white hypofix tape post kidney biopsy       Medication List       Accurate as of February 15, 2020 10:12 AM. If you have any questions, ask your nurse or doctor.        STOP taking these medications   erythromycin ophthalmic ointment Stopped by: Zara Council, PA-C   HYDROcodone-acetaminophen 5-325 MG tablet Commonly known as: NORCO/VICODIN Stopped by: July Linam, PA-C   traMADol 50 MG tablet Commonly known as: Ultram Stopped by: Zara Council, PA-C     TAKE these medications   amLODipine 5 MG tablet Commonly known as: NORVASC Take 7.5 mg by mouth every morning.   Calcium + D3 600-200 MG-UNIT Tabs Take 1 tablet by mouth daily.   docusate sodium 100 MG capsule Commonly known as: COLACE Take 100 mg by mouth at bedtime.   hydrochlorothiazide 25 MG tablet Commonly known as: HYDRODIURIL Take 50 mg by mouth daily.   losartan 100 MG tablet Commonly known as: COZAAR Take 100 mg by mouth every morning.   metoprolol tartrate 100 MG tablet Commonly known as: LOPRESSOR Take 100 mg by  mouth 2 (two) times daily.   multivitamin with minerals Tabs tablet Take 1 tablet by mouth daily.   PRESERVISION AREDS 2 PO Take 1 capsule by mouth in the morning and at bedtime.   vitamin C 1000 MG tablet Take 1,000 mg by mouth daily.       Allergies:  Allergies  Allergen Reactions  . Morphine And Related Nausea And Vomiting  . Lisinopril Cough  . Tape Other (See Comments)    Blisters underneath; wide white hypofix tape post kidney biopsy     Family History: Family History  Problem Relation Age of Onset  . Stomach cancer Mother   . Stroke Father   . Brain cancer Sister   . Breast cancer Daughter 19  . Bladder Cancer Neg Hx   .  Prostate cancer Neg Hx   . Kidney cancer Neg Hx     Social History:  reports that she quit smoking about 47 years ago. Her smoking use included cigarettes. She quit after 20.00 years of use. She has never used smokeless tobacco. She reports current alcohol use of about 1.0 standard drink of alcohol per week. She reports that she does not use drugs.  ROS: For pertinent review of systems please refer to history of present illness  Physical Exam: BP (!) 185/76 (BP Location: Left Arm, Patient Position: Sitting, Cuff Size: Large)   Pulse 60   Ht 5\' 2"  (1.575 m)   Wt 173 lb (78.5 kg)   BMI 31.64 kg/m   Constitutional:  Well nourished. Alert and oriented, No acute distress. HEENT: [place] AT, mask in place.  Trachea midline Cardiovascular: No clubbing, cyanosis, or edema. Respiratory: Normal respiratory effort, no increased work of breathing. Neurologic: Grossly intact, no focal deficits, moving all 4 extremities. Psychiatric: Normal mood and affect.   Laboratory Data: As above  Pertinent Imaging: CLINICAL DATA:  History of renal cell cancer. History of right nephrectomy. Cryoablation in the left kidney.  EXAM: RENAL / URINARY TRACT ULTRASOUND COMPLETE  COMPARISON:  Renal ultrasound 02/10/2019  FINDINGS: Right Kidney:  Unremarkable  appearance of the right renal fossa.  Left Kidney:  Renal measurements: 10.0 x 6.3 x 4.6 cm = volume: 149 mL. Echogenicity within normal limits. There are 3 simple cysts measuring up to 2.6 cm. No suspicious solid mass. No hydronephrosis. No shadowing renal calculus.  Bladder:  Appears normal for degree of bladder distention.  Other:  None.  IMPRESSION: No acute findings. Multiple simple cysts in the left kidney. No suspicious mass.   Electronically Signed   By: Audie Pinto M.D.   On: 02/06/2020 08:22 I have independently reviewed the films.  See HPI.   Assessment & Plan:    1. History of renal cell cancer Cancer history as above NED In the setting of multi focal bilateral disease in the past as well as solitary kidney, we have continue to follow with renal ultrasound-she would like to continue doing this annually in the setting of her overall continued reasonably good health for her age Renal ultrasound 1 year - US RENAL; Future  2. Chronic renal insufficiency, stage 3 (moderate) Cr stable   Return in about 1 year (around 02/14/2021) for Follow up w/ RUS prior .  Zara Council, PA-C  [facility] [address] [place], [place] [address] [phone]

## 2020-02-15 ENCOUNTER — Encounter: Payer: Self-pay | Admitting: Urology

## 2020-02-15 ENCOUNTER — Ambulatory Visit: Payer: PPO | Admitting: Urology

## 2020-02-15 ENCOUNTER — Other Ambulatory Visit: Payer: Self-pay

## 2020-02-15 VITALS — BP 185/76 | HR 60 | Ht 62.0 in | Wt 173.0 lb

## 2020-02-15 DIAGNOSIS — Z85528 Personal history of other malignant neoplasm of kidney: Secondary | ICD-10-CM | POA: Insufficient documentation

## 2020-02-15 DIAGNOSIS — C649 Malignant neoplasm of unspecified kidney, except renal pelvis: Secondary | ICD-10-CM | POA: Diagnosis not present

## 2020-03-07 DIAGNOSIS — I2510 Atherosclerotic heart disease of native coronary artery without angina pectoris: Secondary | ICD-10-CM | POA: Diagnosis not present

## 2020-03-07 DIAGNOSIS — E7849 Other hyperlipidemia: Secondary | ICD-10-CM | POA: Diagnosis not present

## 2020-03-07 DIAGNOSIS — N183 Chronic kidney disease, stage 3 unspecified: Secondary | ICD-10-CM | POA: Diagnosis not present

## 2020-03-07 DIAGNOSIS — K219 Gastro-esophageal reflux disease without esophagitis: Secondary | ICD-10-CM | POA: Diagnosis not present

## 2020-03-07 DIAGNOSIS — I10 Essential (primary) hypertension: Secondary | ICD-10-CM | POA: Diagnosis not present

## 2020-03-14 DIAGNOSIS — H6123 Impacted cerumen, bilateral: Secondary | ICD-10-CM | POA: Diagnosis not present

## 2020-03-14 DIAGNOSIS — K219 Gastro-esophageal reflux disease without esophagitis: Secondary | ICD-10-CM | POA: Diagnosis not present

## 2020-03-14 DIAGNOSIS — N1832 Chronic kidney disease, stage 3b: Secondary | ICD-10-CM | POA: Diagnosis not present

## 2020-03-14 DIAGNOSIS — I700 Atherosclerosis of aorta: Secondary | ICD-10-CM | POA: Diagnosis not present

## 2020-03-14 DIAGNOSIS — I10 Essential (primary) hypertension: Secondary | ICD-10-CM | POA: Diagnosis not present

## 2020-03-14 DIAGNOSIS — I2510 Atherosclerotic heart disease of native coronary artery without angina pectoris: Secondary | ICD-10-CM | POA: Diagnosis not present

## 2020-03-14 DIAGNOSIS — Z85528 Personal history of other malignant neoplasm of kidney: Secondary | ICD-10-CM | POA: Diagnosis not present

## 2020-03-14 DIAGNOSIS — Z853 Personal history of malignant neoplasm of breast: Secondary | ICD-10-CM | POA: Diagnosis not present

## 2020-03-14 DIAGNOSIS — M791 Myalgia, unspecified site: Secondary | ICD-10-CM | POA: Diagnosis not present

## 2020-03-14 DIAGNOSIS — Z0000 Encounter for general adult medical examination without abnormal findings: Secondary | ICD-10-CM | POA: Diagnosis not present

## 2020-03-14 DIAGNOSIS — E7849 Other hyperlipidemia: Secondary | ICD-10-CM | POA: Diagnosis not present

## 2020-03-14 DIAGNOSIS — Z780 Asymptomatic menopausal state: Secondary | ICD-10-CM | POA: Diagnosis not present

## 2020-03-15 DIAGNOSIS — C50111 Malignant neoplasm of central portion of right female breast: Secondary | ICD-10-CM | POA: Diagnosis not present

## 2020-03-15 DIAGNOSIS — Z9013 Acquired absence of bilateral breasts and nipples: Secondary | ICD-10-CM | POA: Diagnosis not present

## 2020-03-15 DIAGNOSIS — C50112 Malignant neoplasm of central portion of left female breast: Secondary | ICD-10-CM | POA: Diagnosis not present

## 2020-03-25 DIAGNOSIS — Z9013 Acquired absence of bilateral breasts and nipples: Secondary | ICD-10-CM | POA: Diagnosis not present

## 2020-03-25 DIAGNOSIS — C50112 Malignant neoplasm of central portion of left female breast: Secondary | ICD-10-CM | POA: Diagnosis not present

## 2020-03-25 DIAGNOSIS — C50111 Malignant neoplasm of central portion of right female breast: Secondary | ICD-10-CM | POA: Diagnosis not present

## 2020-04-05 DIAGNOSIS — H6123 Impacted cerumen, bilateral: Secondary | ICD-10-CM | POA: Diagnosis not present

## 2020-04-05 DIAGNOSIS — H903 Sensorineural hearing loss, bilateral: Secondary | ICD-10-CM | POA: Diagnosis not present

## 2020-04-09 DIAGNOSIS — Z853 Personal history of malignant neoplasm of breast: Secondary | ICD-10-CM | POA: Diagnosis not present

## 2020-05-09 DIAGNOSIS — L7634 Postprocedural seroma of skin and subcutaneous tissue following other procedure: Secondary | ICD-10-CM | POA: Diagnosis not present

## 2020-05-09 DIAGNOSIS — M810 Age-related osteoporosis without current pathological fracture: Secondary | ICD-10-CM | POA: Diagnosis not present

## 2020-05-09 DIAGNOSIS — N6489 Other specified disorders of breast: Secondary | ICD-10-CM | POA: Diagnosis not present

## 2020-07-18 DIAGNOSIS — Z853 Personal history of malignant neoplasm of breast: Secondary | ICD-10-CM | POA: Diagnosis not present

## 2020-09-05 DIAGNOSIS — N1832 Chronic kidney disease, stage 3b: Secondary | ICD-10-CM | POA: Diagnosis not present

## 2020-09-12 DIAGNOSIS — M791 Myalgia, unspecified site: Secondary | ICD-10-CM | POA: Diagnosis not present

## 2020-09-12 DIAGNOSIS — M810 Age-related osteoporosis without current pathological fracture: Secondary | ICD-10-CM | POA: Diagnosis not present

## 2020-09-12 DIAGNOSIS — N1832 Chronic kidney disease, stage 3b: Secondary | ICD-10-CM | POA: Diagnosis not present

## 2020-09-12 DIAGNOSIS — I10 Essential (primary) hypertension: Secondary | ICD-10-CM | POA: Diagnosis not present

## 2020-09-12 DIAGNOSIS — Z85528 Personal history of other malignant neoplasm of kidney: Secondary | ICD-10-CM | POA: Diagnosis not present

## 2020-09-12 DIAGNOSIS — I700 Atherosclerosis of aorta: Secondary | ICD-10-CM | POA: Diagnosis not present

## 2020-09-12 DIAGNOSIS — K219 Gastro-esophageal reflux disease without esophagitis: Secondary | ICD-10-CM | POA: Diagnosis not present

## 2020-09-12 DIAGNOSIS — Z853 Personal history of malignant neoplasm of breast: Secondary | ICD-10-CM | POA: Diagnosis not present

## 2020-09-12 DIAGNOSIS — E7849 Other hyperlipidemia: Secondary | ICD-10-CM | POA: Diagnosis not present

## 2020-09-12 DIAGNOSIS — I2510 Atherosclerotic heart disease of native coronary artery without angina pectoris: Secondary | ICD-10-CM | POA: Diagnosis not present

## 2020-10-03 DIAGNOSIS — H6063 Unspecified chronic otitis externa, bilateral: Secondary | ICD-10-CM | POA: Diagnosis not present

## 2020-10-03 DIAGNOSIS — H6123 Impacted cerumen, bilateral: Secondary | ICD-10-CM | POA: Diagnosis not present

## 2020-10-10 DIAGNOSIS — I10 Essential (primary) hypertension: Secondary | ICD-10-CM | POA: Diagnosis not present

## 2020-10-17 DIAGNOSIS — E7849 Other hyperlipidemia: Secondary | ICD-10-CM | POA: Diagnosis not present

## 2020-10-17 DIAGNOSIS — M791 Myalgia, unspecified site: Secondary | ICD-10-CM | POA: Diagnosis not present

## 2020-10-17 DIAGNOSIS — I10 Essential (primary) hypertension: Secondary | ICD-10-CM | POA: Diagnosis not present

## 2020-10-17 DIAGNOSIS — I700 Atherosclerosis of aorta: Secondary | ICD-10-CM | POA: Diagnosis not present

## 2020-10-17 DIAGNOSIS — I2510 Atherosclerotic heart disease of native coronary artery without angina pectoris: Secondary | ICD-10-CM | POA: Diagnosis not present

## 2020-10-17 DIAGNOSIS — K219 Gastro-esophageal reflux disease without esophagitis: Secondary | ICD-10-CM | POA: Diagnosis not present

## 2020-10-17 DIAGNOSIS — N1832 Chronic kidney disease, stage 3b: Secondary | ICD-10-CM | POA: Diagnosis not present

## 2020-10-31 DIAGNOSIS — I10 Essential (primary) hypertension: Secondary | ICD-10-CM | POA: Diagnosis not present

## 2021-01-16 DIAGNOSIS — Z171 Estrogen receptor negative status [ER-]: Secondary | ICD-10-CM | POA: Diagnosis not present

## 2021-01-16 DIAGNOSIS — C50411 Malignant neoplasm of upper-outer quadrant of right female breast: Secondary | ICD-10-CM | POA: Diagnosis not present

## 2021-01-23 DIAGNOSIS — Z961 Presence of intraocular lens: Secondary | ICD-10-CM | POA: Diagnosis not present

## 2021-02-12 ENCOUNTER — Other Ambulatory Visit: Payer: Self-pay

## 2021-02-12 ENCOUNTER — Ambulatory Visit
Admission: RE | Admit: 2021-02-12 | Discharge: 2021-02-12 | Disposition: A | Discharge status: Home / Self Care | Payer: PPO | Source: Ambulatory Visit | Attending: Urology | Admitting: Urology

## 2021-02-12 DIAGNOSIS — C641 Malignant neoplasm of right kidney, except renal pelvis: Secondary | ICD-10-CM | POA: Diagnosis not present

## 2021-02-12 DIAGNOSIS — Z905 Acquired absence of kidney: Secondary | ICD-10-CM | POA: Insufficient documentation

## 2021-02-12 DIAGNOSIS — Q6102 Congenital multiple renal cysts: Secondary | ICD-10-CM | POA: Insufficient documentation

## 2021-02-12 DIAGNOSIS — N281 Cyst of kidney, acquired: Secondary | ICD-10-CM | POA: Diagnosis not present

## 2021-02-12 DIAGNOSIS — C649 Malignant neoplasm of unspecified kidney, except renal pelvis: Secondary | ICD-10-CM

## 2021-02-13 NOTE — Progress Notes (Signed)
02/14/2021 9:33 AM   Tara Ramos June 25, 1931 [id number]  Referring provider: Adin Hector, MD [place] [facility] [place],  [place] [address]  Chief Complaint  Patient presents with   Follow-up    Urological history: 1. Renal cell carcinoma -R robotic nephrectomy per Dr. Alyson Ingles on 02/01/2015.  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 female with bilateral renal masses s/p right robotic nephrectomy in [place] by Dr. Alyson Ingles in 02/01/15 and CT guided microwave ablation of the left renal mass 05/24/15.  She returns today for routine annual surveillance with imaging.  She was last seen 1 year ago.  RUS 01/2021 - stable left renal cysts- no solid lesions in the left kidney - right nephrectomy  Creatinine 1.4  She has no complaints at this time.  Patient denies any modifying or aggravating factors.  Patient denies any gross hematuria, dysuria or suprapubic/flank pain.  Patient denies any fevers, chills, nausea or vomiting.     PMH: Past Medical History:  Diagnosis Date   Arthritis    Breast cancer ([place]) 1992   right breast/radiation   Breast cancer ([place]) 1990   left breast/radiation   Breast cancer ([place]) 2014   right breast   Breast cancer, right breast ([place]) 12/16/2012   left, then right, then right breast cancers (lumpectomies and radiation therapy)   Chronic kidney disease    Dysrhythmia    HX A FIB - FOLLOWED BY [name]   GERD (gastroesophageal reflux disease)    Heart murmur    hx of years ago    Hemorrhoids    Hypertension    Personal history of radiation therapy    1990/1992   Renal cell cancer, right ([place]) 11/2014   Right Nephrectomy and Left renal ablation for lesion.    Renal insufficiency    Status post radiation therapy    breast cancer bilateral     Surgical History: Past Surgical History:  Procedure Laterality Date   BREAST BIOPSY Right 03/10/2016   -  STROMAL SCLEROSIS, GIANT CELL REACTION, CHRONIC INFLAMMATION AND FAT    BREAST BIOPSY Right 12/12/2019   [place] bx of mass, vision marker, path pending   BREAST BIOPSY Right 12/12/2019   [place] bx of LN, hydromarker, path pending   BREAST BIOPSY Left 12/12/2019   stereo bx of calcs, coil marker, path pending   BREAST EXCISIONAL BIOPSY Right 2014   +   BREAST EXCISIONAL BIOPSY Right 1992   +   BREAST EXCISIONAL BIOPSY Left 1990   +   BREAST LUMPECTOMY Left 1990   BREAST LUMPECTOMY Right 1992   BROW LIFT Bilateral 11/10/2019   Procedure: BLEPHAROPLASTY UPPER EYELID; W/EXCESS SKIN BILATERAL & ECTROPION REPAIR, EXTENSIVE LEFT LOWER LID;  Surgeon: Karle Starch, MD;  Location: [place];  Service: Ophthalmology;  Laterality: Bilateral;   DILATION AND CURETTAGE OF UTERUS     EYE SURGERY Bilateral    cataract extraction   KYPHOPLASTY N/A 10/09/2014   Procedure: KYPHOPLASTY;  Surgeon: Hessie Knows, MD;  Location: [facility];  Service: Orthopedics;  Laterality: N/A;  T7 Kyphoplasty with bone biopsy   MASTECTOMY MODIFIED RADICAL Right 01/08/2020   Procedure: MASTECTOMY MODIFIED RADICAL;  Surgeon: Robert Bellow, MD;  Location: [facility];  Service: General;  Laterality: Right;   OTHER  SURGICAL HISTORY     radiation therapy breast   ROBOT ASSISTED LAPAROSCOPIC NEPHRECTOMY Right 02/01/2015   Procedure: ROBOTIC ASSISTED LAPAROSCOPIC RIGHT NEPHRECTOMY;  Surgeon: Cleon Gustin, MD;  Location: [facility];  Service: Urology;  Laterality: Right;   SIMPLE MASTECTOMY WITH AXILLARY SENTINEL NODE BIOPSY Left 01/08/2020   Procedure: SIMPLE MASTECTOMY;  Surgeon: Robert Bellow, MD;  Location: [facility];  Service: General;  Laterality: Left;    Home Medications:  Allergies as of 02/14/2021       Reactions   Morphine And Related Nausea And Vomiting   Lisinopril Cough   Tape Other (See  Comments)   Blisters underneath; wide white hypofix tape post kidney biopsy         Medication List        Accurate as of February 14, 2021  9:33 AM. If you have any questions, ask your nurse or doctor.          STOP taking these medications    hydrochlorothiazide 25 MG tablet Commonly known as: HYDRODIURIL Stopped by: Epiphany Seltzer, PA-C       TAKE these medications    amLODipine 5 MG tablet Commonly known as: NORVASC Take 7.5 mg by mouth every morning.   Calcium + D3 600-200 MG-UNIT Tabs Take 1 tablet by mouth daily.   docusate sodium 100 MG capsule Commonly known as: COLACE Take 100 mg by mouth at bedtime.   losartan 100 MG tablet Commonly known as: COZAAR Take 100 mg by mouth every morning.   metoprolol tartrate 100 MG tablet Commonly known as: LOPRESSOR Take 100 mg by mouth 2 (two) times daily.   multivitamin with minerals Tabs tablet Take 1 tablet by mouth daily.   PRESERVISION AREDS 2 PO Take 1 capsule by mouth in the morning and at bedtime.   spironolactone 25 MG tablet Commonly known as: ALDACTONE Take 25 mg by mouth daily.   vitamin C 1000 MG tablet Take 1,000 mg by mouth daily.        Allergies:  Allergies  Allergen Reactions   Morphine And Related Nausea And Vomiting   Lisinopril Cough   Tape Other (See Comments)    Blisters underneath; wide white hypofix tape post kidney biopsy     Family History: Family History  Problem Relation Age of Onset   Stomach cancer Mother    Stroke Father    Brain cancer Sister    Breast cancer Daughter 62   Bladder Cancer Neg Hx    Prostate cancer Neg Hx    Kidney cancer Neg Hx     Social History:  reports that she quit smoking about 48 years ago. Her smoking use included cigarettes. She has never used smokeless tobacco. She reports current alcohol use of about 1.0 standard drink per week. She reports that she does not use drugs.  ROS: For pertinent review of systems please refer to  history of present illness  Physical Exam: BP (!) 171/95   Pulse 88   Ht 5' 2"  (1.575 m)   Wt 173 lb (78.5 kg)   BMI 31.64 kg/m   Constitutional:  Well nourished. Alert and oriented, No acute distress. HEENT: [place] AT, mask in place.  Trachea midline Cardiovascular: No clubbing, cyanosis, or edema. Respiratory: Normal respiratory effort, no increased work of breathing. Neurologic: Grossly intact, no focal deficits, moving all 4 extremities. Psychiatric: Normal mood and affect.   Laboratory Data: Glucose 70 - 110 mg/dL 94   Sodium 136 - 145 mmol/L 140  Potassium 3.6 - 5.1 mmol/L 5.2 High    Chloride 97 - 109 mmol/L 106   Carbon Dioxide (CO2) 22.0 - 32.0 mmol/L 31.3   Calcium 8.7 - 10.3 mg/dL 10.1   Urea Nitrogen (BUN) 7 - 25 mg/dL 32 High    Creatinine 0.6 - 1.1 mg/dL 1.4 High    Glomerular Filtration Rate (eGFR), MDRD Estimate >60 mL/min/1.73sq m 36 Low    BUN/Crea Ratio 6.0 - 20.0 22.9 High    Anion Gap w/K 6.0 - 16.0 7.9   Resulting Agency  [facility]  Specimen Collected: 10/31/20 09:35 Last Resulted: 10/31/20 11:37  Received From: [facility]  Result Received: 01/17/21 09:33   WBC (White Blood Cell Count) 4.1 - 10.2 10^3/uL 6.5   RBC (Red Blood Cell Count) 4.04 - 5.48 10^6/uL 5.16   Hemoglobin 12.0 - 15.0 gm/dL 15.5 High    Hematocrit 35.0 - 47.0 % 47.6 High    MCV (Mean Corpuscular Volume) 80.0 - 100.0 fl 92.2   MCH (Mean Corpuscular Hemoglobin) 27.0 - 31.2 pg 30.0   MCHC (Mean Corpuscular Hemoglobin Concentration) 32.0 - 36.0 gm/dL 32.6   Platelet Count 150 - 450 10^3/uL 248   RDW-CV (Red Cell Distribution Width) 11.6 - 14.8 % 13.2   MPV (Mean Platelet Volume) 9.4 - 12.4 fl 9.6   Neutrophils 1.50 - 7.80 10^3/uL 4.35   Lymphocytes 1.00 - 3.60 10^3/uL 1.13   Monocytes 0.00 - 1.50 10^3/uL 0.61   Eosinophils 0.00 - 0.55 10^3/uL 0.35   Basophils 0.00 - 0.09 10^3/uL 0.06   Neutrophil % 32.0 - 70.0 % 66.7   Lymphocyte % 10.0 - 50.0 % 17.4    Monocyte % 4.0 - 13.0 % 9.4   Eosinophil % 1.0 - 5.0 % 5.4 High    Basophil% 0.0 - 2.0 % 0.9   Immature Granulocyte % <=0.7 % 0.2   Immature Granulocyte Count <=0.06 10^3/L 0.01   Resulting Agency  [facility] - [facility]  Specimen Collected: 09/05/20 09:22 Last Resulted: 09/05/20 10:15  Received From: [place]  Result Received: 10/10/20 10:56   Color Yellow, Violet, Light Violet, Dark Violet Yellow   Clarity Clear Clear   Specific Gravity 1.000 - 1.030 1.020   pH, Urine 5.0 - 8.0 6.0   Protein, Urinalysis Negative, Trace mg/dL Negative   Glucose, Urinalysis Negative mg/dL Negative   Ketones, Urinalysis Negative mg/dL Negative   Blood, Urinalysis Negative Negative   Nitrite, Urinalysis Negative Negative   Leukocyte Esterase, Urinalysis Negative Small Abnormal    White Blood Cells, Urinalysis None Seen, 0-3 /hpf 0-3   Red Blood Cells, Urinalysis None Seen, 0-3 /hpf None Seen   Bacteria, Urinalysis None Seen /hpf Rare Abnormal    Squamous Epithelial Cells, Urinalysis Rare, Few, None Seen /hpf Few   Resulting Agency  [facility] - [facility]  Specimen Collected: 09/05/20 09:22 Last Resulted: 09/05/20 11:34  Received From: [facility]  Result Received: 10/10/20 10:56  I have reviewed the labs.   Pertinent Imaging: CLINICAL DATA:  Renal cell carcinoma. History of right nephrectomy. Left cryoablation.   EXAM: RENAL / URINARY TRACT ULTRASOUND COMPLETE   COMPARISON:  Renal ultrasound 02/05/2020.  Most recent CT 02/04/2018   FINDINGS: Right Kidney:   Surgically absent.   Left Kidney:   Renal measurements: 10.7 x 6.3 x 5.8 cm = volume: 206 mL. No hydronephrosis. No visualized renal calculi. Cryo ablation defect on CT is not well demonstrated by  ultrasound. Cyst in the upper pole measures 2.3 x 2.6 x 2.4 cm. Cyst in the lower pole measures 2.1 x 2.0 x 2.2 cm. Cyst in the mid kidney measures 1.3 cm. No visualized solid lesion.    Bladder:   Appears normal for degree of bladder distention.   Other:   None.   IMPRESSION: 1. Stable sonographic appearance of left renal cysts. 2. No solid lesion. The cryo ablation defect in the left kidney on prior CT is not seen by ultrasound. 3. Right nephrectomy.     Electronically Signed   By: Keith Rake M.D.   On: 02/12/2021 17:19 I have independently reviewed the films.  See HPI.   Assessment & Plan:    1. Renal cell cancer -cancer history as above -NED on recent ultrasound -continue to follow up with yearly ultrasound  2. CKD -creatinine stable   Return in about 1 year (around 02/14/2022) for RUS report .  Zara Council, PA-C  [facility] [address], [place], [place] [address] [phone]

## 2021-02-14 ENCOUNTER — Other Ambulatory Visit: Payer: Self-pay

## 2021-02-14 ENCOUNTER — Encounter: Payer: Self-pay | Admitting: Urology

## 2021-02-14 ENCOUNTER — Ambulatory Visit: Payer: PPO | Admitting: Urology

## 2021-02-14 VITALS — BP 171/95 | HR 88 | Ht 62.0 in | Wt 173.0 lb

## 2021-02-14 DIAGNOSIS — C649 Malignant neoplasm of unspecified kidney, except renal pelvis: Secondary | ICD-10-CM | POA: Diagnosis not present

## 2021-02-14 DIAGNOSIS — N189 Chronic kidney disease, unspecified: Secondary | ICD-10-CM

## 2021-02-21 ENCOUNTER — Ambulatory Visit: Payer: PPO | Admitting: Urology

## 2021-02-24 DIAGNOSIS — M25461 Effusion, right knee: Secondary | ICD-10-CM | POA: Diagnosis not present

## 2021-02-24 DIAGNOSIS — M1711 Unilateral primary osteoarthritis, right knee: Secondary | ICD-10-CM | POA: Diagnosis not present

## 2021-02-24 DIAGNOSIS — M25861 Other specified joint disorders, right knee: Secondary | ICD-10-CM | POA: Diagnosis not present

## 2021-02-24 DIAGNOSIS — M25561 Pain in right knee: Secondary | ICD-10-CM | POA: Diagnosis not present

## 2021-02-24 DIAGNOSIS — M76891 Other specified enthesopathies of right lower limb, excluding foot: Secondary | ICD-10-CM | POA: Diagnosis not present

## 2021-03-26 DIAGNOSIS — M1711 Unilateral primary osteoarthritis, right knee: Secondary | ICD-10-CM | POA: Diagnosis not present

## 2021-03-26 DIAGNOSIS — M25561 Pain in right knee: Secondary | ICD-10-CM | POA: Diagnosis not present

## 2021-03-26 DIAGNOSIS — M25461 Effusion, right knee: Secondary | ICD-10-CM | POA: Diagnosis not present

## 2021-03-26 DIAGNOSIS — M76891 Other specified enthesopathies of right lower limb, excluding foot: Secondary | ICD-10-CM | POA: Diagnosis not present

## 2021-03-27 ENCOUNTER — Other Ambulatory Visit: Payer: Self-pay | Admitting: Sports Medicine

## 2021-03-27 DIAGNOSIS — M25561 Pain in right knee: Secondary | ICD-10-CM

## 2021-03-27 DIAGNOSIS — M25461 Effusion, right knee: Secondary | ICD-10-CM

## 2021-03-27 DIAGNOSIS — M1711 Unilateral primary osteoarthritis, right knee: Secondary | ICD-10-CM

## 2021-04-01 ENCOUNTER — Ambulatory Visit
Admission: RE | Admit: 2021-04-01 | Discharge: 2021-04-01 | Disposition: A | Discharge status: Home / Self Care | Payer: PPO | Source: Ambulatory Visit | Attending: Sports Medicine | Admitting: Sports Medicine

## 2021-04-01 DIAGNOSIS — M1711 Unilateral primary osteoarthritis, right knee: Secondary | ICD-10-CM | POA: Diagnosis not present

## 2021-04-01 DIAGNOSIS — M25461 Effusion, right knee: Secondary | ICD-10-CM

## 2021-04-01 DIAGNOSIS — M25561 Pain in right knee: Secondary | ICD-10-CM

## 2021-04-26 ENCOUNTER — Emergency Department: Payer: PPO

## 2021-04-26 ENCOUNTER — Encounter: Payer: Self-pay | Admitting: Emergency Medicine

## 2021-04-26 ENCOUNTER — Other Ambulatory Visit: Payer: Self-pay

## 2021-04-26 ENCOUNTER — Inpatient Hospital Stay (HOSPITAL_COMMUNITY)
Admission: AD | Admit: 2021-04-26 | Discharge: 2021-04-29 | DRG: 163 | Disposition: A | Discharge status: Home / Self Care | Payer: PPO | Source: Other Acute Inpatient Hospital | Attending: Internal Medicine | Admitting: Internal Medicine

## 2021-04-26 ENCOUNTER — Inpatient Hospital Stay
Admission: EM | Admit: 2021-04-26 | Discharge: 2021-04-26 | DRG: 175 | Disposition: A | Discharge status: Other Acute Inpatient Hospital | Payer: PPO | Attending: Internal Medicine | Admitting: Internal Medicine

## 2021-04-26 ENCOUNTER — Inpatient Hospital Stay (HOSPITAL_COMMUNITY): Payer: PPO

## 2021-04-26 ENCOUNTER — Inpatient Hospital Stay: Payer: PPO

## 2021-04-26 DIAGNOSIS — E875 Hyperkalemia: Secondary | ICD-10-CM | POA: Diagnosis not present

## 2021-04-26 DIAGNOSIS — I2699 Other pulmonary embolism without acute cor pulmonale: Principal | ICD-10-CM | POA: Diagnosis present

## 2021-04-26 DIAGNOSIS — Z85528 Personal history of other malignant neoplasm of kidney: Secondary | ICD-10-CM

## 2021-04-26 DIAGNOSIS — Z885 Allergy status to narcotic agent status: Secondary | ICD-10-CM | POA: Diagnosis not present

## 2021-04-26 DIAGNOSIS — I2510 Atherosclerotic heart disease of native coronary artery without angina pectoris: Secondary | ICD-10-CM | POA: Diagnosis not present

## 2021-04-26 DIAGNOSIS — M1990 Unspecified osteoarthritis, unspecified site: Secondary | ICD-10-CM | POA: Diagnosis present

## 2021-04-26 DIAGNOSIS — Z808 Family history of malignant neoplasm of other organs or systems: Secondary | ICD-10-CM | POA: Diagnosis not present

## 2021-04-26 DIAGNOSIS — I700 Atherosclerosis of aorta: Secondary | ICD-10-CM | POA: Diagnosis present

## 2021-04-26 DIAGNOSIS — Z888 Allergy status to other drugs, medicaments and biological substances status: Secondary | ICD-10-CM

## 2021-04-26 DIAGNOSIS — I10 Essential (primary) hypertension: Secondary | ICD-10-CM | POA: Diagnosis not present

## 2021-04-26 DIAGNOSIS — Z803 Family history of malignant neoplasm of breast: Secondary | ICD-10-CM

## 2021-04-26 DIAGNOSIS — I4821 Permanent atrial fibrillation: Secondary | ICD-10-CM | POA: Diagnosis not present

## 2021-04-26 DIAGNOSIS — Z91048 Other nonmedicinal substance allergy status: Secondary | ICD-10-CM

## 2021-04-26 DIAGNOSIS — I517 Cardiomegaly: Secondary | ICD-10-CM | POA: Diagnosis not present

## 2021-04-26 DIAGNOSIS — D72829 Elevated white blood cell count, unspecified: Secondary | ICD-10-CM | POA: Diagnosis present

## 2021-04-26 DIAGNOSIS — Z823 Family history of stroke: Secondary | ICD-10-CM | POA: Diagnosis not present

## 2021-04-26 DIAGNOSIS — Z853 Personal history of malignant neoplasm of breast: Secondary | ICD-10-CM | POA: Diagnosis not present

## 2021-04-26 DIAGNOSIS — I82432 Acute embolism and thrombosis of left popliteal vein: Secondary | ICD-10-CM | POA: Diagnosis present

## 2021-04-26 DIAGNOSIS — Z20822 Contact with and (suspected) exposure to covid-19: Secondary | ICD-10-CM | POA: Diagnosis not present

## 2021-04-26 DIAGNOSIS — I480 Paroxysmal atrial fibrillation: Secondary | ICD-10-CM | POA: Diagnosis not present

## 2021-04-26 DIAGNOSIS — N183 Chronic kidney disease, stage 3 unspecified: Secondary | ICD-10-CM | POA: Diagnosis present

## 2021-04-26 DIAGNOSIS — R911 Solitary pulmonary nodule: Secondary | ICD-10-CM | POA: Diagnosis present

## 2021-04-26 DIAGNOSIS — K219 Gastro-esophageal reflux disease without esophagitis: Secondary | ICD-10-CM | POA: Diagnosis present

## 2021-04-26 DIAGNOSIS — R000 Tachycardia, unspecified: Secondary | ICD-10-CM | POA: Diagnosis not present

## 2021-04-26 DIAGNOSIS — I129 Hypertensive chronic kidney disease with stage 1 through stage 4 chronic kidney disease, or unspecified chronic kidney disease: Secondary | ICD-10-CM | POA: Diagnosis present

## 2021-04-26 DIAGNOSIS — Z87891 Personal history of nicotine dependence: Secondary | ICD-10-CM

## 2021-04-26 DIAGNOSIS — Z9013 Acquired absence of bilateral breasts and nipples: Secondary | ICD-10-CM

## 2021-04-26 DIAGNOSIS — E041 Nontoxic single thyroid nodule: Secondary | ICD-10-CM | POA: Diagnosis present

## 2021-04-26 DIAGNOSIS — Z923 Personal history of irradiation: Secondary | ICD-10-CM

## 2021-04-26 DIAGNOSIS — Z905 Acquired absence of kidney: Secondary | ICD-10-CM

## 2021-04-26 DIAGNOSIS — Z79899 Other long term (current) drug therapy: Secondary | ICD-10-CM

## 2021-04-26 DIAGNOSIS — N1831 Chronic kidney disease, stage 3a: Secondary | ICD-10-CM | POA: Diagnosis present

## 2021-04-26 DIAGNOSIS — R0602 Shortness of breath: Secondary | ICD-10-CM | POA: Diagnosis not present

## 2021-04-26 DIAGNOSIS — Z86711 Personal history of pulmonary embolism: Secondary | ICD-10-CM | POA: Diagnosis not present

## 2021-04-26 DIAGNOSIS — J9602 Acute respiratory failure with hypercapnia: Secondary | ICD-10-CM | POA: Diagnosis not present

## 2021-04-26 DIAGNOSIS — M8580 Other specified disorders of bone density and structure, unspecified site: Secondary | ICD-10-CM | POA: Diagnosis not present

## 2021-04-26 DIAGNOSIS — E785 Hyperlipidemia, unspecified: Secondary | ICD-10-CM | POA: Diagnosis present

## 2021-04-26 DIAGNOSIS — J9601 Acute respiratory failure with hypoxia: Secondary | ICD-10-CM | POA: Diagnosis present

## 2021-04-26 DIAGNOSIS — I959 Hypotension, unspecified: Secondary | ICD-10-CM | POA: Diagnosis not present

## 2021-04-26 DIAGNOSIS — Z800 Family history of malignant neoplasm of digestive organs: Secondary | ICD-10-CM | POA: Diagnosis not present

## 2021-04-26 DIAGNOSIS — R600 Localized edema: Secondary | ICD-10-CM | POA: Diagnosis not present

## 2021-04-26 DIAGNOSIS — I2609 Other pulmonary embolism with acute cor pulmonale: Secondary | ICD-10-CM | POA: Diagnosis not present

## 2021-04-26 DIAGNOSIS — R0600 Dyspnea, unspecified: Secondary | ICD-10-CM | POA: Diagnosis not present

## 2021-04-26 HISTORY — PX: IR THROMBECT PRIM MECH INIT (INCLU) MOD SED: IMG2297

## 2021-04-26 HISTORY — PX: IR US GUIDE VASC ACCESS RIGHT: IMG2390

## 2021-04-26 LAB — CBC WITH DIFFERENTIAL/PLATELET
Abs Immature Granulocytes: 0.11 10*3/uL — ABNORMAL HIGH (ref 0.00–0.07)
Basophils Absolute: 0.1 10*3/uL (ref 0.0–0.1)
Basophils Relative: 0 %
Eosinophils Absolute: 0.1 10*3/uL (ref 0.0–0.5)
Eosinophils Relative: 1 %
HCT: 48.7 % — ABNORMAL HIGH (ref 36.0–46.0)
Hemoglobin: 15.9 g/dL — ABNORMAL HIGH (ref 12.0–15.0)
Immature Granulocytes: 1 %
Lymphocytes Relative: 5 %
Lymphs Abs: 0.7 10*3/uL (ref 0.7–4.0)
MCH: 30.2 pg (ref 26.0–34.0)
MCHC: 32.6 g/dL (ref 30.0–36.0)
MCV: 92.4 fL (ref 80.0–100.0)
Monocytes Absolute: 0.8 10*3/uL (ref 0.1–1.0)
Monocytes Relative: 5 %
Neutro Abs: 14.1 10*3/uL — ABNORMAL HIGH (ref 1.7–7.7)
Neutrophils Relative %: 88 %
Platelets: 324 10*3/uL (ref 150–400)
RBC: 5.27 MIL/uL — ABNORMAL HIGH (ref 3.87–5.11)
RDW: 13.5 % (ref 11.5–15.5)
WBC: 15.9 10*3/uL — ABNORMAL HIGH (ref 4.0–10.5)
nRBC: 0 % (ref 0.0–0.2)

## 2021-04-26 LAB — BASIC METABOLIC PANEL
Anion gap: 7 (ref 5–15)
BUN: 22 mg/dL (ref 8–23)
CO2: 25 mmol/L (ref 22–32)
Calcium: 9.9 mg/dL (ref 8.9–10.3)
Chloride: 108 mmol/L (ref 98–111)
Creatinine, Ser: 1.29 mg/dL — ABNORMAL HIGH (ref 0.44–1.00)
GFR, Estimated: 40 mL/min — ABNORMAL LOW (ref >60)
Glucose, Bld: 170 mg/dL — ABNORMAL HIGH (ref 70–99)
Potassium: 5.2 mmol/L — ABNORMAL HIGH (ref 3.5–5.1)
Sodium: 140 mmol/L (ref 135–145)

## 2021-04-26 LAB — APTT: aPTT: 118 seconds — ABNORMAL HIGH (ref 24–36)

## 2021-04-26 LAB — GLUCOSE, CAPILLARY: Glucose-Capillary: 132 mg/dL — ABNORMAL HIGH (ref 70–99)

## 2021-04-26 LAB — BRAIN NATRIURETIC PEPTIDE: B Natriuretic Peptide: 337.5 pg/mL — ABNORMAL HIGH (ref 0.0–100.0)

## 2021-04-26 LAB — TROPONIN I (HIGH SENSITIVITY)
Troponin I (High Sensitivity): 9 ng/L (ref <18)
Troponin I (High Sensitivity): 11 ng/L (ref <18)

## 2021-04-26 LAB — RESP PANEL BY RT-PCR (FLU A&B, COVID) ARPGX2
Influenza A by PCR: NEGATIVE
Influenza B by PCR: NEGATIVE
SARS Coronavirus 2 by RT PCR: NEGATIVE

## 2021-04-26 LAB — PROTIME-INR
INR: 1.1 (ref 0.8–1.2)
Prothrombin Time: 14.1 seconds (ref 11.4–15.2)

## 2021-04-26 LAB — MRSA NEXT GEN BY PCR, NASAL: MRSA by PCR Next Gen: NOT DETECTED

## 2021-04-26 LAB — HEPARIN LEVEL (UNFRACTIONATED): Heparin Unfractionated: 0.38 IU/mL (ref 0.30–0.70)

## 2021-04-26 MED ORDER — HEPARIN (PORCINE) 25000 UT/250ML-% IV SOLN: 1100.0000 [IU]/h | INTRAVENOUS | Status: DC

## 2021-04-26 MED ORDER — ALBUTEROL SULFATE HFA 108 (90 BASE) MCG/ACT IN AERS
2.0000 | INHALATION_SPRAY | RESPIRATORY_TRACT | Status: DC | PRN
  Filled 2021-04-26: qty 6.7

## 2021-04-26 MED ORDER — LOSARTAN POTASSIUM 50 MG PO TABS
100.0000 mg | ORAL_TABLET | ORAL | Status: DC
  Administered 2021-04-27 – 2021-04-29 (×3): 100 mg via ORAL
  Filled 2021-04-26 (×4): qty 2

## 2021-04-26 MED ORDER — AMLODIPINE BESYLATE 10 MG PO TABS
10.0000 mg | ORAL_TABLET | Freq: Every day | ORAL | Status: DC
  Administered 2021-04-27 – 2021-04-29 (×3): 10 mg via ORAL
  Filled 2021-04-26 (×3): qty 1

## 2021-04-26 MED ORDER — MIDAZOLAM HCL 2 MG/2ML IJ SOLN
INTRAMUSCULAR | Status: AC
  Filled 2021-04-26: qty 2

## 2021-04-26 MED ORDER — HEPARIN (PORCINE) 25000 UT/250ML-% IV SOLN
1000.0000 [IU]/h | INTRAVENOUS | Status: DC
  Administered 2021-04-27: 02:00:00 1100 [IU]/h via INTRAVENOUS
  Administered 2021-04-28: 950 [IU]/h via INTRAVENOUS
  Filled 2021-04-26 (×2): qty 250

## 2021-04-26 MED ORDER — AMLODIPINE BESYLATE 5 MG PO TABS
10.0000 mg | ORAL_TABLET | Freq: Every day | ORAL | Status: DC
  Administered 2021-04-26: 10 mg via ORAL
  Filled 2021-04-26: qty 2

## 2021-04-26 MED ORDER — SPIRONOLACTONE 25 MG PO TABS
25.0000 mg | ORAL_TABLET | Freq: Every day | ORAL | Status: DC
  Administered 2021-04-27 – 2021-04-29 (×3): 25 mg via ORAL
  Filled 2021-04-26 (×3): qty 1

## 2021-04-26 MED ORDER — HEPARIN BOLUS VIA INFUSION
4000.0000 [IU] | Freq: Once | INTRAVENOUS | Status: AC
  Administered 2021-04-26: 4000 [IU] via INTRAVENOUS
  Filled 2021-04-26: qty 4000

## 2021-04-26 MED ORDER — HEPARIN SODIUM (PORCINE) 1000 UNIT/ML IJ SOLN
INTRAMUSCULAR | Status: AC
  Filled 2021-04-26: qty 10

## 2021-04-26 MED ORDER — METOPROLOL TARTRATE 100 MG PO TABS
100.0000 mg | ORAL_TABLET | Freq: Two times a day (BID) | ORAL | Status: DC
  Administered 2021-04-26 – 2021-04-29 (×6): 100 mg via ORAL
  Filled 2021-04-26: qty 4
  Filled 2021-04-26: qty 1
  Filled 2021-04-26: qty 4
  Filled 2021-04-26 (×3): qty 1

## 2021-04-26 MED ORDER — LIDOCAINE HCL 1 % IJ SOLN
INTRAMUSCULAR | Status: AC | PRN
  Administered 2021-04-26: 10 mL

## 2021-04-26 MED ORDER — ASCORBIC ACID 500 MG PO TABS
1000.0000 mg | ORAL_TABLET | Freq: Every day | ORAL | Status: DC
  Administered 2021-04-27 – 2021-04-29 (×3): 1000 mg via ORAL
  Filled 2021-04-26 (×3): qty 2

## 2021-04-26 MED ORDER — SPIRONOLACTONE 25 MG PO TABS
25.0000 mg | ORAL_TABLET | Freq: Every day | ORAL | Status: DC
  Administered 2021-04-26: 25 mg via ORAL
  Filled 2021-04-26: qty 1

## 2021-04-26 MED ORDER — MIDAZOLAM HCL 2 MG/2ML IJ SOLN
INTRAMUSCULAR | Status: AC | PRN
  Administered 2021-04-26 (×2): .5 mg via INTRAVENOUS
  Administered 2021-04-26: 1 mg via INTRAVENOUS

## 2021-04-26 MED ORDER — HYDRALAZINE HCL 20 MG/ML IJ SOLN: 5.0000 mg | INTRAMUSCULAR | Status: DC | PRN

## 2021-04-26 MED ORDER — SODIUM ZIRCONIUM CYCLOSILICATE 10 G PO PACK
10.0000 g | PACK | Freq: Once | ORAL | Status: AC
  Administered 2021-04-26: 10 g via ORAL
  Filled 2021-04-26: qty 1

## 2021-04-26 MED ORDER — ADULT MULTIVITAMIN W/MINERALS CH
1.0000 | ORAL_TABLET | Freq: Every day | ORAL | Status: DC
  Administered 2021-04-27 – 2021-04-29 (×2): 1 via ORAL
  Filled 2021-04-26 (×3): qty 1

## 2021-04-26 MED ORDER — IOHEXOL 300 MG/ML  SOLN
50.0000 mL | Freq: Once | INTRAMUSCULAR | Status: AC | PRN
  Administered 2021-04-26: 45 mL via INTRA_ARTERIAL

## 2021-04-26 MED ORDER — ONDANSETRON HCL 4 MG/2ML IJ SOLN: 4.0000 mg | Freq: Three times a day (TID) | INTRAMUSCULAR | Status: DC | PRN

## 2021-04-26 MED ORDER — ALBUTEROL SULFATE (2.5 MG/3ML) 0.083% IN NEBU: 2.5000 mg | INHALATION_SOLUTION | RESPIRATORY_TRACT | Status: DC | PRN

## 2021-04-26 MED ORDER — POLYETHYLENE GLYCOL 3350 17 G PO PACK: 17.0000 g | PACK | Freq: Every day | ORAL | Status: DC | PRN

## 2021-04-26 MED ORDER — IOHEXOL 350 MG/ML SOLN
60.0000 mL | Freq: Once | INTRAVENOUS | Status: AC | PRN
  Administered 2021-04-26: 60 mL via INTRAVENOUS

## 2021-04-26 MED ORDER — DOCUSATE SODIUM 100 MG PO CAPS: 100.0000 mg | ORAL_CAPSULE | Freq: Every day | ORAL | Status: DC

## 2021-04-26 MED ORDER — ASCORBIC ACID 500 MG PO TABS
1000.0000 mg | ORAL_TABLET | Freq: Every day | ORAL | Status: DC
  Administered 2021-04-26: 1000 mg via ORAL
  Filled 2021-04-26: qty 2

## 2021-04-26 MED ORDER — OYSTER SHELL CALCIUM/D3 500-5 MG-MCG PO TABS
1.0000 | ORAL_TABLET | Freq: Every day | ORAL | Status: DC
Start: 2021-04-27 — End: 2021-04-29
  Administered 2021-04-29: 1 via ORAL
  Filled 2021-04-26 (×3): qty 1

## 2021-04-26 MED ORDER — FENTANYL CITRATE (PF) 100 MCG/2ML IJ SOLN
INTRAMUSCULAR | Status: AC | PRN
  Administered 2021-04-26 (×3): 25 ug via INTRAVENOUS

## 2021-04-26 MED ORDER — DM-GUAIFENESIN ER 30-600 MG PO TB12: 1.0000 | ORAL_TABLET | Freq: Two times a day (BID) | ORAL | Status: DC | PRN

## 2021-04-26 MED ORDER — CALCIUM + D3 600-200 MG-UNIT PO TABS: 1.0000 | ORAL_TABLET | Freq: Every day | ORAL | Status: DC

## 2021-04-26 MED ORDER — ADULT MULTIVITAMIN W/MINERALS CH
1.0000 | ORAL_TABLET | Freq: Every day | ORAL | Status: DC
  Administered 2021-04-26: 1 via ORAL
  Filled 2021-04-26: qty 1

## 2021-04-26 MED ORDER — HEPARIN (PORCINE) 25000 UT/250ML-% IV SOLN
1100.0000 [IU]/h | INTRAVENOUS | Status: DC
  Administered 2021-04-26: 1100 [IU]/h via INTRAVENOUS
  Filled 2021-04-26: qty 250

## 2021-04-26 MED ORDER — FENTANYL CITRATE (PF) 100 MCG/2ML IJ SOLN
INTRAMUSCULAR | Status: AC
  Filled 2021-04-26: qty 2

## 2021-04-26 MED ORDER — DOCUSATE SODIUM 100 MG PO CAPS: 100.0000 mg | ORAL_CAPSULE | Freq: Two times a day (BID) | ORAL | Status: DC | PRN

## 2021-04-26 MED ORDER — ALBUTEROL SULFATE HFA 108 (90 BASE) MCG/ACT IN AERS: 2.0000 | INHALATION_SPRAY | RESPIRATORY_TRACT | Status: DC | PRN

## 2021-04-26 MED ORDER — METOPROLOL TARTRATE 50 MG PO TABS
100.0000 mg | ORAL_TABLET | Freq: Two times a day (BID) | ORAL | Status: DC
  Administered 2021-04-26: 100 mg via ORAL
  Filled 2021-04-26: qty 2

## 2021-04-26 MED ORDER — OYSTER SHELL CALCIUM/D3 500-5 MG-MCG PO TABS
1.0000 | ORAL_TABLET | Freq: Every day | ORAL | Status: DC
  Filled 2021-04-26: qty 1

## 2021-04-26 MED ORDER — DOCUSATE SODIUM 100 MG PO CAPS
100.0000 mg | ORAL_CAPSULE | Freq: Every day | ORAL | Status: DC
  Administered 2021-04-26 – 2021-04-28 (×3): 100 mg via ORAL
  Filled 2021-04-26 (×3): qty 1

## 2021-04-26 MED ORDER — HEPARIN SODIUM (PORCINE) 1000 UNIT/ML IJ SOLN
INTRAMUSCULAR | Status: AC | PRN
  Administered 2021-04-26: 4000 [IU] via INTRAVENOUS

## 2021-04-26 MED ORDER — LIDOCAINE HCL 1 % IJ SOLN
INTRAMUSCULAR | Status: AC
  Filled 2021-04-26: qty 20

## 2021-04-26 MED ORDER — LOSARTAN POTASSIUM 50 MG PO TABS
100.0000 mg | ORAL_TABLET | ORAL | Status: DC
  Administered 2021-04-26: 100 mg via ORAL
  Filled 2021-04-26: qty 2

## 2021-04-26 MED ORDER — ACETAMINOPHEN 325 MG PO TABS: 650.0000 mg | ORAL_TABLET | Freq: Four times a day (QID) | ORAL | Status: DC | PRN

## 2021-04-26 NOTE — Discharge Summary (Addendum)
Physician Discharge Summary  Tara Ramos:[id number] DOB: 1932/03/03 DOA: 04/26/2021  PCP: Adin Hector, MD  Admit date: 04/26/2021 Discharge date: 04/26/2021  Recommendations for Outpatient Follow-up:  -Will be transferred to [facility] ICU  Home Health: none Equipment/Devices: none  Discharge Condition: stable, hemodynamically CODE STATUS: full Diet recommendation: NPO now  Brief/Interim Summary (HPI): Tara Ramos is a 87 y.o. female with medical history significant of breast cancer (s/p of radiation therapy, s/p of mastectomy), renal cell cancer (s/p of right nephrectomy and left ablation), CKD stage IIIa, CAD, PAF not on anticoagulants, hypertension, hyperlipidemia, who presents with shortness breath.   Nausea, patient states that she started having shortness breath since yesterday, which has been progressively worsening.  Patient has cough with clear mucus production.  No chest pain, no fever or chills.  Patient does not have nausea, vomiting, diarrhea or abdominal pain.  No symptoms of UTI.  Patient has a bilateral leg edema.   ED Course: pt was found to have WBC 15.9, troponin level 9-11, negative COVID PCR, stable renal function, potassium 5.2, temperature normal, blood pressure 156/92, heart rate 101-110, RR 28, oxygen desaturating to 81% on room air, currently on 4 L oxygen with saturation 94%, temperature normal.  Chest x-ray negative.  CT angiogram showed bilateral PE (right is worse than the left) with CT evidence of right heart strain.  Patient will be transferred to [facility] ICU.  Dr. Denna Haggard of IR and Dr. Ander Slade of PCCM were consulted.   Subjective  -SOB  Discharge Diagnoses and Hospital Course:   Principal Problem:   Acute pulmonary embolism ([name]) Active Problems:   HLD (hyperlipidemia)   Hypertension   CKD (chronic kidney disease), stage IIIa   PAF (paroxysmal atrial fibrillation) (HCC)   Acute respiratory failure with hypoxia (HCC)    Leukocytosis   Hyperkalemia   Thyroid nodule   Lung nodule   CAD (coronary artery disease)  Acute respiratory failure with hypoxia due to acute pulmonary embolism ([place]):  pt has acute bilateral PE with CT evidence of right heart strain.  She has oxygen desaturation to 81% on room air.  Now on 4 L oxygen which is new.  Patient still has tachycardia with heart rate 100-110s, and tachypnea with RR 29.  Patient is at high risk of deteriorating. I consulted vascular surgeon, Dr. Trula Slade who does not do thrombolectomy procedure, he recommended to consult interventional radiologist in [facility].  I consulted Dr. Denna Haggard who recommended to transfer patient to [facility] ICU.  He will do procedure after patient arrived to [facility]. I consulted Dr. Ander Slade of PCCM in [place] who has kindly accepted the patient to ICU in [facility]. I also discussed the case with Dr. Mortimer Fries of ICU here.    -Patient initially was admitted to progressive bed, now we will transfer patient to [facility] ICU -heparin drip initiated -2D echocardiogram ordered -LE dopplers ordered to evaluate for DVT -prn albuterol nebs and mucinex    HLD (hyperlipidemia): Patient not taking statin -Follow-up with PCP   HTN: -Amlodipine, losartan, metoprolol, spironolactone   CKD (chronic kidney disease), stage IIIa -stable -f/u with BMP   PAF (paroxysmal atrial fibrillation) ([place]) -continue metoprolol   Leukocytosis: WBC 15.9, no signs of infection.  No fever.  Likely reactive -Follow-up with CBC   Hyperkalemia: Potassium -Leukoma 10 g   Thyroid nodule and lung nodule: Incidental finding is by CT angiogram -Followed up with PCP   CAD (coronary artery disease): No  chest pain -Observe closely   Bilateral leg edema: Etiology is not clear -Follow-up 2D echo and lower extremity Doppler -Check BMP       Discharge Instructions   Allergies as of 04/26/2021       Reactions   Morphine And Related Nausea And  Vomiting   Lisinopril Cough   Tape Other (See Comments)   Blisters underneath; wide white hypofix tape post kidney biopsy      Med Rec must be completed prior to using this SMART[employer]       Allergies  Allergen Reactions   Morphine And Related Nausea And Vomiting   Lisinopril Cough   Tape Other (See Comments)    Blisters underneath; wide white hypofix tape post kidney biopsy     Consultations:   Dr. Denna Haggard of IR and Dr. Ander Slade of PCCM were consulted.    Procedures/Studies: DG Chest 2 View  Result Date: 04/26/2021 CLINICAL DATA:  Shortness of breath. EXAM: CHEST - 2 VIEW COMPARISON:  February 04, 2018 FINDINGS: Mild, stable left basilar linear scarring is seen. There is no evidence of acute infiltrate, pleural effusion or pneumothorax. The heart size and mediastinal contours are within normal limits. There is moderate to marked severity calcification of the aortic arch and tortuosity of the descending thoracic aorta. A chronic compression fracture deformity is again seen within the midthoracic spine. IMPRESSION: Stable left basilar linear scarring without acute or active cardiopulmonary disease. Electronically Signed   By: Virgina Norfolk M.D.   On: 04/26/2021 01:44   CT Angio Chest PE W and/or Wo Contrast  Result Date: 04/26/2021 CLINICAL DATA:  Pulmonary embolism (PE) suspected, high prob. Dyspnea, lower extremity edema. EXAM: CT ANGIOGRAPHY CHEST WITH CONTRAST TECHNIQUE: Multidetector CT imaging of the chest was performed using the standard protocol during bolus administration of intravenous contrast. Multiplanar CT image reconstructions and MIPs were obtained to evaluate the vascular anatomy. CONTRAST:  53mL OMNIPAQUE IOHEXOL 350 MG/ML SOLN COMPARISON:  None. FINDINGS: Cardiovascular: There is adequate opacification the pulmonary arterial tree. There is extensive intraluminal filling defect within the right pulmonary artery extending into all lobar and segmental pulmonary  arteries as well as within multiple segmental branches within the left upper and lower lobes in keeping with acute pulmonary embolism. The central pulmonary arteries are enlarged in keeping with pulmonary arterial hypertension. There is enlargement of the right heart with reversal of the normal RV/LV ratio (2.0). While the right ventricle appears mildly enlarged on prior examination, the degree of relative enlargement appears to of progressed in keeping with an element of acute right heart strain. Moderate coronary artery calcification. Mild global cardiomegaly. No pericardial effusion. Moderate atherosclerotic calcification within the thoracic aorta. No aortic aneurysm. Mediastinum/Nodes: Nodule within the inferior left thyroid lobe has enlarged since prior examination now measuring 19 mm on axial image # 62/6, not well characterized on this examination. No pathologic thoracic adenopathy. Esophagus is unremarkable. Lungs/Pleura: Scattered areas of airway impaction are noted within the lung bases, nonspecific. Mean 6 mm noncalcified pulmonary nodule is seen within the right upper lobe, axial image # 33/7, new since prior examination and indeterminate. The lungs are otherwise clear. No pneumothorax or pleural effusion. Central airways are widely patent. Upper Abdomen: Post believe changes noted within the left posterior pararenal space. No acute abnormality. Musculoskeletal: Remote midthoracic compression fracture noted. No acute bone abnormality. No lytic or blastic bone lesion identified. Review of the MIP images confirms the above findings. IMPRESSION: Positive for acute PE with CTevidence of right heart strain (  RV/LV Ratio = 2.0) consistent with at least submassive (intermediate risk) PE. The presence of right heart strain has been associated with an increased risk of morbidity and mortality. Moderate coronary artery calcification. 19 mm enlarging nodule within the left thyroid lobe inferiorly. Recommend thyroid  US (ref: J Am Coll Radiol. 2015 Feb;12(2): 143-50). 6 mm right solid pulmonary nodule within the upper lobe. Recommend a non-contrast Chest CT at 6-12 months. If patient is high risk for malignancy, recommend an additional non-contrast Chest CT at 18-24 months; if patient is low risk for malignancy a non-contrast Chest CT at 18-24 months is optional. These guidelines do not apply to immunocompromised patients and patients with cancer. Follow up in patients with significant comorbidities as clinically warranted. For lung cancer screening, adhere to Lung-RADS guidelines. Reference: Radiology. 2017; 284(1):228-43. These results were called by telephone at the time of interpretation on 04/26/2021 at 3:43 am to provider [facility] , who verbally acknowledged these results. Electronically Signed   By: Fidela Salisbury M.D.   On: 04/26/2021 03:52   MR KNEE RIGHT WO CONTRAST  Result Date: 04/02/2021 CLINICAL DATA:  Right knee pain with medial swelling x 6 weeks. EXAM: MRI OF THE RIGHT KNEE WITHOUT CONTRAST TECHNIQUE: Multiplanar, multisequence MR imaging of the knee was performed. No intravenous contrast was administered. COMPARISON:  None. FINDINGS: MENISCI Medial meniscus: There is fraying of the posterior root of the medial meniscus with possible nondisplaced degenerative tearing. Intrasubstance degenerative signal within the meniscal body. Lateral meniscus: Intact. LIGAMENTS Cruciates: Intact ACL and PCL. Collaterals: Periligamentous edema along the medial collateral ligament. The lateral collateral ligamentous complex is intact. CARTILAGE Patellofemoral: Mild partial-thickness cartilage loss of the medial and lateral patellar facets. Medial: Moderate partial-thickness cartilage loss with focal 4 x 6 mm chondral defect along the weight-bearing medial femoral condyle. Lateral:  Mild chondrosis.  No focal defect. Joint:  Small joint effusion. Popliteal Fossa:  Tiny Kroner cyst. Extensor Mechanism: Intact quadriceps  tendon and patellar tendon. Bones: There is a nondisplaced subchondral insufficiency fracture along the anteromedial tibial plateau, with extensive associated bony edema. Tricompartmental osteophyte formation. Other: Adjacent soft tissue edema along the medial proximal tibia. IMPRESSION: 1. Nondisplaced subchondral insufficiency fracture along the anteromedial tibial plateau, with extensive associated bony edema. 2. Tricompartmental osteoarthritis, worst in the medial compartment, with moderate partial-thickness cartilage loss and focal 4 x 6 mm chondral defect along the weight-bearing medial femoral condyle. 3. Fraying of the posterior root of the medial meniscus with possible nondisplaced degenerative tearing. 4. Grade 1 MCL sprain. 5. Small joint effusion.  Tiny Evitts cyst. Electronically Signed   By: Maurine Simmering M.D.   On: 04/02/2021 07:55      Discharge Exam: Vitals:   04/26/21 0900 04/26/21 1130  BP: (!) 148/102 (!) 132/94  Pulse: 91 (!) 108  Resp: (!) 27 (!) 32  Temp:    SpO2: 92% 92%   Vitals:   04/26/21 0540 04/26/21 0800 04/26/21 0900 04/26/21 1130  BP: (!) 142/88 (!) 156/92 (!) 148/102 (!) 132/94  Pulse: 91 (!) 101 91 (!) 108  Resp: 18 (!) 29 (!) 27 (!) 32  Temp:      SpO2: 91% 94% 92% 92%  Weight:      Height:        General: Not in acute distress HEENT:       Eyes: PERRL, EOMI, no scleral icterus.       ENT: No discharge from the ears and nose, no pharynx injection, no tonsillar enlargement.  Neck: No JVD, no bruit, no mass felt. Heme: No neck lymph node enlargement. Cardiac: S1/S2, RRR, No gallops or rubs. Respiratory: No rales, wheezing, rhonchi or rubs. GI: Soft, nondistended, nontender, no rebound pain, no organomegaly, BS present. GU: No hematuria Ext: has 1+ pitting leg edema bilaterally. 1+DP/PT pulse bilaterally. Musculoskeletal: No joint deformities, No joint redness or warmth, no limitation of ROM in spin. Skin: No rashes.  Neuro: Alert, oriented X3,  cranial nerves II-XII grossly intact, moves all extremities normally.  Psych: Patient is not psychotic, no suicidal or hemocidal ideation.     The results of significant diagnostics from this hospitalization (including imaging, microbiology, ancillary and laboratory) are listed below for reference.     Microbiology: Recent Results (from the past 240 hour(s))  Resp Panel by RT-PCR (Flu A&B, Covid) Nasopharyngeal Swab     Status: None   Collection Time: 04/26/21  1:39 AM   Specimen: Nasopharyngeal Swab; Nasopharyngeal(NP) swabs in vial transport medium  Result Value Ref Range Status   SARS Coronavirus 2 by RT PCR NEGATIVE NEGATIVE Final    Comment: (NOTE) SARS-CoV-2 target nucleic acids are NOT DETECTED.  The SARS-CoV-2 RNA is generally detectable in upper respiratory specimens during the acute phase of infection. The lowest concentration of SARS-CoV-2 viral copies this assay can detect is 138 copies/mL. A negative result does not preclude SARS-Cov-2 infection and should not be used as the sole basis for treatment or other patient management decisions. A negative result may occur with  improper specimen collection/handling, submission of specimen other than nasopharyngeal swab, presence of viral mutation(s) within the areas targeted by this assay, and inadequate number of viral copies(<138 copies/mL). A negative result must be combined with clinical observations, patient history, and epidemiological information. The expected result is Negative.  Fact Sheet for Patients:  EntrepreneurPulse.com.au  Fact Sheet for Healthcare Providers:  IncredibleEmployment.be  This test is no t yet approved or cleared by the [place] FDA and  has been authorized for detection and/or diagnosis of SARS-CoV-2 by FDA under an Emergency Use Authorization (EUA). This EUA will remain  in effect (meaning this test can be used) for the duration of the COVID-19  declaration under Section 564(b)(1) of the Act, 21 U.S.C.section 360bbb-3(b)(1), unless the authorization is terminated  or revoked sooner.       Influenza A by PCR NEGATIVE NEGATIVE Final   Influenza B by PCR NEGATIVE NEGATIVE Final    Comment: (NOTE) The Xpert Xpress SARS-CoV-2/FLU/RSV plus assay is intended as an aid in the diagnosis of influenza from Nasopharyngeal swab specimens and should not be used as a sole basis for treatment. Nasal washings and aspirates are unacceptable for Xpert Xpress SARS-CoV-2/FLU/RSV testing.  Fact Sheet for Patients: EntrepreneurPulse.com.au  Fact Sheet for Healthcare Providers: IncredibleEmployment.be  This test is not yet approved or cleared by the [place] FDA and has been authorized for detection and/or diagnosis of SARS-CoV-2 by FDA under an Emergency Use Authorization (EUA). This EUA will remain in effect (meaning this test can be used) for the duration of the COVID-19 declaration under Section 564(b)(1) of the Act, 21 U.S.C. section 360bbb-3(b)(1), unless the authorization is terminated or revoked.  Performed at [facility], [address] [place], [place] [address]      Labs: BNP (last 3 results) Recent Labs    04/26/21 0139  BNP 937.9*   Basic Metabolic Panel: Recent Labs  Lab 04/26/21 0139  NA 140  K 5.2*  CL 108  CO2 25  GLUCOSE  170*  BUN 22  CREATININE 1.29*  CALCIUM 9.9   Liver Function Tests: No results for input(s): AST, ALT, ALKPHOS, BILITOT, PROT, ALBUMIN in the last 168 hours. No results for input(s): LIPASE, AMYLASE in the last 168 hours. No results for input(s): AMMONIA in the last 168 hours. CBC: Recent Labs  Lab 04/26/21 0139  WBC 15.9*  NEUTROABS 14.1*  HGB 15.9*  HCT 48.7*  MCV 92.4  PLT 324   Cardiac Enzymes: No results for input(s): CKTOTAL, CKMB, CKMBINDEX, TROPONINI in the last 168 hours. BNP: Invalid input(s): POCBNP CBG: No  results for input(s): GLUCAP in the last 168 hours. D-Dimer No results for input(s): DDIMER in the last 72 hours. Hgb A1c No results for input(s): HGBA1C in the last 72 hours. Lipid Profile No results for input(s): CHOL, HDL, LDLCALC, TRIG, CHOLHDL, LDLDIRECT in the last 72 hours. Thyroid function studies No results for input(s): TSH, T4TOTAL, T3FREE, THYROIDAB in the last 72 hours.  Invalid input(s): FREET3 Anemia work up No results for input(s): VITAMINB12, FOLATE, FERRITIN, TIBC, IRON, RETICCTPCT in the last 72 hours. Urinalysis No results found for: COLORURINE, APPEARANCEUR, [place], [place], GLUCOSEU, [place], [place], [place], PROTEINUR, UROBILINOGEN, NITRITE, LEUKOCYTESUR Sepsis Labs Invalid input(s): PROCALCITONIN,  WBC,  LACTICIDVEN Microbiology Recent Results (from the past 240 hour(s))  Resp Panel by RT-PCR (Flu A&B, Covid) Nasopharyngeal Swab     Status: None   Collection Time: 04/26/21  1:39 AM   Specimen: Nasopharyngeal Swab; Nasopharyngeal(NP) swabs in vial transport medium  Result Value Ref Range Status   SARS Coronavirus 2 by RT PCR NEGATIVE NEGATIVE Final    Comment: (NOTE) SARS-CoV-2 target nucleic acids are NOT DETECTED.  The SARS-CoV-2 RNA is generally detectable in upper respiratory specimens during the acute phase of infection. The lowest concentration of SARS-CoV-2 viral copies this assay can detect is 138 copies/mL. A negative result does not preclude SARS-Cov-2 infection and should not be used as the sole basis for treatment or other patient management decisions. A negative result may occur with  improper specimen collection/handling, submission of specimen other than nasopharyngeal swab, presence of viral mutation(s) within the areas targeted by this assay, and inadequate number of viral copies(<138 copies/mL). A negative result must be combined with clinical observations, patient history, and epidemiological information. The expected result is  Negative.  Fact Sheet for Patients:  EntrepreneurPulse.com.au  Fact Sheet for Healthcare Providers:  IncredibleEmployment.be  This test is no t yet approved or cleared by the [place] FDA and  has been authorized for detection and/or diagnosis of SARS-CoV-2 by FDA under an Emergency Use Authorization (EUA). This EUA will remain  in effect (meaning this test can be used) for the duration of the COVID-19 declaration under Section 564(b)(1) of the Act, 21 U.S.C.section 360bbb-3(b)(1), unless the authorization is terminated  or revoked sooner.       Influenza A by PCR NEGATIVE NEGATIVE Final   Influenza B by PCR NEGATIVE NEGATIVE Final    Comment: (NOTE) The Xpert Xpress SARS-CoV-2/FLU/RSV plus assay is intended as an aid in the diagnosis of influenza from Nasopharyngeal swab specimens and should not be used as a sole basis for treatment. Nasal washings and aspirates are unacceptable for Xpert Xpress SARS-CoV-2/FLU/RSV testing.  Fact Sheet for Patients: EntrepreneurPulse.com.au  Fact Sheet for Healthcare Providers: IncredibleEmployment.be  This test is not yet approved or cleared by the [place] FDA and has been authorized for detection and/or diagnosis of SARS-CoV-2 by FDA under an Emergency Use Authorization (EUA). This EUA will remain  in effect (meaning this test can be used) for the duration of the COVID-19 declaration under Section 564(b)(1) of the Act, 21 U.S.C. section 360bbb-3(b)(1), unless the authorization is terminated or revoked.  Performed at [facility], [address] [place], [place] [address]     Time coordinating discharge:  28 minutes.   SIGNED:  Ivor Costa, MD [facility] 04/26/2021, 12:31 PM   If 7PM-7AM, please contact night-coverage [email or web address]

## 2021-04-26 NOTE — ED Notes (Signed)
Patient assisted to commode for BM.

## 2021-04-26 NOTE — ED Notes (Signed)
Echo at bedside

## 2021-04-26 NOTE — ED Provider Notes (Signed)
[facility] Emergency Department Provider Note  ____________________________________________   Event Date/Time   First MD Initiated Contact with Patient 04/26/21 0119     (approximate)  I have reviewed the triage vital signs and the nursing notes.   HISTORY  Chief Complaint Shortness of Breath    HPI Tara Ramos is a 87 y.o. female with history of breast cancer status post mastectomy in September 2021, renal cell carcinoma status post right-sided nephrectomy, hypertension, paroxysmal atrial fibrillation not on anticoagulation who presents to the emergency department with her daughter for concerns for 2 days of cough and shortness of breath.  Multiple family members with similar symptoms.  No known fevers.  No complaints of chest pain.  No vomiting or diarrhea.  No history of PE, DVT, CHF, asthma or COPD.  Does not wear oxygen chronically but sats were 81% on room air on arrival to the ED.  Patient has peripheral edema on exam but states that this is normal for her and unchanged.         Past Medical History:  Diagnosis Date   Arthritis    Breast cancer ([place]) 1992   right breast/radiation   Breast cancer ([place]) 1990   left breast/radiation   Breast cancer ([place]) 2014   right breast   Breast cancer, right breast ([place]) 12/16/2012   left, then right, then right breast cancers (lumpectomies and radiation therapy)   Chronic kidney disease    Dysrhythmia    HX A FIB - FOLLOWED BY [name]   GERD (gastroesophageal reflux disease)    Heart murmur    hx of years ago    Hemorrhoids    Hypertension    Personal history of radiation therapy    1990/1992   Renal cell cancer, right ([place]) 11/2014   Right Nephrectomy and Left renal ablation for lesion.    Renal insufficiency    Status post radiation therapy    breast cancer bilateral    Patient Active Problem List   Diagnosis Date Noted   History of renal cell cancer 02/15/2020   Myalgia 09/11/2019    Goals of care, counseling/discussion 08/09/2017   Coronary artery disease involving native coronary artery of native heart without angina pectoris 02/15/2017   Osteoporosis without current pathological fracture 09/03/2016   Age-related osteoporosis without current pathological fracture 02/16/2016   Aortic atherosclerosis ([place]) 01/16/2016   Pleural effusion on left 08/07/2015   History of breast cancer 08/07/2015   Malignant neoplasm of female breast ([place]) 07/15/2015   Cancer of left kidney ([place]) 05/24/2015   Left renal mass    Preop cardiovascular exam 05/06/2015   CKD (chronic kidney disease) stage 3, GFR 30-59 ml/min ([place]) 04/15/2015   Collapsed vertebra, not elsewhere classified, thoracic region, initial encounter for fracture ([place]) 04/14/2015   History of compression fracture of spine 04/14/2015   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) 02/02/2015   Renal mass 02/01/2015   Right renal mass 10/31/2014   Carcinoma in situ, breast, ductal 09/28/2014   GERD (gastroesophageal reflux disease) 09/28/2014   HLD (hyperlipidemia) 09/28/2014   Hypertension 09/28/2014   Osteopenia 09/28/2014    Past Surgical History:  Procedure Laterality Date   BREAST BIOPSY Right 03/10/2016   -  STROMAL SCLEROSIS, GIANT CELL REACTION, CHRONIC INFLAMMATION AND FAT    BREAST BIOPSY Right 12/12/2019   [place] bx of mass, vision marker, path pending   BREAST BIOPSY Right  12/12/2019   [place] bx of LN, hydromarker, path pending   BREAST BIOPSY Left 12/12/2019   stereo bx of calcs, coil marker, path pending   BREAST EXCISIONAL BIOPSY Right 2014   +   BREAST EXCISIONAL BIOPSY Right 1992   +   BREAST EXCISIONAL BIOPSY Left 1990   +   BREAST LUMPECTOMY Left 1990   BREAST LUMPECTOMY Right 1992   BROW LIFT Bilateral 11/10/2019   Procedure: BLEPHAROPLASTY UPPER EYELID; W/EXCESS SKIN BILATERAL & ECTROPION REPAIR, EXTENSIVE LEFT LOWER  LID;  Surgeon: Karle Starch, MD;  Location: [place];  Service: Ophthalmology;  Laterality: Bilateral;   DILATION AND CURETTAGE OF UTERUS     EYE SURGERY Bilateral    cataract extraction   KYPHOPLASTY N/A 10/09/2014   Procedure: KYPHOPLASTY;  Surgeon: Hessie Knows, MD;  Location: [facility];  Service: Orthopedics;  Laterality: N/A;  T7 Kyphoplasty with bone biopsy   MASTECTOMY MODIFIED RADICAL Right 01/08/2020   Procedure: MASTECTOMY MODIFIED RADICAL;  Surgeon: Robert Bellow, MD;  Location: [facility];  Service: General;  Laterality: Right;   OTHER SURGICAL HISTORY     radiation therapy breast   ROBOT ASSISTED LAPAROSCOPIC NEPHRECTOMY Right 02/01/2015   Procedure: ROBOTIC ASSISTED LAPAROSCOPIC RIGHT NEPHRECTOMY;  Surgeon: Cleon Gustin, MD;  Location: [facility];  Service: Urology;  Laterality: Right;   SIMPLE MASTECTOMY WITH AXILLARY SENTINEL NODE BIOPSY Left 01/08/2020   Procedure: SIMPLE MASTECTOMY;  Surgeon: Robert Bellow, MD;  Location: [facility];  Service: General;  Laterality: Left;    Prior to Admission medications   Medication Sig Start Date End Date Taking? Authorizing Provider  amLODipine (NORVASC) 10 MG tablet Take 10 mg by mouth daily. 04/17/21  Yes [provider]  Ascorbic Acid (VITAMIN C) 1000 MG tablet Take 1,000 mg by mouth daily.    Yes [provider]  Calcium Carb-Cholecalciferol (CALCIUM + D3) 600-200 MG-UNIT TABS Take 1 tablet by mouth daily.    Yes [provider]  docusate sodium (COLACE) 100 MG capsule Take 100 mg by mouth at bedtime.   Yes [provider]  losartan (COZAAR) 100 MG tablet Take 100 mg by mouth every morning.  02/10/19  Yes [provider]  metoprolol (LOPRESSOR) 100 MG tablet Take 100 mg by mouth 2 (two) times daily.   Yes [provider]  Multiple Vitamin (MULTIVITAMIN WITH MINERALS) TABS tablet Take 1 tablet by mouth daily.   Yes [provider]  Multiple Vitamins-Minerals  (PRESERVISION AREDS 2 PO) Take 1 capsule by mouth in the morning and at bedtime.   Yes [provider]  spironolactone (ALDACTONE) 25 MG tablet Take 25 mg by mouth daily. 01/23/21  Yes [provider]  amLODipine (NORVASC) 5 MG tablet Take 7.5 mg by mouth every morning. Patient not taking: Reported on 04/26/2021    [provider]    Allergies Morphine and related, Lisinopril, and Tape  Family History  Problem Relation Age of Onset   Stomach cancer Mother    Stroke Father    Brain cancer Sister    Breast cancer Daughter 33   Bladder Cancer Neg Hx    Prostate cancer Neg Hx    Kidney cancer Neg Hx     Social History Social History   Tobacco Use   Smoking status: Former    Years: 20.00    Types: Cigarettes    Quit date: 04/27/1972    Years since quitting: 49.0   Smokeless tobacco: Never  Substance Use  Topics   Alcohol use: Yes    Alcohol/week: 1.0 standard drink    Types: 1 Glasses of wine per week    Comment: 1 glass of wine with dinner occ   Drug use: No    Review of Systems Constitutional: No fever. Eyes: No visual changes. ENT: No sore throat. Cardiovascular: Denies chest pain. Respiratory: + shortness of breath. Gastrointestinal: No nausea, vomiting, diarrhea. Genitourinary: Negative for dysuria. Musculoskeletal: Negative for back pain. Skin: Negative for rash. Neurological: Negative for focal weakness or numbness.  ____________________________________________   PHYSICAL EXAM:  VITAL SIGNS: ED Triage Vitals  Enc Vitals Group     BP 04/26/21 0107 (!) 153/113     Pulse Rate 04/26/21 0107 99     Resp 04/26/21 0107 (!) 22     Temp 04/26/21 0107 98.5 F (36.9 C)     Temp src --      SpO2 04/26/21 0107 (!) 81 %     Weight 04/26/21 0108 163 lb (73.9 kg)     Height 04/26/21 0108 5\' 2"  (1.575 m)     Head Circumference --      Peak Flow --      Pain Score 04/26/21 0108 0     Pain Loc --      Pain Edu? --      Excl. in [place]? --     CONSTITUTIONAL: Alert and oriented and responds appropriately to questions. Well-appearing; well-nourished, elderly, afebrile HEAD: Normocephalic EYES: Conjunctivae clear, pupils appear equal, EOM appear intact ENT: normal nose; moist mucous membranes NECK: Supple, normal ROM CARD: Irregularly irregular and rate controlled; S1 and S2 appreciated; no murmurs, no clicks, no rubs, no gallops RESP: Normal chest excursion without splinting or tachypnea; breath sounds clear and equal bilaterally; no wheezes, no rhonchi, no rales, patient is hypoxic on room air at rest ABD/GI: Normal bowel sounds; non-distended; soft, non-tender, no rebound, no guarding, no peritoneal signs, no hepatosplenomegaly BACK: The back appears normal EXT: Normal ROM in all joints; no deformity noted, nonpitting edema in bilateral distal lower extremities, no calf tenderness or calf swelling SKIN: Normal color for age and race; warm; no rash on exposed skin NEURO: Moves all extremities equally PSYCH: The patient's mood and manner are appropriate.  ____________________________________________   LABS (all labs ordered are listed, but only abnormal results are displayed)  Labs Reviewed  CBC WITH DIFFERENTIAL/PLATELET - Abnormal; Notable for the following components:      Result Value   WBC 15.9 (*)    RBC 5.27 (*)    Hemoglobin 15.9 (*)    HCT 48.7 (*)    Neutro Abs 14.1 (*)    Abs Immature Granulocytes 0.11 (*)    All other components within normal limits  BASIC METABOLIC PANEL - Abnormal; Notable for the following components:   Potassium 5.2 (*)    Glucose, Bld 170 (*)    Creatinine, Ser 1.29 (*)    GFR, Estimated 40 (*)    All other components within normal limits  BRAIN NATRIURETIC PEPTIDE - Abnormal; Notable for the following components:   B Natriuretic Peptide 337.5 (*)    All other components within normal limits  RESP PANEL BY RT-PCR (FLU A&B, COVID) ARPGX2  RESPIRATORY PANEL BY PCR  TROPONIN I (HIGH  SENSITIVITY)  TROPONIN I (HIGH SENSITIVITY)   ____________________________________________  EKG   EKG Interpretation  Date/Time:  Saturday April 26 2021 01:14:46 EST Ventricular Rate:  97 PR Interval:    QRS Duration: 84 QT Interval:  380 QTC Calculation: 482 R Axis:   9 Text Interpretation: Atrial fibrillation Inferior infarct , age undetermined Anterior infarct (cited on or before 01-Feb-2015) Abnormal ECG When compared with ECG of 06-Nov-2019 11:14, Significant changes have occurred Confirmed by Pryor Curia [phone]) on 04/26/2021 1:39:12 AM        ____________________________________________  RADIOLOGY Jessie [place] Defina, personally viewed and evaluated these images (plain radiographs) as part of my medical decision making, as well as reviewing the written report by the radiologist.  ED MD interpretation: Chest x-ray clear.  CT chest shows bilateral PE.  Official radiology report(s): DG Chest 2 View  Result Date: 04/26/2021 CLINICAL DATA:  Shortness of breath. EXAM: CHEST - 2 VIEW COMPARISON:  February 04, 2018 FINDINGS: Mild, stable left basilar linear scarring is seen. There is no evidence of acute infiltrate, pleural effusion or pneumothorax. The heart size and mediastinal contours are within normal limits. There is moderate to marked severity calcification of the aortic arch and tortuosity of the descending thoracic aorta. A chronic compression fracture deformity is again seen within the midthoracic spine. IMPRESSION: Stable left basilar linear scarring without acute or active cardiopulmonary disease. Electronically Signed   By: Virgina Norfolk M.D.   On: 04/26/2021 01:44    ____________________________________________   PROCEDURES  Procedure(s) performed (including Critical Care):  Procedures  CRITICAL CARE Performed by: Cyril Mourning Summer Parthasarathy   Total critical care time: 65 minutes  Critical care time was exclusive of separately billable procedures and treating other  patients.  Critical care was necessary to treat or prevent imminent or life-threatening deterioration.  Critical care was time spent personally by me on the following activities: development of treatment plan with patient and/or surrogate as well as nursing, discussions with consultants, evaluation of patient's response to treatment, examination of patient, obtaining history from patient or surrogate, ordering and performing treatments and interventions, ordering and review of laboratory studies, ordering and review of radiographic studies, pulse oximetry and re-evaluation of patient's condition.  ____________________________________________   INITIAL IMPRESSION / ASSESSMENT AND PLAN / ED COURSE  As part of my medical decision making, I reviewed the following data within the [place] History obtained from family, Nursing notes reviewed and incorporated, Labs reviewed , EKG interpreted , Old EKG reviewed, Old chart reviewed, Radiograph reviewed , Discussed with admitting physician , Discussed with radiologist, CT reviewed, and Notes from prior ED visits         Patient here with symptoms of cough for the past couple of days with multiple family members with similar urinary symptoms.  Has had a negative COVID test at home.  No fever here but was hypoxic to 81% on room air at rest.  Does not wear oxygen chronically.  Differential includes pneumonia, viral illness, PE, CHF, pneumothorax, ACS.  I feel patient will need admission to the hospital.  Labs, chest x-ray pending.  We will also obtain COVID and flu swabs as well as a respiratory viral panel.  ED PROGRESS  Patient's chest x-ray is unremarkable.  Given her significant hypoxia, will proceed with CTA of the chest to evaluate for PE.  COVID and flu negative.  Troponin normal.  BNP minimally elevated at 337.  She does have a leukocytosis of 15,000 but is afebrile here.   3:45 AM  Pt's CTA shows bilateral pulmonary emboli  that appear acute.  Radiologist states the patient does have some signs of right heart strain that appear to be acute on chronic.  We will start IV heparin.  At this time she is hemodynamically stable and doing well on 4 L nasal cannula.  I feel she is appropriate for the hospitalist service and does not acutely need the ICU.  Will discuss with medicine for admission.  3:59 AM Discussed patient's case with hospitalist, Dr. Hal Hope.  I have recommended admission and patient (and family if present) agree with this plan. Admitting physician will place admission orders.   I reviewed all nursing notes, vitals, pertinent previous records and reviewed/interpreted all EKGs, lab and urine results, imaging (as available).  ____________________________________________   FINAL CLINICAL IMPRESSION(S) / ED DIAGNOSES  Final diagnoses:  Acute respiratory failure with hypoxia (HCC)  Bilateral pulmonary embolism (HCC)  Thyroid nodule     ED Discharge Orders     None       *Please note:  MIMA CRANMORE was evaluated in Emergency Department on 04/26/2021 for the symptoms described in the history of present illness. She was evaluated in the context of the global COVID-19 pandemic, which necessitated consideration that the patient might be at risk for infection with the SARS-CoV-2 virus that causes COVID-19. Institutional protocols and algorithms that pertain to the evaluation of patients at risk for COVID-19 are in a state of rapid change based on information released by regulatory bodies including the CDC and federal and state organizations. These policies and algorithms were followed during the patient's care in the ED.  Some ED evaluations and interventions may be delayed as a result of limited staffing during and the pandemic.*   Note:  This document was prepared using Dragon voice recognition software and may include unintentional dictation errors.    Kamyla Olejnik, Delice Bison, DO 04/26/21 [phone]

## 2021-04-26 NOTE — ED Triage Notes (Signed)
Pt c/o SOB since yesterday afternoon. Pt has BLE edema. Denies hx of HCF or COPD. Pt's O2 81% on RA in triage. Pt placed on 2lpm

## 2021-04-26 NOTE — ED Notes (Signed)
Patient resting with eyes closed. Respirations even and unlabored. NAD noted. 

## 2021-04-26 NOTE — Progress Notes (Signed)
ANTICOAGULATION CONSULT NOTE  Pharmacy Consult for heparin infusion Indication: pulmonary embolus  Allergies  Allergen Reactions   Morphine And Related Nausea And Vomiting   Lisinopril Cough   Tape Other (See Comments)    Blisters underneath; wide white hypofix tape post kidney biopsy     Patient Measurements: Height: 5\' 2"  (157.5 cm) Weight: 73.9 kg (163 lb) IBW/kg (Calculated) : 50.1 Heparin Dosing Weight: 66 kg  Vital Signs: Temp: 98.5 F (36.9 C) (12/31 0107) BP: 147/119 (12/31 0337) Pulse Rate: 91 (12/31 0337)  Labs: Recent Labs    04/26/21 0139  HGB 15.9*  HCT 48.7*  PLT 324  CREATININE 1.29*  TROPONINIHS 9    Estimated Creatinine Clearance: 28.9 mL/min (A) (by C-G formula based on SCr of 1.29 mg/dL (H)).   Medical History: Past Medical History:  Diagnosis Date   Arthritis    Breast cancer ([place]) 1992   right breast/radiation   Breast cancer ([place]) 1990   left breast/radiation   Breast cancer ([place]) 2014   right breast   Breast cancer, right breast ([place]) 12/16/2012   left, then right, then right breast cancers (lumpectomies and radiation therapy)   Chronic kidney disease    Dysrhythmia    HX A FIB - FOLLOWED BY [name]   GERD (gastroesophageal reflux disease)    Heart murmur    hx of years ago    Hemorrhoids    Hypertension    Personal history of radiation therapy    1990/1992   Renal cell cancer, right ([place]) 11/2014   Right Nephrectomy and Left renal ablation for lesion.    Renal insufficiency    Status post radiation therapy    breast cancer bilateral    Assessment: Pt is 87 yo female presenting to ED c/o SOB, now being treated for suspected PE.  Goal of Therapy:  Heparin level 0.3-0.7 units/ml Monitor platelets by anticoagulation protocol: Yes   Plan:  Bolus 4000 units x 1 Start heparin infusion at 1100 units/hr Check HL in 8 hr after start of infusion CBC daily while on heparin.  Renda Rolls, PharmD, MBA 04/26/2021 4:11  AM

## 2021-04-26 NOTE — Discharge Instructions (Signed)
You were cared for by a hospitalist during your hospital stay. If you have any questions about your discharge medications or the care you received while you were in the hospital after you are discharged, you can call the unit and ask to speak with the hospitalist on call if the hospitalist that took care of you is not available.    You will be transferred to [facility] ICU for thrombolectomy procedure

## 2021-04-26 NOTE — ED Notes (Signed)
Pt resting with eyes closed respirations even and unlabored at this time.

## 2021-04-26 NOTE — H&P (Signed)
Interventional Radiology - Pre-procedure H&P    Referring Provider: Laurin Coder, MD  Planned Procedure: PE thrombectomy     History of Present Illness  Tara Ramos is a 87 y.o. female seen today for bilateral PE. She began to feel SOB with cough on Thursday and attributed it to a cold, but continued to feel worse throughout yesterday causing her to present to the [facility] ED where she was diagnosed with bilateral R>L PE on CTA chest. US DVT show nonocclusive thrombus of the left popliteal vein.   She currently sitting up in bed on 4L O2. She is tachycardic but normotensive. She is able to talk in complete sentences, but is unable to get out of bed and perform any activities without feeling extremely SOB. She denies dizziness or lightheadedness or chest pain. She states 'she can't go on like this.'    Additional Past Medical History Past Medical History:  Diagnosis Date   Arthritis    Breast cancer ([place]) 1992   right breast/radiation   Breast cancer ([place]) 1990   left breast/radiation   Breast cancer ([place]) 2014   right breast   Breast cancer, right breast ([place]) 12/16/2012   left, then right, then right breast cancers (lumpectomies and radiation therapy)   Chronic kidney disease    Dysrhythmia    HX A FIB - FOLLOWED BY [name]   GERD (gastroesophageal reflux disease)    Heart murmur    hx of years ago    Hemorrhoids    Hypertension    Personal history of radiation therapy    1990/1992   Renal cell cancer, right ([place]) 11/2014   Right Nephrectomy and Left renal ablation for lesion.    Renal insufficiency    Status post radiation therapy    breast cancer bilateral    Surgical History  Past Surgical History:  Procedure Laterality Date   BREAST BIOPSY Right 03/10/2016   -  STROMAL SCLEROSIS, GIANT CELL REACTION, CHRONIC INFLAMMATION AND FAT    BREAST BIOPSY Right 12/12/2019   [place] bx of mass, vision marker, path pending   BREAST BIOPSY Right 12/12/2019   [place] bx of LN,  hydromarker, path pending   BREAST BIOPSY Left 12/12/2019   stereo bx of calcs, coil marker, path pending   BREAST EXCISIONAL BIOPSY Right 2014   +   BREAST EXCISIONAL BIOPSY Right 1992   +   BREAST EXCISIONAL BIOPSY Left 1990   +   BREAST LUMPECTOMY Left 1990   BREAST LUMPECTOMY Right 1992   BROW LIFT Bilateral 11/10/2019   Procedure: BLEPHAROPLASTY UPPER EYELID; W/EXCESS SKIN BILATERAL & ECTROPION REPAIR, EXTENSIVE LEFT LOWER LID;  Surgeon: Karle Starch, MD;  Location: [place];  Service: Ophthalmology;  Laterality: Bilateral;   DILATION AND CURETTAGE OF UTERUS     EYE SURGERY Bilateral    cataract extraction   KYPHOPLASTY N/A 10/09/2014   Procedure: KYPHOPLASTY;  Surgeon: Hessie Knows, MD;  Location: [facility];  Service: Orthopedics;  Laterality: N/A;  T7 Kyphoplasty with bone biopsy   MASTECTOMY MODIFIED RADICAL Right 01/08/2020   Procedure: MASTECTOMY MODIFIED RADICAL;  Surgeon: Robert Bellow, MD;  Location: [facility];  Service: General;  Laterality: Right;   OTHER SURGICAL HISTORY     radiation therapy breast   ROBOT ASSISTED LAPAROSCOPIC NEPHRECTOMY Right 02/01/2015   Procedure: ROBOTIC ASSISTED LAPAROSCOPIC RIGHT NEPHRECTOMY;  Surgeon: Cleon Gustin, MD;  Location: [facility];  Service: Urology;  Laterality: Right;   SIMPLE MASTECTOMY WITH AXILLARY  SENTINEL NODE BIOPSY Left 01/08/2020   Procedure: SIMPLE MASTECTOMY;  Surgeon: Robert Bellow, MD;  Location: [facility];  Service: General;  Laterality: Left;     Medications  I have reviewed the current medication list. Refer to chart for details. Current Outpatient Medications  Medication Instructions   amLODipine (NORVASC) 7.5 mg, BH-each morning   amLODipine (NORVASC) 10 mg, Oral, Daily   Calcium Carb-Cholecalciferol (CALCIUM + D3) 600-200 MG-UNIT TABS 1 tablet, Oral, Daily   docusate sodium (COLACE) 100 mg, Oral, Daily at bedtime   losartan (COZAAR) 100 mg, Oral, BH-each morning   metoprolol tartrate  (LOPRESSOR) 100 mg, 2 times daily   Multiple Vitamin (MULTIVITAMIN WITH MINERALS) TABS tablet 1 tablet, Oral, Daily   Multiple Vitamins-Minerals (PRESERVISION AREDS 2 PO) 1 capsule, Oral, 2 times daily   spironolactone (ALDACTONE) 25 mg, Oral, Daily   vitamin C 1,000 mg, Oral, Daily     Allergies Allergies  Allergen Reactions   Morphine And Related Nausea And Vomiting   Lisinopril Cough   Tape Other (See Comments)    Blisters underneath; wide white hypofix tape post kidney biopsy    Does patient have contrast allergy: No     Physical Exam Current Vitals   ( )   Pulse Rate: 97   Resp: (!) 21   BP: (!) 137/91   SpO2: 93 %       Weight: 74.7 kg   Body mass index is 30.12 kg/m.  General: Alert and answers questions appropriately. No apparent distress. HEENT: Normocephalic, atraumatic. Conjunctivae normal without scleral icterus. Mallampati score: II (hard and soft palate, upper portion of tonsils anduvula visible) Cardiac: Tachycardic. No dependent edema. Pulmonary: on 4L [place], slightly increased WOB.  Abdominal: Soft without distension. Extremities: Normally-formed, well perfused.    Pertinent Lab Results Labs: CBC: WBC/Hgb/Hct/Plts:  15.9/15.9/48.7/324 (12/31 0139)  BMP: BUN/Cr/glu/ALT/AST/amyl/lip:  22/1.29/--/--/--/--/-- (12/31 0139) Coagulation: PT/INR/PTT:  --/1.1/-- (12/31 0725)  CBC Trends: Recent Labs    04/26/21 0139  WBC 15.9*  HGB 15.9*  HCT 48.7*  PLT 324     Creatinine Trend: Recent Labs    04/26/21 0139  CREATININE 1.29*     Relevant and/or Recent Imaging: CTPA 12/30 - predominantly right sided PE woith right heard strain  DVT US 12/30 - left nonocclussive DVT, no right DVT    Assessment & Plan Tara MATCHETT is a 87 y.o. female who presents with acute bilateral PE. Based on CT evidence of right heart strain however with negative biomarkers (troponin) and normal BP, she is intermediate-low risk PE by ESC criteria. These patients may benefit from  catheter directed therapy. I discussed the large volume right-sided PE, her symptoms with activity, and possibility of right heart failure with prolonged strain, and I think she would benefit from a procedure. She is agreeable to proceed after discussion or risks and benefits.   Patient is appropriate candidate for cathter directed thrombectomy with [place]. Risks and benefits discussed, including but not limited to procedure-specific risks of damage to the heart or veins, unstable arrhythmia, and possible death, and patient is agreeable to proceed.    Procedure Checklist:  Consent obtained: Risks of the procedure as well as the alternatives and risks of each were explained to the patient and/or caregiver.  Consent for the procedure was obtained and is signed in the bedside chart Consent obtained from: The patient Patient is appropriate candidate for sedation Yes ASA Classification: ASA 3 - Patient with moderate systemic disease with functional limitations NPO status:  0000  Code status:   Code Status: Prior Pre-procedural prep necessary: none     I spent a total of 40 Minutes  in face-to-face in clinical consultation, greater than 50% of which was spent on medical decision-making and counseling/coordinating care for pulmonary embolism.     Albin Felling, MD  Vascular and Interventional Radiology 04/26/2021 3:02 PM

## 2021-04-26 NOTE — Progress Notes (Signed)
ANTICOAGULATION CONSULT NOTE - Initial Consult  Pharmacy Consult for IV Heparin (transferred on therapy from [facility]) Indication: pulmonary embolus  Allergies  Allergen Reactions   Morphine And Related Nausea And Vomiting   Lisinopril Cough   Tape Other (See Comments)    Blisters underneath; wide white hypofix tape post kidney biopsy     Patient Measurements: Weight: 74.7 kg (164 lb 10.9 oz) Heparin Dosing Weight: 66 kg  Vital Signs: Temp: 97.8 F (36.6 C) (12/31 1317) BP: 150/114 (12/31 1823) Pulse Rate: 87 (12/31 1823)  Labs: Recent Labs    04/26/21 0139 04/26/21 0334 04/26/21 0725 04/26/21 1552  HGB 15.9*  --   --   --   HCT 48.7*  --   --   --   PLT 324  --   --   --   APTT  --   --  118*  --   LABPROT  --   --  14.1  --   INR  --   --  1.1  --   HEPARINUNFRC  --   --   --  0.38  CREATININE 1.29*  --   --   --   TROPONINIHS 9 11  --   --      Estimated Creatinine Clearance: 29.1 mL/min (A) (by C-G formula based on SCr of 1.29 mg/dL (H)).   Medical History: Past Medical History:  Diagnosis Date   Arthritis    Breast cancer ([place]) 1992   right breast/radiation   Breast cancer ([place]) 1990   left breast/radiation   Breast cancer ([place]) 2014   right breast   Breast cancer, right breast ([place]) 12/16/2012   left, then right, then right breast cancers (lumpectomies and radiation therapy)   Chronic kidney disease    Dysrhythmia    HX A FIB - FOLLOWED BY [name]   GERD (gastroesophageal reflux disease)    Heart murmur    hx of years ago    Hemorrhoids    Hypertension    Personal history of radiation therapy    1990/1992   Renal cell cancer, right ([place]) 11/2014   Right Nephrectomy and Left renal ablation for lesion.    Renal insufficiency    Status post radiation therapy    breast cancer bilateral    Medications:  Infusions:   heparin      Assessment: 87 years of age female with acute large pulmonary embolism who was initiated on IV Heparin ~5 AM at  [facility] then transferred for catheter directed lysis. IV heparin currently running at 1100 units/hr (11 ml/hr). Running well without issue. No bleeding noted. No levels have yet to be drawn. Now s/p lysis in IR this afternoon. Orders to continue heparin for now.   Heparin level at goal drawn prior to IR thrombolysis. Will continue at current rate.   Goal of Therapy:  Heparin level 0.3-0.7 units/ml Monitor platelets by anticoagulation protocol: Yes   Plan:  Continue IV Heparin at 1100 units/hr (11 ml/hr). Daily heparin level and cbc  Erin Hearing PharmD., BCPS Clinical Pharmacist 04/26/2021 9:28 PM

## 2021-04-26 NOTE — H&P (Addendum)
Tara Ramos    Tara Ramos ZOX:[id number] DOB: 1931/10/26 DOA: 04/26/2021  Referring MD/NP/PA:   PCP: Tara Hector, MD   Patient coming from:  The patient is coming from home.  At baseline, pt is independent for most of ADL.        Chief Complaint: SOB  HPI: Tara Ramos is a 86 y.o. female with medical history significant of breast cancer (s/p of radiation therapy, s/p of mastectomy), renal cell cancer (s/p of right nephrectomy and left ablation), CKD stage IIIa, CAD, PAF not on anticoagulants, hypertension, hyperlipidemia, who presents with shortness breath.  Nausea, patient states that she started having shortness breath since yesterday, which has been progressively worsening.  Patient has cough with clear mucus production.  No chest pain, no fever or chills.  Patient does not have nausea, vomiting, diarrhea or abdominal pain.  No symptoms of UTI.  Patient has a bilateral leg edema.  ED Course: pt was found to have WBC 15.9, troponin level 9-11, negative COVID PCR, stable renal function, potassium 5.2, temperature normal, blood pressure 156/92, heart rate 101-110, RR 28, oxygen desaturating to 81% on room air, currently on 4 L oxygen with saturation 94%, temperature normal.  Chest x-ray negative.  CT angiogram showed bilateral PE (right is worse than the left) with CT evidence of right heart strain.  Patient will be transferred to [facility] ICU.  Dr. Denna Ramos of IR and Dr. Ander Ramos of PCCM were consulted.   Review of Systems:   General: no fevers, chills, no body weight gain, has fatigue HEENT: no blurry vision, hearing changes or sore throat Respiratory: has dyspnea, coughing, no wheezing CV: no chest pain, no palpitations GI: no nausea, vomiting, abdominal pain, diarrhea, constipation GU: no dysuria, burning on urination, increased urinary frequency, hematuria  Ext: has leg edema Neuro: no unilateral weakness, numbness, or tingling, no vision change or  hearing loss Skin: no rash, no skin tear. MSK: No muscle spasm, no deformity, no limitation of range of movement in spin Heme: No easy bruising.  Travel history: No recent long distant travel.  Allergy:  Allergies  Allergen Reactions   Morphine And Related Nausea And Vomiting   Lisinopril Cough   Tape Other (See Comments)    Blisters underneath; wide white hypofix tape post kidney biopsy     Past Medical History:  Diagnosis Date   Arthritis    Breast cancer ([place]) 1992   right breast/radiation   Breast cancer ([place]) 1990   left breast/radiation   Breast cancer ([place]) 2014   right breast   Breast cancer, right breast ([place]) 12/16/2012   left, then right, then right breast cancers (lumpectomies and radiation therapy)   Chronic kidney disease    Dysrhythmia    HX A FIB - FOLLOWED BY DR. Fletcher Ramos   GERD (gastroesophageal reflux disease)    Heart murmur    hx of years ago    Hemorrhoids    Hypertension    Personal history of radiation therapy    1990/1992   Renal cell cancer, right ([place]) 11/2014   Right Nephrectomy and Left renal ablation for lesion.    Renal insufficiency    Status post radiation therapy    breast cancer bilateral    Past Surgical History:  Procedure Laterality Date   BREAST BIOPSY Right 03/10/2016   -  STROMAL SCLEROSIS, GIANT CELL REACTION, CHRONIC INFLAMMATION AND FAT    BREAST BIOPSY Right 12/12/2019   [place] bx of mass,  vision marker, path pending   BREAST BIOPSY Right 12/12/2019   [place] bx of LN, hydromarker, path pending   BREAST BIOPSY Left 12/12/2019   stereo bx of calcs, coil marker, path pending   BREAST EXCISIONAL BIOPSY Right 2014   +   BREAST EXCISIONAL BIOPSY Right 1992   +   BREAST EXCISIONAL BIOPSY Left 1990   +   BREAST LUMPECTOMY Left 1990   BREAST LUMPECTOMY Right 1992   BROW LIFT Bilateral 11/10/2019   Procedure: BLEPHAROPLASTY UPPER EYELID; W/EXCESS SKIN BILATERAL & ECTROPION REPAIR, EXTENSIVE LEFT LOWER LID;  Surgeon: Karle Starch,  MD;  Location: [place];  Service: Ophthalmology;  Laterality: Bilateral;   DILATION AND CURETTAGE OF UTERUS     EYE SURGERY Bilateral    cataract extraction   KYPHOPLASTY N/A 10/09/2014   Procedure: KYPHOPLASTY;  Surgeon: Hessie Knows, MD;  Location: [facility];  Service: Orthopedics;  Laterality: N/A;  T7 Kyphoplasty with bone biopsy   MASTECTOMY MODIFIED RADICAL Right 01/08/2020   Procedure: MASTECTOMY MODIFIED RADICAL;  Surgeon: Robert Bellow, MD;  Location: [facility];  Service: General;  Laterality: Right;   OTHER SURGICAL HISTORY     radiation therapy breast   ROBOT ASSISTED LAPAROSCOPIC NEPHRECTOMY Right 02/01/2015   Procedure: ROBOTIC ASSISTED LAPAROSCOPIC RIGHT NEPHRECTOMY;  Surgeon: Cleon Gustin, MD;  Location: [facility];  Service: Urology;  Laterality: Right;   SIMPLE MASTECTOMY WITH AXILLARY SENTINEL NODE BIOPSY Left 01/08/2020   Procedure: SIMPLE MASTECTOMY;  Surgeon: Robert Bellow, MD;  Location: [facility];  Service: General;  Laterality: Left;    Social History:  reports that she quit smoking about 49 years ago. Her smoking use included cigarettes. She has never used smokeless tobacco. She reports current alcohol use of about 1.0 standard drink per week. She reports that she does not use drugs.  Family History:  Family History  Problem Relation Age of Onset   Stomach cancer Mother    Stroke Father    Brain cancer Sister    Breast cancer Daughter 56   Bladder Cancer Neg Hx    Prostate cancer Neg Hx    Kidney cancer Neg Hx      Prior to Admission medications   Medication Sig Start Date End Date Taking? Authorizing Provider  amLODipine (NORVASC) 10 MG tablet Take 10 mg by mouth daily. 04/17/21  Yes [provider]  Ascorbic Acid (VITAMIN C) 1000 MG tablet Take 1,000 mg by mouth daily.    Yes [provider]  Calcium Carb-Cholecalciferol (CALCIUM + D3) 600-200 MG-UNIT TABS Take 1 tablet by mouth daily.    Yes [provider]   docusate sodium (COLACE) 100 MG capsule Take 100 mg by mouth at bedtime.   Yes [provider]  losartan (COZAAR) 100 MG tablet Take 100 mg by mouth every morning.  02/10/19  Yes [provider]  metoprolol (LOPRESSOR) 100 MG tablet Take 100 mg by mouth 2 (two) times daily.   Yes [provider]  Multiple Vitamin (MULTIVITAMIN WITH MINERALS) TABS tablet Take 1 tablet by mouth daily.   Yes [provider]  Multiple Vitamins-Minerals (PRESERVISION AREDS 2 PO) Take 1 capsule by mouth in the morning and at bedtime.   Yes [provider]  spironolactone (ALDACTONE) 25 MG tablet Take 25 mg by mouth daily. 01/23/21  Yes [provider]  amLODipine (NORVASC) 5 MG tablet Take 7.5 mg by mouth every morning. Patient not taking: Reported on 04/26/2021    [provider]    Ramos Exam: Vitals:   04/26/21 0430 04/26/21 0540 04/26/21 0800 04/26/21 0900  BP: (!) 166/114 (!) 142/88 (!) 156/92 (!) 148/102  Pulse: 87 91 (!) 101 91  Resp: (!) 25 18 (!) 29 (!) 27  Temp:      SpO2: 92% 91% 94% 92%  Weight:      Height:       General: Not in acute distress HEENT:       Eyes: PERRL, EOMI, no scleral icterus.       ENT: No discharge from the ears and nose, no pharynx injection, no tonsillar enlargement.        Neck: No JVD, no bruit, no mass felt. Heme: No neck lymph node enlargement. Cardiac: S1/S2, RRR, No gallops or rubs. Respiratory: No rales, wheezing, rhonchi or rubs. GI: Soft, nondistended, nontender, no rebound pain, no organomegaly, BS present. GU: No hematuria Ext: 1+ pitting leg edema bilaterally. 1+DP/PT pulse bilaterally. Musculoskeletal: No joint deformities, No joint redness or warmth, no limitation of ROM in spin. Skin: No rashes.  Neuro: Alert, oriented X3, cranial nerves II-XII grossly intact, moves all extremities normally. Psych: Patient is not psychotic, no suicidal or hemocidal ideation.  Labs on Admission: I have  personally reviewed following labs and imaging studies  CBC: Recent Labs  Lab 04/26/21 0139  WBC 15.9*  NEUTROABS 14.1*  HGB 15.9*  HCT 48.7*  MCV 92.4  PLT 937   Basic Metabolic Panel: Recent Labs  Lab 04/26/21 0139  NA 140  K 5.2*  CL 108  CO2 25  GLUCOSE 170*  BUN 22  CREATININE 1.29*  CALCIUM 9.9   GFR: Estimated Creatinine Clearance: 28.9 mL/min (A) (by C-G formula based on SCr of 1.29 mg/dL (H)). Liver Function Tests: No results for input(s): AST, ALT, ALKPHOS, BILITOT, PROT, ALBUMIN in the last 168 hours. No results for input(s): LIPASE, AMYLASE in the last 168 hours. No results for input(s): AMMONIA in the last 168 hours. Coagulation Profile: Recent Labs  Lab 04/26/21 0725  INR 1.1   Cardiac Enzymes: No results for input(s): CKTOTAL, CKMB, CKMBINDEX, TROPONINI in the last 168 hours. BNP (last 3 results) No results for input(s): PROBNP in the last 8760 hours. HbA1C: No results for input(s): HGBA1C in the last 72 hours. CBG: No results for input(s): GLUCAP in the last 168 hours. Lipid Profile: No results for input(s): CHOL, HDL, LDLCALC, TRIG, CHOLHDL, LDLDIRECT in the last 72 hours. Thyroid Function Tests: No results for input(s): TSH, T4TOTAL, FREET4, T3FREE, THYROIDAB in the last 72 hours. Anemia Panel: No results for input(s): VITAMINB12, FOLATE, FERRITIN, TIBC, IRON, RETICCTPCT in the last 72 hours. Urine analysis: No results found for: COLORURINE, APPEARANCEUR, LABSPEC, PHURINE, GLUCOSEU, HGBUR, BILIRUBINUR, KETONESUR, PROTEINUR, UROBILINOGEN, NITRITE, LEUKOCYTESUR Sepsis Labs: @LABRCNTIP (procalcitonin:4,lacticidven:4) ) Recent Results (from the past 240 hour(s))  Resp Panel by RT-PCR (Flu A&B, Covid) Nasopharyngeal Swab     Status: None   Collection Time: 04/26/21  1:39 AM   Specimen: Nasopharyngeal Swab; Nasopharyngeal(NP) swabs in vial transport medium  Result Value Ref Range Status   SARS Coronavirus 2 by RT PCR NEGATIVE NEGATIVE Final     Comment: (NOTE) SARS-CoV-2 target nucleic acids are NOT DETECTED.  The SARS-CoV-2 RNA is generally detectable in upper respiratory specimens during the acute phase of infection. The lowest concentration of SARS-CoV-2 viral copies this assay can detect is 138 copies/mL. A negative result does not preclude SARS-Cov-2 infection and should not be used as the sole basis for treatment  or other patient management decisions. A negative result may occur with  improper specimen collection/handling, submission of specimen other than nasopharyngeal swab, presence of viral mutation(s) within the areas targeted by this assay, and inadequate number of viral copies(<138 copies/mL). A negative result must be combined with clinical observations, patient history, and epidemiological information. The expected result is Negative.  Fact Sheet for Patients:  EntrepreneurPulse.com.au  Fact Sheet for Healthcare Providers:  IncredibleEmployment.be  This test is no t yet approved or cleared by the [place] FDA and  has been authorized for detection and/or diagnosis of SARS-CoV-2 by FDA under an Emergency Use Authorization (EUA). This EUA will remain  in effect (meaning this test can be used) for the duration of the COVID-19 declaration under Section 564(b)(1) of the Act, 21 U.S.C.section 360bbb-3(b)(1), unless the authorization is terminated  or revoked sooner.       Influenza A by PCR NEGATIVE NEGATIVE Final   Influenza B by PCR NEGATIVE NEGATIVE Final    Comment: (NOTE) The Xpert Xpress SARS-CoV-2/FLU/RSV plus assay is intended as an aid in the diagnosis of influenza from Nasopharyngeal swab specimens and should not be used as a sole basis for treatment. Nasal washings and aspirates are unacceptable for Xpert Xpress SARS-CoV-2/FLU/RSV testing.  Fact Sheet for Patients: EntrepreneurPulse.com.au  Fact Sheet for Healthcare  Providers: IncredibleEmployment.be  This test is not yet approved or cleared by the [place] FDA and has been authorized for detection and/or diagnosis of SARS-CoV-2 by FDA under an Emergency Use Authorization (EUA). This EUA will remain in effect (meaning this test can be used) for the duration of the COVID-19 declaration under Section 564(b)(1) of the Act, 21 U.S.C. section 360bbb-3(b)(1), unless the authorization is terminated or revoked.  Performed at [facility], [address] [place], [place] [address]      Radiological Exams on Admission: DG Chest 2 View  Result Date: 04/26/2021 CLINICAL DATA:  Shortness of breath. EXAM: CHEST - 2 VIEW COMPARISON:  February 04, 2018 FINDINGS: Mild, stable left basilar linear scarring is seen. There is no evidence of acute infiltrate, pleural effusion or pneumothorax. The heart size and mediastinal contours are within normal limits. There is moderate to marked severity calcification of the aortic arch and tortuosity of the descending thoracic aorta. A chronic compression fracture deformity is again seen within the midthoracic spine. IMPRESSION: Stable left basilar linear scarring without acute or active cardiopulmonary disease. Electronically Signed   By: Virgina Norfolk M.D.   On: 04/26/2021 01:44   CT Angio Chest PE W and/or Wo Contrast  Result Date: 04/26/2021 CLINICAL DATA:  Pulmonary embolism (PE) suspected, high prob. Dyspnea, lower extremity edema. EXAM: CT ANGIOGRAPHY CHEST WITH CONTRAST TECHNIQUE: Multidetector CT imaging of the chest was performed using the standard protocol during bolus administration of intravenous contrast. Multiplanar CT image reconstructions and MIPs were obtained to evaluate the vascular anatomy. CONTRAST:  63mL OMNIPAQUE IOHEXOL 350 MG/ML SOLN COMPARISON:  None. FINDINGS: Cardiovascular: There is adequate opacification the pulmonary arterial tree. There is extensive intraluminal  filling defect within the right pulmonary artery extending into all lobar and segmental pulmonary arteries as well as within multiple segmental branches within the left upper and lower lobes in keeping with acute pulmonary embolism. The central pulmonary arteries are enlarged in keeping with pulmonary arterial hypertension. There is enlargement of the right heart with reversal of the normal RV/LV ratio (2.0). While the right ventricle appears mildly enlarged on prior examination, the degree of relative enlargement appears to of  progressed in keeping with an element of acute right heart strain. Moderate coronary artery calcification. Mild global cardiomegaly. No pericardial effusion. Moderate atherosclerotic calcification within the thoracic aorta. No aortic aneurysm. Mediastinum/Nodes: Nodule within the inferior left thyroid lobe has enlarged since prior examination now measuring 19 mm on axial image # 62/6, not well characterized on this examination. No pathologic thoracic adenopathy. Esophagus is unremarkable. Lungs/Pleura: Scattered areas of airway impaction are noted within the lung bases, nonspecific. Mean 6 mm noncalcified pulmonary nodule is seen within the right upper lobe, axial image # 33/7, new since prior examination and indeterminate. The lungs are otherwise clear. No pneumothorax or pleural effusion. Central airways are widely patent. Upper Abdomen: Post believe changes noted within the left posterior pararenal space. No acute abnormality. Musculoskeletal: Remote midthoracic compression fracture noted. No acute bone abnormality. No lytic or blastic bone lesion identified. Review of the MIP images confirms the above findings. IMPRESSION: Positive for acute PE with CTevidence of right heart strain (RV/LV Ratio = 2.0) consistent with at least submassive (intermediate risk) PE. The presence of right heart strain has been associated with an increased risk of morbidity and mortality. Moderate coronary artery  calcification. 19 mm enlarging nodule within the left thyroid lobe inferiorly. Recommend thyroid US (ref: J Am Coll Radiol. 2015 Feb;12(2): 143-50). 6 mm right solid pulmonary nodule within the upper lobe. Recommend a non-contrast Chest CT at 6-12 months. If patient is high risk for malignancy, recommend an additional non-contrast Chest CT at 18-24 months; if patient is low risk for malignancy a non-contrast Chest CT at 18-24 months is optional. These guidelines do not apply to immunocompromised patients and patients with cancer. Follow up in patients with significant comorbidities as clinically warranted. For lung cancer screening, adhere to Lung-RADS guidelines. Reference: Radiology. 2017; 284(1):228-43. These results were called by telephone at the time of interpretation on 04/26/2021 at 3:43 am to provider [facility] , who verbally acknowledged these results. Electronically Signed   By: Fidela Salisbury M.D.   On: 04/26/2021 03:52     EKG: I have personally reviewed.  Atrial fibrillation, QTC 482, nonspecific T wave change   Assessment/Plan Principal Problem:   Acute pulmonary embolism (HCC) Active Problems:   HLD (hyperlipidemia)   Hypertension   CKD (chronic kidney disease), stage IIIa   PAF (paroxysmal atrial fibrillation) (HCC)   Acute respiratory failure with hypoxia (HCC)   Leukocytosis   Hyperkalemia   Thyroid nodule   Lung nodule   CAD (coronary artery disease)   Acute respiratory failure with hypoxia due to acute pulmonary embolism ([place]):  pt has acute bilateral PE with CT evidence of right heart strain.  She has oxygen desaturation to 81% on room air.  Now on 4 L oxygen.  Patient still has tachycardia with heart rate 100-110s, and tachypnea with RR 29.  Patient is at high risk of deteriorating. I consulted vascular surgeon, Dr. Trula Ramos who does not do thrombolectomy procedure, he recommended to consult interventional radiologist in [facility].  I consulted Dr. Denna Ramos who  recommended to transfer patient to [facility] ICU.  He will do procedure after patient arrived to [facility]. I consulted Dr. Ander Ramos of PCCM in [facility] who has kindly accepted the patient to ICU in [facility]. I also discussed the case with Dr. Mortimer Fries of ICU here.   -Patient initially was admitted to progressive bed, now we will transfer patient to [facility] ICU -heparin drip initiated -2D echocardiogram ordered -LE dopplers ordered to evaluate for  DVT -prn albuterol nebs and mucinex   HLD (hyperlipidemia): Patient not taking statin -Follow-up with PCP  HTN: -Amlodipine, losartan, metoprolol, spironolactone  CKD (chronic kidney disease), stage IIIa -stable -f/u with BMP  PAF (paroxysmal atrial fibrillation) (HCC) -continue metoprolol  Leukocytosis: WBC 15.9, no signs of infection.  No fever.  Likely reactive -Follow-up with CBC  Hyperkalemia: Potassium -Leukoma 10 g  Thyroid nodule and lung nodule: Incidental finding is by CT angiogram -Followed up with PCP  CAD (coronary artery disease): No chest pain -Observe closely  Bilateral leg edema: Etiology is not clear -Follow-up 2D echo and lower extremity Doppler -Check BMP     DVT ppx: on IV Heparin    Code Status: Full code Family Communication: Yes, patient's daughter by phone Disposition Plan:  Anticipate discharge back to previous environment Consults called:  Dr. Denna Ramos of IR and Dr. Ander Ramos of PCCM were consulted.  Admission status and Level of care: Progressive:    as inpt         Status is: Inpatient  Remains inpatient appropriate because: Patient has multiple comorbidities, now presents with acute bilateral PE with CT evidence of right heart strain.  Patient has acute respiratory failure with hypoxia.  Patient needs 4L oxygen now. Patient is at high risk of deteriorating.  Patient needs to thrombolectomy procedure.  We need to be treated in hospital for at least 2 days.          Date of  Service 04/26/2021    Ivor Costa [facility]   If 7PM-7AM, please contact night-coverage [email or web address] 04/26/2021, 9:36 AM

## 2021-04-26 NOTE — ED Notes (Signed)
Pt placed on 2lpm, O2 increased to 85%. Increased O2 to 3lpm, O2 increased to 88%. Increased O2 to 4lpm, O2 increased to 92%.

## 2021-04-26 NOTE — H&P (Signed)
NAME:  Tara Ramos, MRN:  [id number], DOB:  1931-08-25, LOS: 0 ADMISSION DATE:  04/26/2021, CONSULTATION DATE:  04/26/2021 REFERRING MD:  transfer from [facility], CHIEF COMPLAINT:  PE   History of Present Illness:  Patient transferred from [facility] after presenting with shortness of breath starting 2 days ago, felt it was related to a cough she is out for about a week, shortness of breath progressed up to the point day where she got short of breath with every activity.  Led to presentation to the hospital Evaluation revealed large pulmonary embolism with right heart strain Transferred to [facility] for catheter thrombolytics  Pertinent  Medical History   Past Medical History:  Diagnosis Date   Arthritis    Breast cancer ([place]) 1992   right breast/radiation   Breast cancer ([place]) 1990   left breast/radiation   Breast cancer ([place]) 2014   right breast   Breast cancer, right breast ([place]) 12/16/2012   left, then right, then right breast cancers (lumpectomies and radiation therapy)   Chronic kidney disease    Dysrhythmia    HX A FIB - FOLLOWED BY [name]   GERD (gastroesophageal reflux disease)    Heart murmur    hx of years ago    Hemorrhoids    Hypertension    Personal history of radiation therapy    1990/1992   Renal cell cancer, right ([place]) 11/2014   Right Nephrectomy and Left renal ablation for lesion.    Renal insufficiency    Status post radiation therapy    breast cancer bilateral    Significant Hospital Events: Including procedures, antibiotic start and stop dates in addition to other pertinent events   CT significant for large pulmonary embolism  Interim History / Subjective:  Shortness of breath, denies any chest pain or chest discomfort Minimal increase in her usual bilateral leg edema  Objective   Blood pressure (!) 137/91, pulse 97, resp. rate (!) 21, weight 74.7 kg, SpO2 93 %.       No intake or output data in the 24 hours ending 04/26/21  1434 Filed Weights   04/26/21 1430  Weight: 74.7 kg    Examination: General: Elderly, does not appear to be in distress HENT: Moist oral mucosa Lungs: Clear breath sounds bilaterally Cardiovascular: S1-S2 appreciated Abdomen: Soft, bowel sounds appreciated Extremities: 1+ edema bilaterally Neuro: Alert and oriented x3 GU: [place] [facility] Problem list     Assessment & Plan:  Acute pulmonary embolism with right ventricular strain -RV strain on CT scan of the chest-RV/LV ratio of 2.0 -BNP elevated, normal troponin -Echocardiogram performed-no results on record yet  Acute hypoxemic respiratory failure Secondary to large pulmonary embolism -Oxygen supplementation at 4 L  6 mm lung nodule -Needs radiological follow-up  History of breast cancer -Radical mastectomy on the right 01/08/2020  History of renal cancer -Laparoscopic nephrectomy 2016  Hypertension -Amlodipine, losartan, metoprolol, spironolactone  Hyperlipidemia -Not on statins at baseline  Chronic kidney disease stage IIIa -Trend electrolytes  Paroxysmal atrial fibrillation -On metoprolol  Very high risk PE by PESI score-157  Contacted interventional radiology and the patient will be seen for catheter directed thrombolytics  Best Practice (right click and "Reselect all SmartList Selections" daily)   Diet/type: NPO DVT prophylaxis: systemic heparin GI prophylaxis: PPI Lines: N/A Foley:  N/A Code Status:  full code Last date of multidisciplinary goals of care discussion [pending]  Labs   CBC: Recent Labs  Lab 04/26/21 0139  WBC 15.9*  NEUTROABS 14.1*  HGB 15.9*  HCT 48.7*  MCV 92.4  PLT 127    Basic Metabolic Panel: Recent Labs  Lab 04/26/21 0139  NA 140  K 5.2*  CL 108  CO2 25  GLUCOSE 170*  BUN 22  CREATININE 1.29*  CALCIUM 9.9   GFR: Estimated Creatinine Clearance: 29.1 mL/min (A) (by C-G formula based on SCr of 1.29 mg/dL (H)). Recent Labs  Lab  04/26/21 0139  WBC 15.9*    Liver Function Tests: No results for input(s): AST, ALT, ALKPHOS, BILITOT, PROT, ALBUMIN in the last 168 hours. No results for input(s): LIPASE, AMYLASE in the last 168 hours. No results for input(s): AMMONIA in the last 168 hours.  ABG No results found for: PHART, PCO2ART, PO2ART, HCO3, TCO2, ACIDBASEDEF, O2SAT   Coagulation Profile: Recent Labs  Lab 04/26/21 0725  INR 1.1    Cardiac Enzymes: No results for input(s): CKTOTAL, CKMB, CKMBINDEX, TROPONINI in the last 168 hours.  HbA1C: No results found for: HGBA1C  CBG: Recent Labs  Lab 04/26/21 1410  GLUCAP 132*    Review of Systems:   Quit smoking over 50 years ago  Past Medical History:  She,  has a past medical history of Arthritis, Breast cancer ([place]) (1992), Breast cancer ([place]) (1990), Breast cancer ([place]) (2014), Breast cancer, right breast ([place]) (12/16/2012), Chronic kidney disease, Dysrhythmia, GERD (gastroesophageal reflux disease), Heart murmur, Hemorrhoids, Hypertension, Personal history of radiation therapy, Renal cell cancer, right ([place]) (11/2014), Renal insufficiency, and Status post radiation therapy.   Surgical History:   Past Surgical History:  Procedure Laterality Date   BREAST BIOPSY Right 03/10/2016   -  STROMAL SCLEROSIS, GIANT CELL REACTION, CHRONIC INFLAMMATION AND FAT    BREAST BIOPSY Right 12/12/2019   [place] bx of mass, vision marker, path pending   BREAST BIOPSY Right 12/12/2019   [place] bx of LN, hydromarker, path pending   BREAST BIOPSY Left 12/12/2019   stereo bx of calcs, coil marker, path pending   BREAST EXCISIONAL BIOPSY Right 2014   +   BREAST EXCISIONAL BIOPSY Right 1992   +   BREAST EXCISIONAL BIOPSY Left 1990   +   BREAST LUMPECTOMY Left 1990   BREAST LUMPECTOMY Right 1992   BROW LIFT Bilateral 11/10/2019   Procedure: BLEPHAROPLASTY UPPER EYELID; W/EXCESS SKIN BILATERAL & ECTROPION REPAIR, EXTENSIVE LEFT LOWER LID;  Surgeon: Karle Starch, MD;   Location: [facility];  Service: Ophthalmology;  Laterality: Bilateral;   DILATION AND CURETTAGE OF UTERUS     EYE SURGERY Bilateral    cataract extraction   KYPHOPLASTY N/A 10/09/2014   Procedure: KYPHOPLASTY;  Surgeon: Hessie Knows, MD;  Location: [facility];  Service: Orthopedics;  Laterality: N/A;  T7 Kyphoplasty with bone biopsy   MASTECTOMY MODIFIED RADICAL Right 01/08/2020   Procedure: MASTECTOMY MODIFIED RADICAL;  Surgeon: Robert Bellow, MD;  Location: [facility];  Service: General;  Laterality: Right;   OTHER SURGICAL HISTORY     radiation therapy breast   ROBOT ASSISTED LAPAROSCOPIC NEPHRECTOMY Right 02/01/2015   Procedure: ROBOTIC ASSISTED LAPAROSCOPIC RIGHT NEPHRECTOMY;  Surgeon: Cleon Gustin, MD;  Location: [facility];  Service: Urology;  Laterality: Right;   SIMPLE MASTECTOMY WITH AXILLARY SENTINEL NODE BIOPSY Left 01/08/2020   Procedure: SIMPLE MASTECTOMY;  Surgeon: Robert Bellow, MD;  Location: [facility];  Service: General;  Laterality: Left;     Social History:   reports that she quit smoking about 49 years ago. Her smoking use included cigarettes. She has never  used smokeless tobacco. She reports current alcohol use of about 1.0 standard drink per week. She reports that she does not use drugs.   Family History:  Her family history includes Brain cancer in her sister; Breast cancer (age of onset: 57) in her daughter; Stomach cancer in her mother; Stroke in her father. There is no history of Bladder Cancer, Prostate cancer, or Kidney cancer.   Allergies Allergies  Allergen Reactions   Morphine And Related Nausea And Vomiting   Lisinopril Cough   Tape Other (See Comments)    Blisters underneath; wide white hypofix tape post kidney biopsy      Home Medications  Prior to Admission medications   Medication Sig Start Date End Date Taking? Authorizing Provider  amLODipine (NORVASC) 10 MG tablet Take 10 mg by mouth daily. 04/17/21   [provider]   amLODipine (NORVASC) 5 MG tablet Take 7.5 mg by mouth every morning. Patient not taking: Reported on 04/26/2021    [provider]  Ascorbic Acid (VITAMIN C) 1000 MG tablet Take 1,000 mg by mouth daily.     [provider]  Calcium Carb-Cholecalciferol (CALCIUM + D3) 600-200 MG-UNIT TABS Take 1 tablet by mouth daily.     [provider]  docusate sodium (COLACE) 100 MG capsule Take 100 mg by mouth at bedtime.    [provider]  losartan (COZAAR) 100 MG tablet Take 100 mg by mouth every morning.  02/10/19   [provider]  metoprolol (LOPRESSOR) 100 MG tablet Take 100 mg by mouth 2 (two) times daily.    [provider]  Multiple Vitamin (MULTIVITAMIN WITH MINERALS) TABS tablet Take 1 tablet by mouth daily.    [provider]  Multiple Vitamins-Minerals (PRESERVISION AREDS 2 PO) Take 1 capsule by mouth in the morning and at bedtime.    [provider]  spironolactone (ALDACTONE) 25 MG tablet Take 25 mg by mouth daily. 01/23/21   [provider]    The patient is critically ill with multiple organ systems failure and requires high complexity decision making for assessment and support, frequent evaluation and titration of therapies, application of advanced monitoring technologies and extensive interpretation of multiple databases. Critical Care Time devoted to patient care services described in this note independent of APP/resident time (if applicable)  is 35 minutes.   Sherrilyn Rist MD [facility] Personal pager: See Amion If unanswered, please page CCM On-call: [phone]

## 2021-04-26 NOTE — Procedures (Signed)
Interventional Radiology Procedure Note  Date of Procedure: 04/26/2021  Procedure: Catheter directed PE thrombectomy   Findings:  1. Successful catheter directed PE thrombectomy with In[place] 24 Fr suction thrombectomy catheter. Large amount of clot removed from the right PA. Patient felt breathing easier and vital signs significantly improved during after case.  2. R CFV access with 24 Fr sheath. Closed with pursestring suture.   Complications: No immediate complications noted.   Estimated Blood Loss:  21mL  Follow-up and Recommendations: 1. Strict bedrest until IR removes groin suture in the AM  2. Continue IV heparin    Albin Felling, MD  Vascular & Interventional Radiology  04/26/2021 6:30 PM

## 2021-04-26 NOTE — ED Notes (Signed)
Pt returned from CT at this time, alert and speaking with this RN.

## 2021-04-26 NOTE — Progress Notes (Addendum)
Diagnosis of PE Recommned Heparin and Oxygen therapy as needed for now.  Case discussed with Dr Blaine Hamper I have advised to call Radiology on call for possible invasive therapy at [facility] is feasible and reasonable for transfer    PCCM will follow along peripherally

## 2021-04-26 NOTE — Progress Notes (Signed)
ANTICOAGULATION CONSULT NOTE - Initial Consult  Pharmacy Consult for IV Heparin (transferred on therapy from [facility]) Indication: pulmonary embolus  Allergies  Allergen Reactions   Morphine And Related Nausea And Vomiting   Lisinopril Cough   Tape Other (See Comments)    Blisters underneath; wide white hypofix tape post kidney biopsy     Patient Measurements: Weight: 74.7 kg (164 lb 10.9 oz) Heparin Dosing Weight: 66 kg  Vital Signs: Temp: 97.8 F (36.6 C) (12/31 1317) BP: 137/91 (12/31 1430) Pulse Rate: 97 (12/31 1430)  Labs: Recent Labs    04/26/21 0139 04/26/21 0334 04/26/21 0725  HGB 15.9*  --   --   HCT 48.7*  --   --   PLT 324  --   --   APTT  --   --  118*  LABPROT  --   --  14.1  INR  --   --  1.1  CREATININE 1.29*  --   --   TROPONINIHS 9 11  --     Estimated Creatinine Clearance: 29.1 mL/min (A) (by C-G formula based on SCr of 1.29 mg/dL (H)).   Medical History: Past Medical History:  Diagnosis Date   Arthritis    Breast cancer ([place]) 1992   right breast/radiation   Breast cancer ([place]) 1990   left breast/radiation   Breast cancer ([place]) 2014   right breast   Breast cancer, right breast ([place]) 12/16/2012   left, then right, then right breast cancers (lumpectomies and radiation therapy)   Chronic kidney disease    Dysrhythmia    HX A FIB - FOLLOWED BY [name]   GERD (gastroesophageal reflux disease)    Heart murmur    hx of years ago    Hemorrhoids    Hypertension    Personal history of radiation therapy    1990/1992   Renal cell cancer, right ([place]) 11/2014   Right Nephrectomy and Left renal ablation for lesion.    Renal insufficiency    Status post radiation therapy    breast cancer bilateral    Medications:  Infusions:   heparin      Assessment: 87 years of age female with acute large pulmonary embolism who was initiated on IV Heparin ~5 AM at [facility] then transferred for catheter directed lysis. IV heparin currently running at 1100  units/hr (11 ml/hr). Running well without issue. No bleeding noted. No levels have yet to be drawn. Going for lysis in IR at 1600 PM.  CBC is stable.   Goal of Therapy:  Heparin level 0.3-0.7 units/ml Monitor platelets by anticoagulation protocol: Yes   Plan:  Continue IV Heparin at 1100 units/hr (11 ml/hr). Attempt to get Heparin level prior to lysis to ensure safe dosing.  Follow-up post-lysis.   Sloan Leiter, PharmD, BCPS, BCCCP Clinical Pharmacist Please refer to [facility] for [place] numbers 04/26/2021,3:23 PM

## 2021-04-26 NOTE — ED Notes (Signed)
Pt taken to CT at this time,

## 2021-04-26 NOTE — ED Notes (Signed)
Pt alert and oriented, given warm blanket at this time per her request.

## 2021-04-26 NOTE — ED Notes (Signed)
Pt resting with eyes closed, respirations even and unlabored at this time.

## 2021-04-26 NOTE — Consult Note (Shared)
Chief Complaint: Patient was seen in consultation today for  Chief Complaint  Patient presents with   Shortness of Breath    Referring Physician(s): Dr.    Selinda Flavin Physician: Juliet Rude  Patient Status: [facility] - ED  History of Present Illness: Tara Ramos is a 87 y.o. female with a medical history significant for breast cancer s/p radiation and mastectomy, renal cell cancer s/p right nephrectomy and left ablation, CKD stage IIIa, CAD, PAF (not on anticoagulation) and HTN. She presented to the [facility] ED with shortness of breath and work up was positive for bilateral PE with right heart strain.   Interventional Radiology has been asked to evaluate this patient for an image-guided thrombectomy. Imaging reviewed and procedure approved by Dr. Denna Haggard. The patient is currently awaiting transport to [facility] and she will be admitted to the ICU/Critical Care team.   Past Medical History:  Diagnosis Date   Arthritis    Breast cancer [facility]) 1992   right breast/radiation   Breast cancer ([place]) 1990   left breast/radiation   Breast cancer ([place]) 2014   right breast   Breast cancer, right breast ([place]) 12/16/2012   left, then right, then right breast cancers (lumpectomies and radiation therapy)   Chronic kidney disease    Dysrhythmia    HX A FIB - FOLLOWED BY [name]   GERD (gastroesophageal reflux disease)    Heart murmur    hx of years ago    Hemorrhoids    Hypertension    Personal history of radiation therapy    1990/1992   Renal cell cancer, right ([place]) 11/2014   Right Nephrectomy and Left renal ablation for lesion.    Renal insufficiency    Status post radiation therapy    breast cancer bilateral    Past Surgical History:  Procedure Laterality Date   BREAST BIOPSY Right 03/10/2016   -  STROMAL SCLEROSIS, GIANT CELL REACTION, CHRONIC INFLAMMATION AND FAT    BREAST BIOPSY Right 12/12/2019   [place] bx of mass, vision marker, path pending   BREAST BIOPSY Right  12/12/2019   [place] bx of LN, hydromarker, path pending   BREAST BIOPSY Left 12/12/2019   stereo bx of calcs, coil marker, path pending   BREAST EXCISIONAL BIOPSY Right 2014   +   BREAST EXCISIONAL BIOPSY Right 1992   +   BREAST EXCISIONAL BIOPSY Left 1990   +   BREAST LUMPECTOMY Left 1990   BREAST LUMPECTOMY Right 1992   BROW LIFT Bilateral 11/10/2019   Procedure: BLEPHAROPLASTY UPPER EYELID; W/EXCESS SKIN BILATERAL & ECTROPION REPAIR, EXTENSIVE LEFT LOWER LID;  Surgeon: Karle Starch, MD;  Location: [facility];  Service: Ophthalmology;  Laterality: Bilateral;   DILATION AND CURETTAGE OF UTERUS     EYE SURGERY Bilateral    cataract extraction   KYPHOPLASTY N/A 10/09/2014   Procedure: KYPHOPLASTY;  Surgeon: Hessie Knows, MD;  Location: [facility] ORS;  Service: Orthopedics;  Laterality: N/A;  T7 Kyphoplasty with bone biopsy   MASTECTOMY MODIFIED RADICAL Right 01/08/2020   Procedure: MASTECTOMY MODIFIED RADICAL;  Surgeon: Robert Bellow, MD;  Location: [facility] ORS;  Service: General;  Laterality: Right;   OTHER SURGICAL HISTORY     radiation therapy breast   ROBOT ASSISTED LAPAROSCOPIC NEPHRECTOMY Right 02/01/2015   Procedure: ROBOTIC ASSISTED LAPAROSCOPIC RIGHT NEPHRECTOMY;  Surgeon: Cleon Gustin, MD;  Location: [facility];  Service: Urology;  Laterality: Right;   SIMPLE MASTECTOMY WITH AXILLARY SENTINEL NODE BIOPSY Left 01/08/2020  Procedure: SIMPLE MASTECTOMY;  Surgeon: Robert Bellow, MD;  Location: [facility] OR[facility];  Service: General;  Laterality: Left;    Allergies: Morphine and related, Lisinopril, and Tape  Medications: Prior to Admission medications   Medication Sig Start Date End Date Taking? Authorizing Provider  amLODipine (NORVASC) 10 MG tablet Take 10 mg by mouth daily. 04/17/21  Yes [provider]  Ascorbic Acid (VITAMIN C) 1000 MG tablet Take 1,000 mg by mouth daily.    Yes [provider]  Calcium Carb-Cholecalciferol (CALCIUM + D3) 600-200 MG-UNIT  TABS Take 1 tablet by mouth daily.    Yes [provider]  docusate sodium (COLACE) 100 MG capsule Take 100 mg by mouth at bedtime.   Yes [provider]  losartan (COZAAR) 100 MG tablet Take 100 mg by mouth every morning.  02/10/19  Yes [provider]  metoprolol (LOPRESSOR) 100 MG tablet Take 100 mg by mouth 2 (two) times daily.   Yes [provider]  Multiple Vitamin (MULTIVITAMIN WITH MINERALS) TABS tablet Take 1 tablet by mouth daily.   Yes [provider]  Multiple Vitamins-Minerals (PRESERVISION AREDS 2 PO) Take 1 capsule by mouth in the morning and at bedtime.   Yes [provider]  spironolactone (ALDACTONE) 25 MG tablet Take 25 mg by mouth daily. 01/23/21  Yes [provider]  amLODipine (NORVASC) 5 MG tablet Take 7.5 mg by mouth every morning. Patient not taking: Reported on 04/26/2021    [provider]     Family History  Problem Relation Age of Onset   Stomach cancer Mother    Stroke Father    Brain cancer Sister    Breast cancer Daughter 101   Bladder Cancer Neg Hx    Prostate cancer Neg Hx    Kidney cancer Neg Hx     Social History   Socioeconomic History   Marital status: Married    Spouse name: Not on file   Number of children: Not on file   Years of education: Not on file   Highest education level: Not on file  Occupational History   Not on file  Tobacco Use   Smoking status: Former    Years: 20.00    Types: Cigarettes    Quit date: 04/27/1972    Years since quitting: 49.0   Smokeless tobacco: Never  Substance and Sexual Activity   Alcohol use: Yes    Alcohol/week: 1.0 standard drink    Types: 1 Glasses of wine per week    Comment: 1 glass of wine with dinner occ   Drug use: No   Sexual activity: Never  Other Topics Concern   Not on file  Social History Narrative   Not on file   Social Determinants of Health   Financial Resource Strain: Not on file  Food Insecurity: Not on  file  Transportation Needs: Not on file  Physical Activity: Not on file  Stress: Not on file  Social Connections: Not on file    Review of Systems: A 12 point ROS discussed and pertinent positives are indicated in the HPI above.  All other systems are negative.  Review of Systems  Vital Signs: BP (!) 132/94    Pulse (!) 108    Temp 98.5 F (36.9 C)    Resp (!) 32    Ht 5\' 2"  (1.575 m)    Wt 163 lb (73.9 kg)    SpO2 92%    BMI 29.81 kg/m   Physical Exam  Imaging: DG Chest 2 View  Result Date: 04/26/2021 CLINICAL DATA:  Shortness of breath. EXAM: CHEST - 2 VIEW COMPARISON:  February 04, 2018 FINDINGS: Mild, stable left basilar linear scarring is seen. There is no evidence of acute infiltrate, pleural effusion or pneumothorax. The heart size and mediastinal contours are within normal limits. There is moderate to marked severity calcification of the aortic arch and tortuosity of the descending thoracic aorta. A chronic compression fracture deformity is again seen within the midthoracic spine. IMPRESSION: Stable left basilar linear scarring without acute or active cardiopulmonary disease. Electronically Signed   By: Virgina Norfolk M.D.   On: 04/26/2021 01:44   CT Angio Chest PE W and/or Wo Contrast  Result Date: 04/26/2021 CLINICAL DATA:  Pulmonary embolism (PE) suspected, high prob. Dyspnea, lower extremity edema. EXAM: CT ANGIOGRAPHY CHEST WITH CONTRAST TECHNIQUE: Multidetector CT imaging of the chest was performed using the standard protocol during bolus administration of intravenous contrast. Multiplanar CT image reconstructions and MIPs were obtained to evaluate the vascular anatomy. CONTRAST:  60mL OMNIPAQUE IOHEXOL 350 MG/ML SOLN COMPARISON:  None. FINDINGS: Cardiovascular: There is adequate opacification the pulmonary arterial tree. There is extensive intraluminal filling defect within the right pulmonary artery extending into all lobar and segmental pulmonary arteries as well as  within multiple segmental branches within the left upper and lower lobes in keeping with acute pulmonary embolism. The central pulmonary arteries are enlarged in keeping with pulmonary arterial hypertension. There is enlargement of the right heart with reversal of the normal RV/LV ratio (2.0). While the right ventricle appears mildly enlarged on prior examination, the degree of relative enlargement appears to of progressed in keeping with an element of acute right heart strain. Moderate coronary artery calcification. Mild global cardiomegaly. No pericardial effusion. Moderate atherosclerotic calcification within the thoracic aorta. No aortic aneurysm. Mediastinum/Nodes: Nodule within the inferior left thyroid lobe has enlarged since prior examination now measuring 19 mm on axial image # 62/6, not well characterized on this examination. No pathologic thoracic adenopathy. Esophagus is unremarkable. Lungs/Pleura: Scattered areas of airway impaction are noted within the lung bases, nonspecific. Mean 6 mm noncalcified pulmonary nodule is seen within the right upper lobe, axial image # 33/7, new since prior examination and indeterminate. The lungs are otherwise clear. No pneumothorax or pleural effusion. Central airways are widely patent. Upper Abdomen: Post believe changes noted within the left posterior pararenal space. No acute abnormality. Musculoskeletal: Remote midthoracic compression fracture noted. No acute bone abnormality. No lytic or blastic bone lesion identified. Review of the MIP images confirms the above findings. IMPRESSION: Positive for acute PE with CTevidence of right heart strain (RV/LV Ratio = 2.0) consistent with at least submassive (intermediate risk) PE. The presence of right heart strain has been associated with an increased risk of morbidity and mortality. Moderate coronary artery calcification. 19 mm enlarging nodule within the left thyroid lobe inferiorly. Recommend thyroid US (ref: J Am Coll  Radiol. 2015 Feb;12(2): 143-50). 6 mm right solid pulmonary nodule within the upper lobe. Recommend a non-contrast Chest CT at 6-12 months. If patient is high risk for malignancy, recommend an additional non-contrast Chest CT at 18-24 months; if patient is low risk for malignancy a non-contrast Chest CT at 18-24 months is optional. These guidelines do not apply to immunocompromised patients and patients with cancer. Follow up in patients with significant comorbidities as clinically warranted. For lung cancer screening, adhere to Lung-RADS guidelines. Reference: Radiology. 2017; 284(1):228-43. These results were called by telephone at the time of  interpretation on 04/26/2021 at 3:43 am to provider [facility] , who verbally acknowledged these results. Electronically Signed   By: Fidela Salisbury M.D.   On: 04/26/2021 03:52   MR KNEE RIGHT WO CONTRAST  Result Date: 04/02/2021 CLINICAL DATA:  Right knee pain with medial swelling x 6 weeks. EXAM: MRI OF THE RIGHT KNEE WITHOUT CONTRAST TECHNIQUE: Multiplanar, multisequence MR imaging of the knee was performed. No intravenous contrast was administered. COMPARISON:  None. FINDINGS: MENISCI Medial meniscus: There is fraying of the posterior root of the medial meniscus with possible nondisplaced degenerative tearing. Intrasubstance degenerative signal within the meniscal body. Lateral meniscus: Intact. LIGAMENTS Cruciates: Intact ACL and PCL. Collaterals: Periligamentous edema along the medial collateral ligament. The lateral collateral ligamentous complex is intact. CARTILAGE Patellofemoral: Mild partial-thickness cartilage loss of the medial and lateral patellar facets. Medial: Moderate partial-thickness cartilage loss with focal 4 x 6 mm chondral defect along the weight-bearing medial femoral condyle. Lateral:  Mild chondrosis.  No focal defect. Joint:  Small joint effusion. Popliteal Fossa:  Tiny Granillo cyst. Extensor Mechanism: Intact quadriceps tendon and patellar  tendon. Bones: There is a nondisplaced subchondral insufficiency fracture along the anteromedial tibial plateau, with extensive associated bony edema. Tricompartmental osteophyte formation. Other: Adjacent soft tissue edema along the medial proximal tibia. IMPRESSION: 1. Nondisplaced subchondral insufficiency fracture along the anteromedial tibial plateau, with extensive associated bony edema. 2. Tricompartmental osteoarthritis, worst in the medial compartment, with moderate partial-thickness cartilage loss and focal 4 x 6 mm chondral defect along the weight-bearing medial femoral condyle. 3. Fraying of the posterior root of the medial meniscus with possible nondisplaced degenerative tearing. 4. Grade 1 MCL sprain. 5. Small joint effusion.  Tiny Delehanty cyst. Electronically Signed   By: Maurine Simmering M.D.   On: 04/02/2021 07:55    Labs:  CBC: Recent Labs    04/26/21 0139  WBC 15.9*  HGB 15.9*  HCT 48.7*  PLT 324    COAGS: Recent Labs    04/26/21 0725  INR 1.1  APTT 118*    BMP: Recent Labs    04/26/21 0139  NA 140  K 5.2*  CL 108  CO2 25  GLUCOSE 170*  BUN 22  CALCIUM 9.9  CREATININE 1.29*  GFRNONAA 40*    LIVER FUNCTION TESTS: No results for input(s): BILITOT, AST, ALT, ALKPHOS, PROT, ALBUMIN in the last 8760 hours.  TUMOR MARKERS: No results for input(s): AFPTM, CEA, CA199, CHROMGRNA in the last 8760 hours.  Assessment and Plan:  Bilateral pulmonary emboli with right heart strain: [name], 87 year old female, is tentatively scheduled today for an image-guided thrombectomy of bilateral pulmonary emboli.   Risks and benefits discussed with the patient including, but not limited to bleeding, possible life threatening bleeding and need for blood product transfusion, vascular injury, stroke, contrast induced renal failure, limb loss and infection.  All of the patient's questions were answered, patient is agreeable to proceed.  Consent signed and in chart.  Thank  you for this interesting consult.  I greatly enjoyed meeting Tara Ramos and look forward to participating in their care.  A copy of this report was sent to the requesting provider on this date.  Electronically Signed: Soyla Dryer, AGACNP-BC [phone] 04/26/2021, 11:58 AM   I spent a total of 20 Minutes    in face to face in clinical consultation, greater than 50% of which was counseling/coordinating care for PE thrombectomy

## 2021-04-26 NOTE — ED Notes (Signed)
Patient signed paper copy for transfer consent.

## 2021-04-27 LAB — POCT I-STAT 7, (LYTES, BLD GAS, ICA,H+H)
Acid-base deficit: 2 mmol/L (ref 0.0–2.0)
Bicarbonate: 22.5 mmol/L (ref 20.0–28.0)
Calcium, Ion: 1.29 mmol/L (ref 1.15–1.40)
HCT: 39 % (ref 36.0–46.0)
Hemoglobin: 13.3 g/dL (ref 12.0–15.0)
O2 Saturation: 97 %
Patient temperature: 98.7
Potassium: 4 mmol/L (ref 3.5–5.1)
Sodium: 140 mmol/L (ref 135–145)
TCO2: 24 mmol/L (ref 22–32)
pCO2 arterial: 37.2 mmHg (ref 32.0–48.0)
pH, Arterial: 7.391 (ref 7.350–7.450)
pO2, Arterial: 96 mmHg (ref 83.0–108.0)

## 2021-04-27 LAB — PHOSPHORUS: Phosphorus: 5 mg/dL — ABNORMAL HIGH (ref 2.5–4.6)

## 2021-04-27 LAB — CBC
HCT: 42.8 % (ref 36.0–46.0)
Hemoglobin: 13.9 g/dL (ref 12.0–15.0)
MCH: 30 pg (ref 26.0–34.0)
MCHC: 32.5 g/dL (ref 30.0–36.0)
MCV: 92.2 fL (ref 80.0–100.0)
Platelets: 268 10*3/uL (ref 150–400)
RBC: 4.64 MIL/uL (ref 3.87–5.11)
RDW: 13.8 % (ref 11.5–15.5)
WBC: 13.5 10*3/uL — ABNORMAL HIGH (ref 4.0–10.5)
nRBC: 0 % (ref 0.0–0.2)

## 2021-04-27 LAB — HEPARIN LEVEL (UNFRACTIONATED)
Heparin Unfractionated: 0.79 IU/mL — ABNORMAL HIGH (ref 0.30–0.70)
Heparin Unfractionated: 0.41 IU/mL (ref 0.30–0.70)

## 2021-04-27 LAB — GLUCOSE, CAPILLARY: Glucose-Capillary: 89 mg/dL (ref 70–99)

## 2021-04-27 LAB — BASIC METABOLIC PANEL
Anion gap: 8 (ref 5–15)
BUN: 17 mg/dL (ref 8–23)
CO2: 22 mmol/L (ref 22–32)
Calcium: 8.9 mg/dL (ref 8.9–10.3)
Chloride: 108 mmol/L (ref 98–111)
Creatinine, Ser: 1.46 mg/dL — ABNORMAL HIGH (ref 0.44–1.00)
GFR, Estimated: 35 mL/min — ABNORMAL LOW (ref >60)
Glucose, Bld: 111 mg/dL — ABNORMAL HIGH (ref 70–99)
Potassium: 4.4 mmol/L (ref 3.5–5.1)
Sodium: 138 mmol/L (ref 135–145)

## 2021-04-27 LAB — MAGNESIUM: Magnesium: 2 mg/dL (ref 1.7–2.4)

## 2021-04-27 MED ORDER — CHLORHEXIDINE GLUCONATE CLOTH 2 % EX PADS
6.0000 | MEDICATED_PAD | Freq: Every day | CUTANEOUS | Status: DC
  Administered 2021-04-27 – 2021-04-29 (×3): 6 via TOPICAL

## 2021-04-27 NOTE — Progress Notes (Signed)
[place] for IV Heparin (transferred on therapy from [facility]) Indication: pulmonary embolus  Allergies  Allergen Reactions   Morphine And Related Nausea And Vomiting   Lisinopril Cough   Tape Other (See Comments)    Blisters underneath; wide white hypofix tape post kidney biopsy     Patient Measurements: Weight: 75.8 kg (167 lb 1.7 oz) Heparin Dosing Weight: 66 kg  Vital Signs: Temp: 97.7 F (36.5 C) (01/01 1120) Temp Source: Oral (01/01 1120) BP: 118/90 (01/01 1200) Pulse Rate: 123 (01/01 1200)  Labs: Recent Labs    04/26/21 0139 04/26/21 0334 04/26/21 0725 04/26/21 1552 04/27/21 0105 04/27/21 0609 04/27/21 1128  HGB 15.9*  --   --   --  13.9 13.3  --   HCT 48.7*  --   --   --  42.8 39.0  --   PLT 324  --   --   --  268  --   --   APTT  --   --  118*  --   --   --   --   LABPROT  --   --  14.1  --   --   --   --   INR  --   --  1.1  --   --   --   --   HEPARINUNFRC  --   --   --  0.38 0.79*  --  0.41  CREATININE 1.29*  --   --   --  1.46*  --   --   TROPONINIHS 9 11  --   --   --   --   --     Estimated Creatinine Clearance: 25.9 mL/min (A) (by C-G formula based on SCr of 1.46 mg/dL (H)).   Medical History: Past Medical History:  Diagnosis Date   Arthritis    Breast cancer ([place]) 1992   right breast/radiation   Breast cancer ([place]) 1990   left breast/radiation   Breast cancer ([place]) 2014   right breast   Breast cancer, right breast ([place]) 12/16/2012   left, then right, then right breast cancers (lumpectomies and radiation therapy)   Chronic kidney disease    Dysrhythmia    HX A FIB - FOLLOWED BY [name]   GERD (gastroesophageal reflux disease)    Heart murmur    hx of years ago    Hemorrhoids    Hypertension    Personal history of radiation therapy    1990/1992   Renal cell cancer, right ([place]) 11/2014   Right Nephrectomy and Left renal ablation for lesion.    Renal insufficiency    Status post radiation therapy     breast cancer bilateral    Medications:  Infusions:   heparin 950 Units/hr (04/27/21 1300)    Assessment: 88 years of age female with acute large pulmonary embolism who was initiated on IV Heparin at [facility] then transferred for Catheter directed thrombectomy.   Heparin level 0.41 after reduction to 950 units/hr (9.5 ml/hr).  Running well without issue. No bleeding noted. No levels have yet to be drawn.    Goal of Therapy:  Heparin level 0.3-0.7 units/ml Monitor platelets by anticoagulation protocol: Yes   Plan:  Continue heparin to 950 units/hr Daily heparin level and CBC.   Sloan Leiter, PharmD, BCPS, BCCCP Clinical Pharmacist Please refer to [facility] for [place] numbers 04/27/2021, 1:28 PM

## 2021-04-27 NOTE — Progress Notes (Signed)
Patient brought to 4E from 41M. VSS. Telemetry box applied, CCMD notified. Patient states pain 0/10. CHG bath completed. Patient oriented to room and staff. Call bell in reach.  Daymon Larsen, RN

## 2021-04-27 NOTE — Progress Notes (Signed)
Acknowledging TOC consult for benefits check Eliquis vs. Xarelto. Due to holiday weekend TOC is not able to complete check before 04/29/2021.   Manya Silvas, RN MSN CCM Transitions of Care [phone]

## 2021-04-27 NOTE — Progress Notes (Signed)
NAME:  Tara Ramos, MRN:  [id number], DOB:  29-Oct-1931, LOS: 1 ADMISSION DATE:  04/26/2021, CONSULTATION DATE:  04/27/2021 REFERRING MD:  transfer from [place], CHIEF COMPLAINT:  pulmonary embolism   History of Present Illness:  Patient transferred from [facility] regional after presenting with shortness of breath starting 2 days ago, felt it was related to a cough she is out for about a week, shortness of breath progressed up to the point day where she got short of breath with every activity.  Led to presentation to the hospital Evaluation revealed large pulmonary embolism with right heart strain Transferred to [facility] for catheter thrombolytics   She is post catheter thrombectomy-tolerated well  Pertinent  Medical History   Past Medical History:  Diagnosis Date   Arthritis    Breast cancer ([place]) 1992   right breast/radiation   Breast cancer ([place]) 1990   left breast/radiation   Breast cancer ([place]) 2014   right breast   Breast cancer, right breast ([place]) 12/16/2012   left, then right, then right breast cancers (lumpectomies and radiation therapy)   Chronic kidney disease    Dysrhythmia    HX A FIB - FOLLOWED BY [name]   GERD (gastroesophageal reflux disease)    Heart murmur    hx of years ago    Hemorrhoids    Hypertension    Personal history of radiation therapy    1990/1992   Renal cell cancer, right ([place]) 11/2014   Right Nephrectomy and Left renal ablation for lesion.    Renal insufficiency    Status post radiation therapy    breast cancer bilateral   Significant Hospital Events: Including procedures, antibiotic start and stop dates in addition to other pertinent events   12/31-catheter thrombectomy by IR  Interim History / Subjective:  Looks well, feels well, breathing is better  Objective   Blood pressure (!) 133/108, pulse 71, temperature 97.7 F (36.5 C), temperature source Oral, resp. rate 20, weight 75.8 kg, SpO2 97 %.        Intake/Output Summary (Last  24 hours) at 04/27/2021 0842 Last data filed at 04/27/2021 0700 Gross per 24 hour  Intake 156.71 ml  Output 50 ml  Net 106.71 ml   Filed Weights   04/26/21 1430 04/27/21 0451  Weight: 74.7 kg 75.8 kg    Examination: General: Elderly, does not appear to be in distress, on oxygen supplementation HENT: Moist oral mucosa Lungs: Clear breath sounds Cardiovascular: S1-S2 appreciated Abdomen: Soft, bowel sounds appreciated Extremities: No clubbing, mild edema Neuro: Awake alert oriented x3 GU:   Resolved Hospital Problem list     Assessment & Plan:  Acute pulmonary embolism with right ventricular strain -S/p catheter directed thrombectomy -Tolerated well -Sheath removed 04/27/2021  Acute hypoxemic respiratory failure Secondary to large pulmonary embolism -Wean oxygen supplementation as tolerated  6 mm lung nodule -Needs radiological follow-up  History of breast cancer -Post radical mastectomy on the right 01/08/2020  Hypertension -Resume home medications including amlodipine, losartan, metoprolol, spironolactone  Hyperlipidemia -Not on statins at home  Chronic kidney disease stage IIIa -Trend electrolytes  Paroxysmal atrial fibrillation -On metoprolol  Tolerated intervention very well Best Practice (right click and "Reselect all SmartList Selections" daily)   Diet/type: Regular consistency (see orders) DVT prophylaxis: systemic heparin GI prophylaxis: PPI Lines: N/A Foley:  N/A Code Status:  full code Last date of multidisciplinary goals of care discussion [patient awake and interactive]  Labs   CBC: Recent Labs  Lab 04/26/21 0139 04/27/21 0105  04/27/21 0609  WBC 15.9* 13.5*  --   NEUTROABS 14.1*  --   --   HGB 15.9* 13.9 13.3  HCT 48.7* 42.8 39.0  MCV 92.4 92.2  --   PLT 324 268  --     Basic Metabolic Panel: Recent Labs  Lab 04/26/21 0139 04/27/21 0105 04/27/21 0609  NA 140 138 140  K 5.2* 4.4 4.0  CL 108 108  --   CO2 25 22  --   GLUCOSE  170* 111*  --   BUN 22 17  --   CREATININE 1.29* 1.46*  --   CALCIUM 9.9 8.9  --   MG  --  2.0  --   PHOS  --  5.0*  --    GFR: Estimated Creatinine Clearance: 25.9 mL/min (A) (by C-G formula based on SCr of 1.46 mg/dL (H)). Recent Labs  Lab 04/26/21 0139 04/27/21 0105  WBC 15.9* 13.5*    Liver Function Tests: No results for input(s): AST, ALT, ALKPHOS, BILITOT, PROT, ALBUMIN in the last 168 hours. No results for input(s): LIPASE, AMYLASE in the last 168 hours. No results for input(s): AMMONIA in the last 168 hours.  ABG    Component Value Date/Time   PHART 7.391 04/27/2021 0609   PCO2ART 37.2 04/27/2021 0609   PO2ART 96 04/27/2021 0609   HCO3 22.5 04/27/2021 0609   TCO2 24 04/27/2021 0609   ACIDBASEDEF 2.0 04/27/2021 0609   O2SAT 97.0 04/27/2021 0609     Coagulation Profile: Recent Labs  Lab 04/26/21 0725  INR 1.1    Cardiac Enzymes: No results for input(s): CKTOTAL, CKMB, CKMBINDEX, TROPONINI in the last 168 hours.  HbA1C: No results found for: HGBA1C  CBG: Recent Labs  Lab 04/26/21 1410 04/27/21 0706  GLUCAP 132* 89    Review of Systems:   Denies any specific complaints today  Past Medical History:  She,  has a past medical history of Arthritis, Breast cancer ([place]) (1992), Breast cancer ([place]) (1990), Breast cancer ([place]) (2014), Breast cancer, right breast ([place]) (12/16/2012), Chronic kidney disease, Dysrhythmia, GERD (gastroesophageal reflux disease), Heart murmur, Hemorrhoids, Hypertension, Personal history of radiation therapy, Renal cell cancer, right ([place]) (11/2014), Renal insufficiency, and Status post radiation therapy.   Surgical History:   Past Surgical History:  Procedure Laterality Date   BREAST BIOPSY Right 03/10/2016   -  STROMAL SCLEROSIS, GIANT CELL REACTION, CHRONIC INFLAMMATION AND FAT    BREAST BIOPSY Right 12/12/2019   [place] bx of mass, vision marker, path pending   BREAST BIOPSY Right 12/12/2019   [place] bx of LN, hydromarker, path  pending   BREAST BIOPSY Left 12/12/2019   stereo bx of calcs, coil marker, path pending   BREAST EXCISIONAL BIOPSY Right 2014   +   BREAST EXCISIONAL BIOPSY Right 1992   +   BREAST EXCISIONAL BIOPSY Left 1990   +   BREAST LUMPECTOMY Left 1990   BREAST LUMPECTOMY Right 1992   BROW LIFT Bilateral 11/10/2019   Procedure: BLEPHAROPLASTY UPPER EYELID; W/EXCESS SKIN BILATERAL & ECTROPION REPAIR, EXTENSIVE LEFT LOWER LID;  Surgeon: Karle Starch, MD;  Location: [place];  Service: Ophthalmology;  Laterality: Bilateral;   DILATION AND CURETTAGE OF UTERUS     EYE SURGERY Bilateral    cataract extraction   KYPHOPLASTY N/A 10/09/2014   Procedure: KYPHOPLASTY;  Surgeon: Hessie Knows, MD;  Location: [facility];  Service: Orthopedics;  Laterality: N/A;  T7 Kyphoplasty with bone biopsy   MASTECTOMY MODIFIED RADICAL Right 01/08/2020  Procedure: MASTECTOMY MODIFIED RADICAL;  Surgeon: Robert Bellow, MD;  Location: [facility];  Service: General;  Laterality: Right;   OTHER SURGICAL HISTORY     radiation therapy breast   ROBOT ASSISTED LAPAROSCOPIC NEPHRECTOMY Right 02/01/2015   Procedure: ROBOTIC ASSISTED LAPAROSCOPIC RIGHT NEPHRECTOMY;  Surgeon: Cleon Gustin, MD;  Location: [facility];  Service: Urology;  Laterality: Right;   SIMPLE MASTECTOMY WITH AXILLARY SENTINEL NODE BIOPSY Left 01/08/2020   Procedure: SIMPLE MASTECTOMY;  Surgeon: Robert Bellow, MD;  Location: [facility];  Service: General;  Laterality: Left;     Social History:   reports that she quit smoking about 49 years ago. Her smoking use included cigarettes. She has never used smokeless tobacco. She reports current alcohol use of about 1.0 standard drink per week. She reports that she does not use drugs.   Family History:  Her family history includes Brain cancer in her sister; Breast cancer (age of onset: 19) in her daughter; Stomach cancer in her mother; Stroke in her father. There is no history of Bladder Cancer, Prostate  cancer, or Kidney cancer.   Allergies Allergies  Allergen Reactions   Morphine And Related Nausea And Vomiting   Lisinopril Cough   Tape Other (See Comments)    Blisters underneath; wide white hypofix tape post kidney biopsy     The patient is critically ill with multiple organ systems failure and requires high complexity decision making for assessment and support, frequent evaluation and titration of therapies, application of advanced monitoring technologies and extensive interpretation of multiple databases. Critical Care Time devoted to patient care services described in this note independent of APP/resident time (if applicable)  is 35 minutes.   Sherrilyn Rist MD [facility] Personal pager: See Amion If unanswered, please page CCM On-call: [phone]

## 2021-04-27 NOTE — Progress Notes (Signed)
Referring Physician(s): Dr. Ander Slade   Supervising Physician: Markus Daft  Patient Status:  [facility] - In-pt  Chief Complaint: Bilateral PE status post catheter-directed thrombectomy 04/26/21 with Dr. Denna Haggard  Subjective: Patient sitting up in bed eating breakfast. She feels great and is hoping to go home soon.  Allergies: Morphine and related, Lisinopril, and Tape  Medications: Prior to Admission medications   Medication Sig Start Date End Date Taking? Authorizing Provider  amLODipine (NORVASC) 10 MG tablet Take 10 mg by mouth daily. 04/17/21   [provider]  amLODipine (NORVASC) 5 MG tablet Take 7.5 mg by mouth every morning. Patient not taking: Reported on 04/26/2021    [provider]  Ascorbic Acid (VITAMIN C) 1000 MG tablet Take 1,000 mg by mouth daily.     [provider]  Calcium Carb-Cholecalciferol (CALCIUM + D3) 600-200 MG-UNIT TABS Take 1 tablet by mouth daily.     [provider]  docusate sodium (COLACE) 100 MG capsule Take 100 mg by mouth at bedtime.    [provider]  losartan (COZAAR) 100 MG tablet Take 100 mg by mouth every morning.  02/10/19   [provider]  metoprolol (LOPRESSOR) 100 MG tablet Take 100 mg by mouth 2 (two) times daily.    [provider]  Multiple Vitamin (MULTIVITAMIN WITH MINERALS) TABS tablet Take 1 tablet by mouth daily.    [provider]  Multiple Vitamins-Minerals (PRESERVISION AREDS 2 PO) Take 1 capsule by mouth in the morning and at bedtime.    [provider]  spironolactone (ALDACTONE) 25 MG tablet Take 25 mg by mouth daily. 01/23/21   [provider]     Vital Signs: BP 106/75    Pulse 83    Temp 97.7 F (36.5 C) (Oral)    Resp 20    Wt 167 lb 1.7 oz (75.8 kg)    SpO2 97%    BMI 30.56 kg/m   Physical Exam Constitutional:      General: She is not in acute distress.    Appearance: She is not ill-appearing.     Comments: Patient denies  pain/discomfort.   HENT:     Mouth/Throat:     Mouth: Mucous membranes are moist.     Pharynx: Oropharynx is clear.  Cardiovascular:     Rate and Rhythm: Normal rate. Rhythm irregular.     Comments: Right groin vascular access site is clean, soft, dry and non-tender. Purse string suture intact, now removed and site covered with gauze/tape.  Pulmonary:     Effort: Pulmonary effort is normal.  Skin:    General: Skin is warm and dry.  Neurological:     Mental Status: She is alert and oriented to person, place, and time.    Imaging: DG Chest 2 View  Result Date: 04/26/2021 CLINICAL DATA:  Shortness of breath. EXAM: CHEST - 2 VIEW COMPARISON:  February 04, 2018 FINDINGS: Mild, stable left basilar linear scarring is seen. There is no evidence of acute infiltrate, pleural effusion or pneumothorax. The heart size and mediastinal contours are within normal limits. There is moderate to marked severity calcification of the aortic arch and tortuosity of the descending thoracic aorta. A chronic compression fracture deformity is again seen within the midthoracic spine. IMPRESSION: Stable left basilar linear scarring without acute or active cardiopulmonary disease. Electronically Signed   By: Virgina Norfolk M.D.   On: 04/26/2021 01:44   CT Angio Chest PE W and/or Wo Contrast  Result Date: 04/26/2021  CLINICAL DATA:  Pulmonary embolism (PE) suspected, high prob. Dyspnea, lower extremity edema. EXAM: CT ANGIOGRAPHY CHEST WITH CONTRAST TECHNIQUE: Multidetector CT imaging of the chest was performed using the standard protocol during bolus administration of intravenous contrast. Multiplanar CT image reconstructions and MIPs were obtained to evaluate the vascular anatomy. CONTRAST:  57mL OMNIPAQUE IOHEXOL 350 MG/ML SOLN COMPARISON:  None. FINDINGS: Cardiovascular: There is adequate opacification the pulmonary arterial tree. There is extensive intraluminal filling defect within the right pulmonary artery  extending into all lobar and segmental pulmonary arteries as well as within multiple segmental branches within the left upper and lower lobes in keeping with acute pulmonary embolism. The central pulmonary arteries are enlarged in keeping with pulmonary arterial hypertension. There is enlargement of the right heart with reversal of the normal RV/LV ratio (2.0). While the right ventricle appears mildly enlarged on prior examination, the degree of relative enlargement appears to of progressed in keeping with an element of acute right heart strain. Moderate coronary artery calcification. Mild global cardiomegaly. No pericardial effusion. Moderate atherosclerotic calcification within the thoracic aorta. No aortic aneurysm. Mediastinum/Nodes: Nodule within the inferior left thyroid lobe has enlarged since prior examination now measuring 19 mm on axial image # 62/6, not well characterized on this examination. No pathologic thoracic adenopathy. Esophagus is unremarkable. Lungs/Pleura: Scattered areas of airway impaction are noted within the lung bases, nonspecific. Mean 6 mm noncalcified pulmonary nodule is seen within the right upper lobe, axial image # 33/7, new since prior examination and indeterminate. The lungs are otherwise clear. No pneumothorax or pleural effusion. Central airways are widely patent. Upper Abdomen: Post believe changes noted within the left posterior pararenal space. No acute abnormality. Musculoskeletal: Remote midthoracic compression fracture noted. No acute bone abnormality. No lytic or blastic bone lesion identified. Review of the MIP images confirms the above findings. IMPRESSION: Positive for acute PE with CTevidence of right heart strain (RV/LV Ratio = 2.0) consistent with at least submassive (intermediate risk) PE. The presence of right heart strain has been associated with an increased risk of morbidity and mortality. Moderate coronary artery calcification. 19 mm enlarging nodule within the  left thyroid lobe inferiorly. Recommend thyroid US (ref: J Am Coll Radiol. 2015 Feb;12(2): 143-50). 6 mm right solid pulmonary nodule within the upper lobe. Recommend a non-contrast Chest CT at 6-12 months. If patient is high risk for malignancy, recommend an additional non-contrast Chest CT at 18-24 months; if patient is low risk for malignancy a non-contrast Chest CT at 18-24 months is optional. These guidelines do not apply to immunocompromised patients and patients with cancer. Follow up in patients with significant comorbidities as clinically warranted. For lung cancer screening, adhere to Lung-RADS guidelines. Reference: Radiology. 2017; 284(1):228-43. These results were called by telephone at the time of interpretation on 04/26/2021 at 3:43 am to provider [facility] , who verbally acknowledged these results. Electronically Signed   By: Fidela Salisbury M.D.   On: 04/26/2021 03:52   US Venous Img Lower Bilateral (DVT)  Result Date: 04/26/2021 CLINICAL DATA:  New diagnosis of acute pulmonary embolus. Lower extremity edema. Shortness of breath for 1 day. EXAM: BILATERAL LOWER EXTREMITY VENOUS DOPPLER ULTRASOUND TECHNIQUE: Gray-scale sonography with graded compression, as well as color Doppler and duplex ultrasound were performed to evaluate the lower extremity deep venous systems from the level of the common femoral vein and including the common femoral, femoral, profunda femoral, popliteal and calf veins including the posterior tibial, peroneal and gastrocnemius veins when visible. The superficial great saphenous  vein was also interrogated. Spectral Doppler was utilized to evaluate flow at rest and with distal augmentation maneuvers in the common femoral, femoral and popliteal veins. COMPARISON:  None. FINDINGS: RIGHT LOWER EXTREMITY Common Femoral Vein: No evidence of thrombus. Normal compressibility, respiratory phasicity and response to augmentation. Saphenofemoral Junction: No evidence of thrombus.  Normal compressibility and flow on color Doppler imaging. Profunda Femoral Vein: No evidence of thrombus. Normal compressibility and flow on color Doppler imaging. Femoral Vein: No evidence of thrombus. Normal compressibility, respiratory phasicity and response to augmentation. Popliteal Vein: No evidence of thrombus. Normal compressibility, respiratory phasicity and response to augmentation. Calf Veins: No evidence of thrombus. Normal compressibility and flow on color Doppler imaging. Superficial Great Saphenous Vein: No evidence of thrombus. Normal compressibility. Venous Reflux:  None. Other Findings:  None. LEFT LOWER EXTREMITY Common Femoral Vein: No evidence of thrombus. Normal compressibility, respiratory phasicity and response to augmentation. Saphenofemoral Junction: No evidence of thrombus. Normal compressibility and flow on color Doppler imaging. Profunda Femoral Vein: No evidence of thrombus. Normal compressibility and flow on color Doppler imaging. Femoral Vein: No evidence of thrombus. Normal compressibility, respiratory phasicity and response to augmentation. Popliteal Vein: Nonocclusive thrombus is identified within the popliteal vein. There is resultant loss of compressibility of the affected segment of vein. Calf Veins: No evidence of thrombus. Normal compressibility and flow on color Doppler imaging. Superficial Great Saphenous Vein: No evidence of thrombus. Normal compressibility. Venous Reflux:  None. Other Findings:  None. IMPRESSION: 1. Examination is positive for nonocclusive DVT in the left popliteal vein. 2. No evidence of DVT in right lower extremity. Electronically Signed   By: Kerby Moors M.D.   On: 04/26/2021 12:32    Labs:  CBC: Recent Labs    04/26/21 0139 04/27/21 0105 04/27/21 0609  WBC 15.9* 13.5*  --   HGB 15.9* 13.9 13.3  HCT 48.7* 42.8 39.0  PLT 324 268  --     COAGS: Recent Labs    04/26/21 0725  INR 1.1  APTT 118*    BMP: Recent Labs     04/26/21 0139 04/27/21 0105 04/27/21 0609  NA 140 138 140  K 5.2* 4.4 4.0  CL 108 108  --   CO2 25 22  --   GLUCOSE 170* 111*  --   BUN 22 17  --   CALCIUM 9.9 8.9  --   CREATININE 1.29* 1.46*  --   GFRNONAA 40* 35*  --     LIVER FUNCTION TESTS: No results for input(s): BILITOT, AST, ALT, ALKPHOS, PROT, ALBUMIN in the last 8760 hours.  Assessment and Plan:  Bilateral PE s/p catheter-directed thrombectomy 04/26/21: Patient tolerated the procedure well and has no complaints this morning. Her labs and vitals are within acceptable limits and she's breathing more comfortably. Right groin is clean, soft, dry and non-tender. Purse-string suture was removed and the site was covered with gauze/tape. Patient monitored for a few minutes post-suture removal with no bleeding observed.   [place] for patient to sit up and get out of bed as tolerated. Please call IR with any questions.   Electronically Signed: Soyla Dryer, AGACNP-BC [phone] 04/27/2021, 9:26 AM   I spent a total of 15 Minutes at the the patient's bedside AND on the patient's hospital floor or unit, greater than 50% of which was counseling/coordinating care for catheter-directed PE thrombectomy

## 2021-04-27 NOTE — Progress Notes (Signed)
[place] for IV Heparin (transferred on therapy from [facility]) Indication: pulmonary embolus  Allergies  Allergen Reactions   Morphine And Related Nausea And Vomiting   Lisinopril Cough   Tape Other (See Comments)    Blisters underneath; wide white hypofix tape post kidney biopsy     Patient Measurements: Weight: 74.7 kg (164 lb 10.9 oz) Heparin Dosing Weight: 66 kg  Vital Signs: Temp: 97.4 F (36.3 C) (01/01 0000) Temp Source: Axillary (01/01 0000) BP: 133/89 (01/01 0200) Pulse Rate: 92 (01/01 0200)  Labs: Recent Labs    04/26/21 0139 04/26/21 0334 04/26/21 0725 04/26/21 1552 04/27/21 0105  HGB 15.9*  --   --   --  13.9  HCT 48.7*  --   --   --  42.8  PLT 324  --   --   --  268  APTT  --   --  118*  --   --   LABPROT  --   --  14.1  --   --   INR  --   --  1.1  --   --   HEPARINUNFRC  --   --   --  0.38 0.79*  CREATININE 1.29*  --   --   --  1.46*  TROPONINIHS 9 11  --   --   --      Estimated Creatinine Clearance: 25.7 mL/min (A) (by C-G formula based on SCr of 1.46 mg/dL (H)).   Medical History: Past Medical History:  Diagnosis Date   Arthritis    Breast cancer ([place]) 1992   right breast/radiation   Breast cancer ([place]) 1990   left breast/radiation   Breast cancer ([place]) 2014   right breast   Breast cancer, right breast ([place]) 12/16/2012   left, then right, then right breast cancers (lumpectomies and radiation therapy)   Chronic kidney disease    Dysrhythmia    HX A FIB - FOLLOWED BY [name]   GERD (gastroesophageal reflux disease)    Heart murmur    hx of years ago    Hemorrhoids    Hypertension    Personal history of radiation therapy    1990/1992   Renal cell cancer, right ([place]) 11/2014   Right Nephrectomy and Left renal ablation for lesion.    Renal insufficiency    Status post radiation therapy    breast cancer bilateral    Medications:  Infusions:   heparin 1,100 Units/hr (04/27/21 0200)     Assessment: 88 years of age female with acute large pulmonary embolism who was initiated on IV Heparin ~5 AM at [facility] then transferred for catheter directed lysis. IV heparin currently running at 1100 units/hr (11 ml/hr). Running well without issue. No bleeding noted. No levels have yet to be drawn. Now s/p lysis in IR this afternoon. Orders to continue heparin for now.   Heparin level at goal drawn prior to IR thrombolysis. Will continue at current rate.   1/1 AM update:  Heparin level elevated  Goal of Therapy:  Heparin level 0.3-0.7 units/ml Monitor platelets by anticoagulation protocol: Yes   Plan:  Dec heparin to 950 units/hr 1100 heparin level  Narda Bonds, PharmD, BCPS Clinical Pharmacist Phone: [phone]

## 2021-04-28 ENCOUNTER — Other Ambulatory Visit (HOSPITAL_COMMUNITY): Payer: Self-pay

## 2021-04-28 DIAGNOSIS — J9602 Acute respiratory failure with hypercapnia: Secondary | ICD-10-CM

## 2021-04-28 DIAGNOSIS — I4821 Permanent atrial fibrillation: Secondary | ICD-10-CM

## 2021-04-28 DIAGNOSIS — I10 Essential (primary) hypertension: Secondary | ICD-10-CM

## 2021-04-28 DIAGNOSIS — J9601 Acute respiratory failure with hypoxia: Secondary | ICD-10-CM

## 2021-04-28 LAB — CBC
HCT: 40.4 % (ref 36.0–46.0)
Hemoglobin: 12.9 g/dL (ref 12.0–15.0)
MCH: 29.7 pg (ref 26.0–34.0)
MCHC: 31.9 g/dL (ref 30.0–36.0)
MCV: 93.1 fL (ref 80.0–100.0)
Platelets: 248 10*3/uL (ref 150–400)
RBC: 4.34 MIL/uL (ref 3.87–5.11)
RDW: 13.9 % (ref 11.5–15.5)
WBC: 9.8 10*3/uL (ref 4.0–10.5)
nRBC: 0 % (ref 0.0–0.2)

## 2021-04-28 LAB — HEPARIN LEVEL (UNFRACTIONATED): Heparin Unfractionated: 0.26 IU/mL — ABNORMAL LOW (ref 0.30–0.70)

## 2021-04-28 MED ORDER — APIXABAN 5 MG PO TABS
10.0000 mg | ORAL_TABLET | Freq: Two times a day (BID) | ORAL | Status: DC
  Administered 2021-04-28 – 2021-04-29 (×3): 10 mg via ORAL
  Filled 2021-04-28 (×3): qty 2

## 2021-04-28 MED ORDER — APIXABAN (ELIQUIS) VTE STARTER PACK (10MG AND 5MG)
ORAL_TABLET | ORAL | 0 refills | Status: DC
  Filled 2021-04-28: qty 74, 30d supply, fill #0

## 2021-04-28 MED ORDER — APIXABAN 5 MG PO TABS: 5.0000 mg | ORAL_TABLET | Freq: Two times a day (BID) | ORAL | Status: DC

## 2021-04-28 NOTE — Progress Notes (Addendum)
stopped by to see patient  Encouraged her to stay in the hospital for optimal management before discharge  Can be discharged on a DOAC -Eliquis  Came in with high risk submassive pulmonary embolism for which she had thrombectomy-significant for extensive clot retrieval  Favor potential discharge 04/29/2021

## 2021-04-28 NOTE — TOC Benefit Eligibility Note (Signed)
Patient Advocate Encounter ° °Insurance verification completed.   ° °The patient is currently admitted and upon discharge could be taking Eliquis 5 mg. ° °The current 30 day co-pay is, $45.00.  ° °The patient is currently admitted and upon discharge could be taking Xarelto 20 mg. ° °The current 30 day co-pay is, $45.00.  ° °The patient is insured through [employer] Medicare Part D  ° ° ° °Adelis Docter, CPhT °Pharmacy Patient Advocate Specialist °[facility] Patient Advocate Team °Direct Number: [phone]  Fax: [phone] ° ° ° ° ° °  °

## 2021-04-28 NOTE — Discharge Instructions (Signed)
Information on my medicine - ELIQUIS (apixaban)  Why was Eliquis prescribed for you? Eliquis was prescribed to treat blood clots that may have been found in the veins of your legs (deep vein thrombosis) or in your lungs (pulmonary embolism) and to reduce the risk of them occurring again.  What do You need to know about Eliquis ? The starting dose is 10 mg (two 5 mg tablets) taken TWICE daily for the FIRST SEVEN (7) DAYS, then on (enter date)  05/05/2021  the dose is reduced to ONE 5 mg tablet taken TWICE daily.  Eliquis may be taken with or without food.   Try to take the dose about the same time in the morning and in the evening. If you have difficulty swallowing the tablet whole please discuss with your pharmacist how to take the medication safely.  Take Eliquis exactly as prescribed and DO NOT stop taking Eliquis without talking to the doctor who prescribed the medication.  Stopping may increase your risk of developing a new blood clot.  Refill your prescription before you run out.  After discharge, you should have regular check-up appointments with your healthcare provider that is prescribing your Eliquis.    What do you do if you miss a dose? If a dose of ELIQUIS is not taken at the scheduled time, take it as soon as possible on the same day and twice-daily administration should be resumed. The dose should not be doubled to make up for a missed dose.  Important Safety Information A possible side effect of Eliquis is bleeding. You should call your healthcare provider right away if you experience any of the following: Bleeding from an injury or your nose that does not stop. Unusual colored urine (red or dark brown) or unusual colored stools (red or black). Unusual bruising for unknown reasons. A serious fall or if you hit your head (even if there is no bleeding).  Some medicines may interact with Eliquis and might increase your risk of bleeding or clotting while on Eliquis. To  help avoid this, consult your healthcare provider or pharmacist prior to using any new prescription or non-prescription medications, including herbals, vitamins, non-steroidal anti-inflammatory drugs (NSAIDs) and supplements.  This website has more information on Eliquis (apixaban): http://[email or web address].com/eliquis/home

## 2021-04-28 NOTE — Progress Notes (Signed)
°  Transition of Care [facility]) Screening Note   Patient Details  Name: Tara Ramos Date of Birth: [birth year]   Transition of Care [facility]) CM/SW Contact:    Dawayne Patricia, RN Phone Number: 04/28/2021, 11:56 AM    Transition of Care Department [facility]) has reviewed patient and no TOC needs have been identified at this time. Benefits check completed for [employer]- copay cost $45. We will continue to monitor patient advancement through interdisciplinary progression rounds. If new patient transition needs arise, please place a TOC consult.

## 2021-04-28 NOTE — Progress Notes (Signed)
[facility] PROGRESS NOTE    Progress Note  Tara Ramos  XHB:[id number] DOB: 05/07/1931 DOA: 04/26/2021 PCP: Adin Hector, MD     Brief Narrative:   Tara Ramos is an 88 y.o. female past medical history of breast cancer status postmastectomy and radiation, renal cell carcinoma status post right nephrectomy, left ablation chronic kidney disease stage III paroxysmal atrial fibrillation on anticoagulation came in with shortness of breath with diagnosed with massive or submassive PE with right heart strain admitted to PCCM she status post thrombectomy and on IV heparin.   Assessment/Plan:   Acute respiratory failure with hypoxia due to acute pulmonary embolism with right heart strain: Status post diuretic thrombectomy. Sheath was removed on 04/27/2021. She has been weaned to room air. Transitioned her to oral DOAC.  6 mm nodule: Will need to follow-up CT scan in 6 to 12 months.  History of breast cancer: Noted.  Essential hypertension: Resume all of her antihypertensive medication Norvasc, losartan metoprolol and Aldactone.  Her blood pressure is well controlled.  Hyperlipidemia:  Chronic kidney disease stage IIIa: At baseline creatinine.  Paroxysmal atrial fibrillation: Rate controlled metoprolol continue Eliquis.   DVT prophylaxis: Eliquis Family Communication: None Status is: Inpatient  Remains inpatient appropriate because: Acute respiratory failure with hypoxia due to PE        Code Status:     Code Status Orders  (From admission, onward)           Start     Ordered   04/26/21 [place]  Full code  Continuous        04/26/21 1535           Code Status History     Date Active Date Inactive Code Status Order ID Comments User Context   04/26/2021 0932 04/26/2021 1400 Full Code [id number]  Ivor Costa, MD ED   02/01/2015 1832 02/02/2015 1947 Full Code [id number]  Lattie Corns Inpatient   10/31/2014 1105 11/01/2014 1304 Full Code  [id number]  Hollice Espy, MD HOV   10/31/2014 1028 10/31/2014 1105 Full Code [id number]  Sabino Dick, MD HOV   10/09/2014 1556 10/09/2014 2016 Full Code [id number]  Hessie Knows, MD Inpatient         IV Access:   Peripheral IV   Procedures and diagnostic studies:   US Venous Img Lower Bilateral (DVT)  Result Date: 04/26/2021 CLINICAL DATA:  New diagnosis of acute pulmonary embolus. Lower extremity edema. Shortness of breath for 1 day. EXAM: BILATERAL LOWER EXTREMITY VENOUS DOPPLER ULTRASOUND TECHNIQUE: Gray-scale sonography with graded compression, as well as color Doppler and duplex ultrasound were performed to evaluate the lower extremity deep venous systems from the level of the common femoral vein and including the common femoral, femoral, profunda femoral, popliteal and calf veins including the posterior tibial, peroneal and gastrocnemius veins when visible. The superficial great saphenous vein was also interrogated. Spectral Doppler was utilized to evaluate flow at rest and with distal augmentation maneuvers in the common femoral, femoral and popliteal veins. COMPARISON:  None. FINDINGS: RIGHT LOWER EXTREMITY Common Femoral Vein: No evidence of thrombus. Normal compressibility, respiratory phasicity and response to augmentation. Saphenofemoral Junction: No evidence of thrombus. Normal compressibility and flow on color Doppler imaging. Profunda Femoral Vein: No evidence of thrombus. Normal compressibility and flow on color Doppler imaging. Femoral Vein: No evidence of thrombus. Normal compressibility, respiratory phasicity and response to augmentation. Popliteal Vein: No evidence of thrombus. Normal compressibility, respiratory phasicity and response to augmentation. Calf Veins: No  evidence of thrombus. Normal compressibility and flow on color Doppler imaging. Superficial Great Saphenous Vein: No evidence of thrombus. Normal compressibility. Venous Reflux:  None. Other Findings:  None. LEFT LOWER  EXTREMITY Common Femoral Vein: No evidence of thrombus. Normal compressibility, respiratory phasicity and response to augmentation. Saphenofemoral Junction: No evidence of thrombus. Normal compressibility and flow on color Doppler imaging. Profunda Femoral Vein: No evidence of thrombus. Normal compressibility and flow on color Doppler imaging. Femoral Vein: No evidence of thrombus. Normal compressibility, respiratory phasicity and response to augmentation. Popliteal Vein: Nonocclusive thrombus is identified within the popliteal vein. There is resultant loss of compressibility of the affected segment of vein. Calf Veins: No evidence of thrombus. Normal compressibility and flow on color Doppler imaging. Superficial Great Saphenous Vein: No evidence of thrombus. Normal compressibility. Venous Reflux:  None. Other Findings:  None. IMPRESSION: 1. Examination is positive for nonocclusive DVT in the left popliteal vein. 2. No evidence of DVT in right lower extremity. Electronically Signed   By: Kerby Moors M.D.   On: 04/26/2021 12:32   IR THROMBECT PRIM MECH INIT (INCLU) MOD SED  Result Date: 04/27/2021 INDICATION: Pulmonary embolism, intermediate low risk pulmonary embolism with symptoms including shortness of breath with minimal activity EXAM: 1. Ultrasound-guided puncture of the right common femoral vein 2. Catheterization and venography of the right pulmonary artery 3. Mechanical/aspiration thrombectomy of the right pulmonary artery 4. Pressure measurements of the right pulmonary artery COMPARISON:  CTA chest same day MEDICATIONS: None. ANESTHESIA/SEDATION: Moderate (conscious) sedation was employed during this procedure. A total of Versed 2 mg and Fentanyl 75 mcg was administered intravenously by the radiology nurse. Total intra-service moderate Sedation Time: 66 minutes. The patient's level of consciousness and vital signs were monitored continuously by radiology nursing throughout the procedure under my direct  supervision. FLUOROSCOPY TIME:  FLUOROSCOPY TIME Fluoroscopy Time: 23.1 minutes with 5 exposures COMPLICATIONS: None immediate. TECHNIQUE: Informed written consent was obtained from the patient after a thorough discussion of the procedural risks, benefits and alternatives. All questions were addressed. Maximal Sterile Barrier Technique was utilized including caps, mask, sterile gowns, sterile gloves, sterile drape, hand hygiene and skin antiseptic. A timeout was performed prior to the initiation of the procedure. The patient was placed supine on the exam table. The right groin was prepped and draped in the standard sterile fashion. Ultrasound demonstrated a widely patent right common femoral vein. A permanent image was stored in the electronic medical record. Under ultrasound guidance, the right common femoral vein was directly punctured using a 21 gauge micropuncture set with visualization of needle entry into the vessel lumen. The access site was serially dilated to accommodate a short 6 [place] sheath. Wire was advanced centrally into the IVC, and initial attempts were made to catheterize the pulmonary artery using an angled pigtail catheter. Due to tortuosity of the pulmonary artery bifurcation, this was initially unsuccessful. Eventually, the right pulmonary artery was able to be catheterized using a combination of a long 6 [place] sheath, reinforced angled catheter and Bentson guidewire. Pulmonary artery angiogram of the right pulmonary artery was performed, demonstrating large volume clot in the distal aspect of the main right pulmonary artery. Wire was exchanged for a short tapered Amplatz guidewire, over which a 24 French sheath was introduced into the access site. While maintaining wire position, an Inari 24 [place] FlowTriever aspiration thrombectomy catheter was advanced into the more distal right pulmonary artery. Multiple aspiration thrombectomy passes were then performed of the right pulmonary artery.  Approximally 5 passes  were performed, with blood returned using the FlowSaver syringe. Large amounts of acute and subacute clot was retrieved. Post thrombectomy right pulmonary artery angiogram was then performed through the aspiration catheter, demonstrating near-complete resolution of the proximal clot, with small volume residual subsegmental embolism, not amenable to further aspiration thrombectomy. Pulmonary artery pressures were then transducer, which were estimated at 58/23 (mean 35) mm Hg. Although this was still relatively elevated compared to normal values, the patient demonstrated clinical relief with improved shortness of breath nearly immediately after the thrombectomy, with concordant improvement in the patient's vital signs including improved oxygenation and resolution of tachycardia. Given these improvements and known low volume clot in the left pulmonary artery, the procedure was stopped at this point. All wires and catheters were removed. Hemostasis was achieved at the access site using a pursestring suture. A clean dressing was placed. The patient tolerated the procedure well without immediate complication, and was returned to per original disposition in the critical care unit in stable condition. FINDINGS: As above. IMPRESSION: Successful catheter directed mechanical/aspiration thrombectomy of the right pulmonary artery using the Inari FlowTriever device, with large volume of acute and subacute clot removed. The patient reported near immediate improvement in clinical symptomatology, with concordant improvement in the patient's vital signs including improved oxygenation and resolution of tachycardia. Electronically Signed   By: Albin Felling M.D.   On: 04/27/2021 10:52   IR US Guide Vasc Access Right  Result Date: 04/27/2021 INDICATION: Pulmonary embolism, intermediate low risk pulmonary embolism with symptoms including shortness of breath with minimal activity EXAM: 1. Ultrasound-guided  puncture of the right common femoral vein 2. Catheterization and venography of the right pulmonary artery 3. Mechanical/aspiration thrombectomy of the right pulmonary artery 4. Pressure measurements of the right pulmonary artery COMPARISON:  CTA chest same day MEDICATIONS: None. ANESTHESIA/SEDATION: Moderate (conscious) sedation was employed during this procedure. A total of Versed 2 mg and Fentanyl 75 mcg was administered intravenously by the radiology nurse. Total intra-service moderate Sedation Time: 66 minutes. The patient's level of consciousness and vital signs were monitored continuously by radiology nursing throughout the procedure under my direct supervision. FLUOROSCOPY TIME:  FLUOROSCOPY TIME Fluoroscopy Time: 23.1 minutes with 5 exposures COMPLICATIONS: None immediate. TECHNIQUE: Informed written consent was obtained from the patient after a thorough discussion of the procedural risks, benefits and alternatives. All questions were addressed. Maximal Sterile Barrier Technique was utilized including caps, mask, sterile gowns, sterile gloves, sterile drape, hand hygiene and skin antiseptic. A timeout was performed prior to the initiation of the procedure. The patient was placed supine on the exam table. The right groin was prepped and draped in the standard sterile fashion. Ultrasound demonstrated a widely patent right common femoral vein. A permanent image was stored in the electronic medical record. Under ultrasound guidance, the right common femoral vein was directly punctured using a 21 gauge micropuncture set with visualization of needle entry into the vessel lumen. The access site was serially dilated to accommodate a short 6 [place] sheath. Wire was advanced centrally into the IVC, and initial attempts were made to catheterize the pulmonary artery using an angled pigtail catheter. Due to tortuosity of the pulmonary artery bifurcation, this was initially unsuccessful. Eventually, the right pulmonary  artery was able to be catheterized using a combination of a long 6 [place] sheath, reinforced angled catheter and Bentson guidewire. Pulmonary artery angiogram of the right pulmonary artery was performed, demonstrating large volume clot in the distal aspect of the main right pulmonary artery. Wire was exchanged  for a short tapered Amplatz guidewire, over which a 24 French sheath was introduced into the access site. While maintaining wire position, an Inari 24 [place] FlowTriever aspiration thrombectomy catheter was advanced into the more distal right pulmonary artery. Multiple aspiration thrombectomy passes were then performed of the right pulmonary artery. Approximally 5 passes were performed, with blood returned using the FlowSaver syringe. Large amounts of acute and subacute clot was retrieved. Post thrombectomy right pulmonary artery angiogram was then performed through the aspiration catheter, demonstrating near-complete resolution of the proximal clot, with small volume residual subsegmental embolism, not amenable to further aspiration thrombectomy. Pulmonary artery pressures were then transducer, which were estimated at 58/23 (mean 35) mm Hg. Although this was still relatively elevated compared to normal values, the patient demonstrated clinical relief with improved shortness of breath nearly immediately after the thrombectomy, with concordant improvement in the patient's vital signs including improved oxygenation and resolution of tachycardia. Given these improvements and known low volume clot in the left pulmonary artery, the procedure was stopped at this point. All wires and catheters were removed. Hemostasis was achieved at the access site using a pursestring suture. A clean dressing was placed. The patient tolerated the procedure well without immediate complication, and was returned to per original disposition in the critical care unit in stable condition. FINDINGS: As above. IMPRESSION: Successful  catheter directed mechanical/aspiration thrombectomy of the right pulmonary artery using the Inari FlowTriever device, with large volume of acute and subacute clot removed. The patient reported near immediate improvement in clinical symptomatology, with concordant improvement in the patient's vital signs including improved oxygenation and resolution of tachycardia. Electronically Signed   By: Albin Felling M.D.   On: 04/27/2021 10:52     Medical Consultants:   None.   Subjective:    Tara Ramos was frustrated this morning as she could not get out of bed wanted to go home she has decided to stay.  Objective:    Vitals:   04/27/21 2325 04/28/21 0334 04/28/21 0400 04/28/21 0813  BP: 103/76 139/87  (!) 150/83  Pulse: 70 78  100  Resp: 18 20  17   Temp: 98.1 F (36.7 C) 98 F (36.7 C)  98.2 F (36.8 C)  TempSrc: Oral Oral  Oral  SpO2: 97% 98%  95%  Weight:   75.4 kg   Height:   5\' 2"  (1.575 m)    SpO2: 95 % O2 Flow Rate (L/min): 2 L/min   Intake/Output Summary (Last 24 hours) at 04/28/2021 1029 Last data filed at 04/28/2021 0345 Gross per 24 hour  Intake 47.48 ml  Output 650 ml  Net -602.52 ml   Filed Weights   04/26/21 1430 04/27/21 0451 04/28/21 0400  Weight: 74.7 kg 75.8 kg 75.4 kg    Exam: General exam: In no acute distress. Respiratory system: Good air movement and clear to auscultation. Cardiovascular system: S1 & S2 heard, RRR. No JVD. Gastrointestinal system: Abdomen is nondistended, soft and nontender.  Extremities: No pedal edema. Skin: No rashes, lesions or ulcers Psychiatry: Judgement and insight appear normal. Mood & affect appropriate.    Data Reviewed:    Labs: Basic Metabolic Panel: Recent Labs  Lab 04/26/21 0139 04/27/21 0105 04/27/21 0609  NA 140 138 140  K 5.2* 4.4 4.0  CL 108 108  --   CO2 25 22  --   GLUCOSE 170* 111*  --   BUN 22 17  --   CREATININE 1.29* 1.46*  --   CALCIUM 9.9 8.9  --  MG  --  2.0  --   PHOS  --  5.0*  --     GFR Estimated Creatinine Clearance: 25.8 mL/min (A) (by C-G formula based on SCr of 1.46 mg/dL (H)). Liver Function Tests: No results for input(s): AST, ALT, ALKPHOS, BILITOT, PROT, ALBUMIN in the last 168 hours. No results for input(s): LIPASE, AMYLASE in the last 168 hours. No results for input(s): AMMONIA in the last 168 hours. Coagulation profile Recent Labs  Lab 04/26/21 0725  INR 1.1   COVID-19 Labs  No results for input(s): DDIMER, FERRITIN, LDH, CRP in the last 72 hours.  Lab Results  Component Value Date   SARSCOV2NAA NEGATIVE 04/26/2021   SARSCOV2NAA NEGATIVE 01/04/2020   [place] NEGATIVE 11/08/2019    CBC: Recent Labs  Lab 04/26/21 0139 04/27/21 0105 04/27/21 0609 04/28/21 0449  WBC 15.9* 13.5*  --  9.8  NEUTROABS 14.1*  --   --   --   HGB 15.9* 13.9 13.3 12.9  HCT 48.7* 42.8 39.0 40.4  MCV 92.4 92.2  --  93.1  PLT 324 268  --  248   Cardiac Enzymes: No results for input(s): CKTOTAL, CKMB, CKMBINDEX, TROPONINI in the last 168 hours. BNP (last 3 results) No results for input(s): PROBNP in the last 8760 hours. CBG: Recent Labs  Lab 04/26/21 1410 04/27/21 0706  GLUCAP 132* 89   D-Dimer: No results for input(s): DDIMER in the last 72 hours. Hgb A1c: No results for input(s): HGBA1C in the last 72 hours. Lipid Profile: No results for input(s): CHOL, HDL, LDLCALC, TRIG, CHOLHDL, LDLDIRECT in the last 72 hours. Thyroid function studies: No results for input(s): TSH, T4TOTAL, T3FREE, THYROIDAB in the last 72 hours.  Invalid input(s): FREET3 Anemia work up: No results for input(s): VITAMINB12, FOLATE, FERRITIN, TIBC, IRON, RETICCTPCT in the last 72 hours. Sepsis Labs: Recent Labs  Lab 04/26/21 0139 04/27/21 0105 04/28/21 0449  WBC 15.9* 13.5* 9.8   Microbiology Recent Results (from the past 240 hour(s))  Resp Panel by RT-PCR (Flu A&B, Covid) Nasopharyngeal Swab     Status: None   Collection Time: 04/26/21  1:39 AM   Specimen:  Nasopharyngeal Swab; Nasopharyngeal(NP) swabs in vial transport medium  Result Value Ref Range Status   SARS Coronavirus 2 by RT PCR NEGATIVE NEGATIVE Final    Comment: (NOTE) SARS-CoV-2 target nucleic acids are NOT DETECTED.  The SARS-CoV-2 RNA is generally detectable in upper respiratory specimens during the acute phase of infection. The lowest concentration of SARS-CoV-2 viral copies this assay can detect is 138 copies/mL. A negative result does not preclude SARS-Cov-2 infection and should not be used as the sole basis for treatment or other patient management decisions. A negative result may occur with  improper specimen collection/handling, submission of specimen other than nasopharyngeal swab, presence of viral mutation(s) within the areas targeted by this assay, and inadequate number of viral copies(<138 copies/mL). A negative result must be combined with clinical observations, patient history, and epidemiological information. The expected result is Negative.  Fact Sheet for Patients:  EntrepreneurPulse.com.au  Fact Sheet for Healthcare Providers:  IncredibleEmployment.be  This test is no t yet approved or cleared by the [place] FDA and  has been authorized for detection and/or diagnosis of SARS-CoV-2 by FDA under an Emergency Use Authorization (EUA). This EUA will remain  in effect (meaning this test can be used) for the duration of the COVID-19 declaration under Section 564(b)(1) of the Act, 21 U.S.C.section 360bbb-3(b)(1), unless the authorization is terminated  or revoked sooner.       Influenza A by PCR NEGATIVE NEGATIVE Final   Influenza B by PCR NEGATIVE NEGATIVE Final    Comment: (NOTE) The Xpert Xpress SARS-CoV-2/FLU/RSV plus assay is intended as an aid in the diagnosis of influenza from Nasopharyngeal swab specimens and should not be used as a sole basis for treatment. Nasal washings and aspirates are unacceptable for  Xpert Xpress SARS-CoV-2/FLU/RSV testing.  Fact Sheet for Patients: EntrepreneurPulse.com.au  Fact Sheet for Healthcare Providers: IncredibleEmployment.be  This test is not yet approved or cleared by the [place] FDA and has been authorized for detection and/or diagnosis of SARS-CoV-2 by FDA under an Emergency Use Authorization (EUA). This EUA will remain in effect (meaning this test can be used) for the duration of the COVID-19 declaration under Section 564(b)(1) of the Act, 21 U.S.C. section 360bbb-3(b)(1), unless the authorization is terminated or revoked.  Performed at [facility], [address] [place], [place] [address]   MRSA Next Gen by PCR, Nasal     Status: None   Collection Time: 04/26/21  3:31 PM   Specimen: Nasal Mucosa; Nasal Swab  Result Value Ref Range Status   MRSA by PCR Next Gen NOT DETECTED NOT DETECTED Final    Comment: (NOTE) The GeneXpert MRSA Assay (FDA approved for NASAL specimens only), is one component of a comprehensive MRSA colonization surveillance program. It is not intended to diagnose MRSA infection nor to guide or monitor treatment for MRSA infections. Test performance is not FDA approved in patients less than 37 years old. Performed at [facility], [address] [place], [place] [address]      Medications:    amLODipine  10 mg Oral Daily   vitamin C  1,000 mg Oral Daily   calcium-vitamin D  1 tablet Oral Q breakfast   Chlorhexidine Gluconate Cloth  6 each Topical Q0600   docusate sodium  100 mg Oral QHS   losartan  100 mg Oral [id number]   metoprolol tartrate  100 mg Oral BID   multivitamin with minerals  1 tablet Oral Daily   spironolactone  25 mg Oral Daily   Continuous Infusions:  heparin 1,000 Units/hr (04/28/21 0810)      LOS: 2 days   Charlynne Cousins  [facility]  04/28/2021, 10:29 AM

## 2021-04-28 NOTE — Progress Notes (Addendum)
ANTICOAGULATION CONSULT NOTE - Follow Up Consult  Pharmacy Consult for Heparin>>Eliquis Indication:  DVT and PE  Allergies  Allergen Reactions   Morphine And Related Nausea And Vomiting   Lisinopril Cough   Tape Other (See Comments)    Blisters underneath; wide white hypofix tape post kidney biopsy     Patient Measurements: Height: 5\' 2"  (157.5 cm) Weight: 75.4 kg (166 lb 3.2 oz) IBW/kg (Calculated) : 50.1 Heparin Dosing Weight: 66.5 kg  Vital Signs: Temp: 98 F (36.7 C) (01/02 0334) Temp Source: Oral (01/02 0334) BP: 139/87 (01/02 0334) Pulse Rate: 78 (01/02 0334)  Labs: Recent Labs    04/26/21 0139 04/26/21 0334 04/26/21 0725 04/26/21 1552 04/27/21 0105 04/27/21 0609 04/27/21 1128 04/28/21 0449  HGB 15.9*  --   --   --  13.9 13.3  --  12.9  HCT 48.7*  --   --   --  42.8 39.0  --  40.4  PLT 324  --   --   --  268  --   --  248  APTT  --   --  118*  --   --   --   --   --   LABPROT  --   --  14.1  --   --   --   --   --   INR  --   --  1.1  --   --   --   --   --   HEPARINUNFRC  --   --   --    < > 0.79*  --  0.41 0.26*  CREATININE 1.29*  --   --   --  1.46*  --   --   --   TROPONINIHS 9 11  --   --   --   --   --   --    < > = values in this interval not displayed.    Estimated Creatinine Clearance: 25.8 mL/min (A) (by C-G formula based on SCr of 1.46 mg/dL (H)).   Assessment: Anticoag: Acute large PE with R-heart strain (RV/LV =2) for catheter directed thrombectomy - very large amount of clot removed from R-PA. No lysis.Hep level 0.26 low. Hgb 12.9 WNL. Plts 248 WNL -LE dopplers + for nonocclusive DVT in the left popliteal vein.  Goal of Therapy:  Heparin level 0.3-0.7 units/ml Monitor platelets by anticoagulation protocol: Yes   Plan:  Increase IV heparin to 1000 units/hr Recheck in 8 hrs Daily HL and CBC Follow-up transition to oral AC Copay check in process   Sajjad Honea S. Alford Highland, PharmD, BCPS Clinical Staff Pharmacist [email or web address] Alford Highland,  Shaily Librizzi Stillinger 04/28/2021,7:34 AM  1045:  Adden: Transition to Eliquis 10mg  BID x7d then 5mg  BID Copay $45. Sent start pack to [place]

## 2021-04-28 NOTE — Evaluation (Signed)
Physical Therapy Evaluation Patient Details Name: Tara Ramos MRN: [id number] DOB: 05-10-1931 Today's Date: 04/28/2021  History of Present Illness  The pt is an 88 yo female presenting 12/31 after being transferred from [place] [facility] due to SOB and concern for PE. Chest CT revealed large PE with R heart strain. S/p thrombectomy 12/31. PMH includes: arthritis, breast cancer, CKD, a fib, and HTN.   Clinical Impression  Pt in bed upon arrival of PT, agreeable to evaluation at this time. Prior to admission the pt was completely independent, walking without DME, driving, and working at [employer] 2 days/week. The pt now presents with minor deficits in dynamic stability and activity tolerance, but id generally safe to return home as she has good family support and access to any needed DME. The pt was able to complete ~200 ft hallway ambulation and maintained stability without assist or UE support. She did have single LOB related to R knee pain, but was able to self-correct. Recommend return home with family support and no follow-up therapies as pt is very close to her functional baseline. Encouraged continued mobility and progressive return to activities after return home. Will benefit from acute PT to progress dynamic stability and independence, but is safe for d/c once medically cleared.         Recommendations for follow up therapy are one component of a multi-disciplinary discharge planning process, led by the attending physician.  Recommendations may be updated based on patient status, additional functional criteria and insurance authorization.  Follow Up Recommendations No PT follow up    Assistance Recommended at Discharge Intermittent Supervision/Assistance  Functional Status Assessment Patient has had a recent decline in their functional status and demonstrates the ability to make significant improvements in function in a reasonable and predictable amount of time.  Equipment  Recommendations  None recommended by PT (pt well-equipped)    Recommendations for Other Services       Precautions / Restrictions Precautions Precautions: Fall Restrictions Weight Bearing Restrictions: No      Mobility  Bed Mobility Overal bed mobility: Independent                  Transfers Overall transfer level: Needs assistance Equipment used: None Transfers: Sit to/from Stand Sit to Stand: Supervision           General transfer comment: supervision for safety, no overt LOB    Ambulation/Gait Ambulation/Gait assistance: Supervision Gait Distance (Feet): 200 Feet Assistive device: None Gait Pattern/deviations: Step-through pattern;Decreased stride length Gait velocity: WFL Gait velocity interpretation: 1.31 - 2.62 ft/sec, indicative of limited community ambulator   General Gait Details: pt with single episode of R knee buckling needing minG as pt corrected, but otherwise with minG and functional gait speed.        Balance Overall balance assessment: Mild deficits observed, not formally tested                               Standardized Balance Assessment Standardized Balance Assessment : Dynamic Gait Index   Dynamic Gait Index Level Surface: Normal Change in Gait Speed: Normal Gait with Horizontal Head Turns: Normal Gait with Vertical Head Turns: Normal Gait and Pivot Turn: Normal Step Over Obstacle: Mild Impairment Step Around Obstacles: Mild Impairment       Pertinent Vitals/Pain Pain Assessment: No/denies pain    Home Living Family/patient expects to be discharged to:: Private residence Living Arrangements: Children Available Help at Discharge:  Family;Available 24 hours/day Type of Home: House Home Access: Ramped entrance       Home Layout: One level Home Equipment: Conservation officer, nature (2 wheels);Rollator (4 wheels);Cane - single point;Shower seat;Grab bars - tub/shower;Wheelchair - manual      Prior Function Prior  Level of Function : Independent/Modified Independent;Driving             Mobility Comments: pt reports no use of DME, still driving in community and working at [employer] 2 days/week ADLs Comments: independent with everything     Hand Dominance   Dominant Hand: Right    Extremity/Trunk Assessment   Upper Extremity Assessment Upper Extremity Assessment: Overall WFL for tasks assessed    Lower Extremity Assessment Lower Extremity Assessment: Overall WFL for tasks assessed;RLE deficits/detail RLE Deficits / Details: R knee issues chronically. single episode of buckling but pt reports this is due to pain in knee rather than weakness RLE Sensation: WNL RLE Coordination: WNL    Cervical / Trunk Assessment Cervical / Trunk Assessment: Kyphotic  Communication   Communication: HOH  Cognition Arousal/Alertness: Awake/alert Behavior During Therapy: WFL for tasks assessed/performed Overall Cognitive Status: Within Functional Limits for tasks assessed                                 General Comments: mild impulsivity, but able to follow all cues, commands, and demo good safety in session        General Comments General comments (skin integrity, edema, etc.): VSS, pt with single LOB and discused use of cane or RW at home    Exercises     Assessment/Plan    PT Assessment Patient needs continued PT services  PT Problem List Decreased activity tolerance;Decreased balance;Decreased mobility       PT Treatment Interventions DME instruction;Gait training;Stair training;Functional mobility training;Therapeutic activities;Therapeutic exercise;Balance training;Patient/family education    PT Goals (Current goals can be found in the Care Plan section)  Acute Rehab PT Goals Patient Stated Goal: return home PT Goal Formulation: With patient Time For Goal Achievement: 05/12/21 Potential to Achieve Goals: Good    Frequency Min 3X/week    AM-PAC PT "6 Clicks"  Mobility  Outcome Measure Help needed turning from your back to your side while in a flat bed without using bedrails?: None Help needed moving from lying on your back to sitting on the side of a flat bed without using bedrails?: None Help needed moving to and from a bed to a chair (including a wheelchair)?: A Little Help needed standing up from a chair using your arms (e.g., wheelchair or bedside chair)?: A Little Help needed to walk in hospital room?: A Little Help needed climbing 3-5 steps with a railing? : A Little 6 Click Score: 20    End of Session   Activity Tolerance: Patient tolerated treatment well Patient left: in bed;with family/visitor present;with call bell/phone within reach Nurse Communication: Mobility status PT Visit Diagnosis: Other abnormalities of gait and mobility (R26.89);Muscle weakness (generalized) (M62.81)    Time: 6073-7106 PT Time Calculation (min) (ACUTE ONLY): 12 min   Charges:   PT Evaluation $PT Eval Low Complexity: 1 Low          West Carbo, PT, DPT   Acute Rehabilitation Department Pager #: [phone]  Sandra Cockayne 04/28/2021, 12:28 PM

## 2021-04-28 NOTE — Progress Notes (Signed)
Mobility Specialist: Progress Note   04/28/21 1703  Mobility  Activity Ambulated in hall  Level of Assistance Contact guard assist, steadying assist  Assistive Device None  Distance Ambulated (ft) 470 ft  Mobility Ambulated with assistance in hallway  Mobility Response Tolerated well  Mobility performed by Mobility specialist  $Mobility charge 1 Mobility   Post-Mobility: 102 HR  Pt reluctant but agreeable to mobility, no c/o throughout. Pt back to bed after walk with call bell at her side.   [facility] Jery Hollern Mobility Specialist Mobility Specialist [address] [place]: [phone] Mobility Specialist [address] [place] [address] [place]: [phone]

## 2021-04-29 MED ORDER — APIXABAN 5 MG PO TABS: 5.0000 mg | ORAL_TABLET | Freq: Two times a day (BID) | ORAL | 3 refills | Status: DC

## 2021-04-29 NOTE — Plan of Care (Signed)
  Problem: Education: Goal: Knowledge of General Education information will improve Description: Including pain rating scale, medication(s)/side effects and non-pharmacologic comfort measures Outcome: Adequate for Discharge   

## 2021-04-29 NOTE — Progress Notes (Signed)
Discharge instructions (including medications) discussed with and copy provided to patient/caregiver 

## 2021-04-29 NOTE — Discharge Summary (Signed)
Physician Discharge Summary  Tara Ramos ZOX:[id number] DOB: 1931-06-06 DOA: 04/26/2021  PCP: Adin Hector, MD  Admit date: 04/26/2021 Discharge date: 04/29/2021  Admitted From: Home Disposition:  Home  Recommendations for Outpatient Follow-up:  Follow up with PCP in 1-2 weeks Please obtain BMP/CBC in one week Will need a hypercoagulable panel in 3 to 6 months. She will also need a repeat CT scan of the chest in 6 to 12 months to reevaluate a 6 mm nodule in the lung  Home Health:no Equipment/Devices:none  Discharge Condition:Stable CODE STATUS:Full Diet recommendation: Heart Healthy  Brief/Interim Summary: Tara Ramos is an 88 y.o. female past medical history significant for breast cancer status post radical mastectomy with radiation, renal cell carcinoma status post nephrectomy with left kidney ablation, chronic kidney disease stage IIIa, paroxysmal atrial fibrillation not on anticoagulation comes into the hospital for shortness of breath and diagnosed with a massive PE with right heart strain admitted to PCCM status post thrombectomy.  Discharge Diagnoses:  Principal Problem:   Pulmonary embolism ([place])  Acute respiratory failure with hypoxia secondary to acute massive pulmonary embolism with right heart strain: Was hypotensive and tachycardic status post thrombectomy on 04/27/2021. She was started on IV heparin and required oxygen initially now weaned to room air. She has been transition to oral DOAC. Her PCP to repeat hypercoagulable panel as an outpatient.  6 mm nodule: Follow-up with PCP and repeat a CT scan in 6 to 12 months.  History of breast cancer: Noted.  Essential hypertension: No changes made to her medication.  Hyperlipidemia: Excellent continue statins.  Chronic kidney stage IIIa: Her creatinine remained at baseline.  Paroxysmal atrial fibrillation: She was continued on her metoprolol, when she came into the hospital she was not initially on  Eliquis for atrial fibrillation. She was discharged on Eliquis for her PE she did have a transitory 1 day episode of atrial fibrillation for which she converted to sinus rhythm spontaneously.  Discharge Instructions  Discharge Instructions     Diet - low sodium heart healthy   Complete by: As directed    Increase activity slowly   Complete by: As directed       Allergies as of 04/29/2021       Reactions   Morphine And Related Nausea And Vomiting   Lisinopril Cough   Tape Other (See Comments)   Blisters underneath; wide white hypofix tape post kidney biopsy         Medication List     TAKE these medications    amLODipine 10 MG tablet Commonly known as: NORVASC Take 10 mg by mouth daily. What changed: Another medication with the same name was removed. Continue taking this medication, and follow the directions you see here.   Calcium + D3 600-200 MG-UNIT Tabs Take 1 tablet by mouth daily.   docusate sodium 100 MG capsule Commonly known as: COLACE Take 100 mg by mouth at bedtime.   Eliquis DVT/PE Starter Pack Generic drug: Apixaban Starter Pack (10mg  and 5mg ) Take as directed on package: start with two-5mg  tablets twice daily for 7 days. On day 8, switch to one-5mg  tablet twice daily.   apixaban 5 MG Tabs tablet Commonly known as: ELIQUIS Take 1 tablet (5 mg total) by mouth 2 (two) times daily. Start taking on: June 05, 2021   losartan 100 MG tablet Commonly known as: COZAAR Take 100 mg by mouth every morning.   metoprolol tartrate 100 MG tablet Commonly known as: LOPRESSOR Take 100 mg  by mouth 2 (two) times daily.   multivitamin with minerals Tabs tablet Take 1 tablet by mouth daily.   PRESERVISION AREDS 2 PO Take 1 capsule by mouth in the morning and at bedtime.   spironolactone 25 MG tablet Commonly known as: ALDACTONE Take 25 mg by mouth daily.   vitamin C 1000 MG tablet Take 1,000 mg by mouth daily.        Allergies  Allergen Reactions    Morphine And Related Nausea And Vomiting   Lisinopril Cough   Tape Other (See Comments)    Blisters underneath; wide white hypofix tape post kidney biopsy     Consultations: Pulmonary and critical care   Procedures/Studies: DG Chest 2 View  Result Date: 04/26/2021 CLINICAL DATA:  Shortness of breath. EXAM: CHEST - 2 VIEW COMPARISON:  February 04, 2018 FINDINGS: Mild, stable left basilar linear scarring is seen. There is no evidence of acute infiltrate, pleural effusion or pneumothorax. The heart size and mediastinal contours are within normal limits. There is moderate to marked severity calcification of the aortic arch and tortuosity of the descending thoracic aorta. A chronic compression fracture deformity is again seen within the midthoracic spine. IMPRESSION: Stable left basilar linear scarring without acute or active cardiopulmonary disease. Electronically Signed   By: Tara Ramos M.D.   On: 04/26/2021 01:44   CT Angio Chest PE W and/or Wo Contrast  Result Date: 04/26/2021 CLINICAL DATA:  Pulmonary embolism (PE) suspected, high prob. Dyspnea, lower extremity edema. EXAM: CT ANGIOGRAPHY CHEST WITH CONTRAST TECHNIQUE: Multidetector CT imaging of the chest was performed using the standard protocol during bolus administration of intravenous contrast. Multiplanar CT image reconstructions and MIPs were obtained to evaluate the vascular anatomy. CONTRAST:  64mL OMNIPAQUE IOHEXOL 350 MG/ML SOLN COMPARISON:  None. FINDINGS: Cardiovascular: There is adequate opacification the pulmonary arterial tree. There is extensive intraluminal filling defect within the right pulmonary artery extending into all lobar and segmental pulmonary arteries as well as within multiple segmental branches within the left upper and lower lobes in keeping with acute pulmonary embolism. The central pulmonary arteries are enlarged in keeping with pulmonary arterial hypertension. There is enlargement of the right heart with  reversal of the normal RV/LV ratio (2.0). While the right ventricle appears mildly enlarged on prior examination, the degree of relative enlargement appears to of progressed in keeping with an element of acute right heart strain. Moderate coronary artery calcification. Mild global cardiomegaly. No pericardial effusion. Moderate atherosclerotic calcification within the thoracic aorta. No aortic aneurysm. Mediastinum/Nodes: Nodule within the inferior left thyroid lobe has enlarged since prior examination now measuring 19 mm on axial image # 62/6, not well characterized on this examination. No pathologic thoracic adenopathy. Esophagus is unremarkable. Lungs/Pleura: Scattered areas of airway impaction are noted within the lung bases, nonspecific. Mean 6 mm noncalcified pulmonary nodule is seen within the right upper lobe, axial image # 33/7, new since prior examination and indeterminate. The lungs are otherwise clear. No pneumothorax or pleural effusion. Central airways are widely patent. Upper Abdomen: Post believe changes noted within the left posterior pararenal space. No acute abnormality. Musculoskeletal: Remote midthoracic compression fracture noted. No acute bone abnormality. No lytic or blastic bone lesion identified. Review of the MIP images confirms the above findings. IMPRESSION: Positive for acute PE with CTevidence of right heart strain (RV/LV Ratio = 2.0) consistent with at least submassive (intermediate risk) PE. The presence of right heart strain has been associated with an increased risk of morbidity and mortality. Moderate coronary  artery calcification. 19 mm enlarging nodule within the left thyroid lobe inferiorly. Recommend thyroid US (ref: J Am Coll Radiol. 2015 Feb;12(2): 143-50). 6 mm right solid pulmonary nodule within the upper lobe. Recommend a non-contrast Chest CT at 6-12 months. If patient is high risk for malignancy, recommend an additional non-contrast Chest CT at 18-24 months; if patient  is low risk for malignancy a non-contrast Chest CT at 18-24 months is optional. These guidelines do not apply to immunocompromised patients and patients with cancer. Follow up in patients with significant comorbidities as clinically warranted. For lung cancer screening, adhere to Lung-RADS guidelines. Reference: Radiology. 2017; 284(1):228-43. These results were called by telephone at the time of interpretation on 04/26/2021 at 3:43 am to provider [facility] , who verbally acknowledged these results. Electronically Signed   By: Fidela Salisbury M.D.   On: 04/26/2021 03:52   MR KNEE RIGHT WO CONTRAST  Result Date: 04/02/2021 CLINICAL DATA:  Right knee pain with medial swelling x 6 weeks. EXAM: MRI OF THE RIGHT KNEE WITHOUT CONTRAST TECHNIQUE: Multiplanar, multisequence MR imaging of the knee was performed. No intravenous contrast was administered. COMPARISON:  None. FINDINGS: MENISCI Medial meniscus: There is fraying of the posterior root of the medial meniscus with possible nondisplaced degenerative tearing. Intrasubstance degenerative signal within the meniscal body. Lateral meniscus: Intact. LIGAMENTS Cruciates: Intact ACL and PCL. Collaterals: Periligamentous edema along the medial collateral ligament. The lateral collateral ligamentous complex is intact. CARTILAGE Patellofemoral: Mild partial-thickness cartilage loss of the medial and lateral patellar facets. Medial: Moderate partial-thickness cartilage loss with focal 4 x 6 mm chondral defect along the weight-bearing medial femoral condyle. Lateral:  Mild chondrosis.  No focal defect. Joint:  Small joint effusion. Popliteal Fossa:  Tiny Butikofer cyst. Extensor Mechanism: Intact quadriceps tendon and patellar tendon. Bones: There is a nondisplaced subchondral insufficiency fracture along the anteromedial tibial plateau, with extensive associated bony edema. Tricompartmental osteophyte formation. Other: Adjacent soft tissue edema along the medial proximal tibia.  IMPRESSION: 1. Nondisplaced subchondral insufficiency fracture along the anteromedial tibial plateau, with extensive associated bony edema. 2. Tricompartmental osteoarthritis, worst in the medial compartment, with moderate partial-thickness cartilage loss and focal 4 x 6 mm chondral defect along the weight-bearing medial femoral condyle. 3. Fraying of the posterior root of the medial meniscus with possible nondisplaced degenerative tearing. 4. Grade 1 MCL sprain. 5. Small joint effusion.  Tiny Mehl cyst. Electronically Signed   By: Maurine Simmering M.D.   On: 04/02/2021 07:55   US Venous Img Lower Bilateral (DVT)  Result Date: 04/26/2021 CLINICAL DATA:  New diagnosis of acute pulmonary embolus. Lower extremity edema. Shortness of breath for 1 day. EXAM: BILATERAL LOWER EXTREMITY VENOUS DOPPLER ULTRASOUND TECHNIQUE: Gray-scale sonography with graded compression, as well as color Doppler and duplex ultrasound were performed to evaluate the lower extremity deep venous systems from the level of the common femoral vein and including the common femoral, femoral, profunda femoral, popliteal and calf veins including the posterior tibial, peroneal and gastrocnemius veins when visible. The superficial great saphenous vein was also interrogated. Spectral Doppler was utilized to evaluate flow at rest and with distal augmentation maneuvers in the common femoral, femoral and popliteal veins. COMPARISON:  None. FINDINGS: RIGHT LOWER EXTREMITY Common Femoral Vein: No evidence of thrombus. Normal compressibility, respiratory phasicity and response to augmentation. Saphenofemoral Junction: No evidence of thrombus. Normal compressibility and flow on color Doppler imaging. Profunda Femoral Vein: No evidence of thrombus. Normal compressibility and flow on color Doppler imaging. Femoral Vein: No evidence  of thrombus. Normal compressibility, respiratory phasicity and response to augmentation. Popliteal Vein: No evidence of thrombus.  Normal compressibility, respiratory phasicity and response to augmentation. Calf Veins: No evidence of thrombus. Normal compressibility and flow on color Doppler imaging. Superficial Great Saphenous Vein: No evidence of thrombus. Normal compressibility. Venous Reflux:  None. Other Findings:  None. LEFT LOWER EXTREMITY Common Femoral Vein: No evidence of thrombus. Normal compressibility, respiratory phasicity and response to augmentation. Saphenofemoral Junction: No evidence of thrombus. Normal compressibility and flow on color Doppler imaging. Profunda Femoral Vein: No evidence of thrombus. Normal compressibility and flow on color Doppler imaging. Femoral Vein: No evidence of thrombus. Normal compressibility, respiratory phasicity and response to augmentation. Popliteal Vein: Nonocclusive thrombus is identified within the popliteal vein. There is resultant loss of compressibility of the affected segment of vein. Calf Veins: No evidence of thrombus. Normal compressibility and flow on color Doppler imaging. Superficial Great Saphenous Vein: No evidence of thrombus. Normal compressibility. Venous Reflux:  None. Other Findings:  None. IMPRESSION: 1. Examination is positive for nonocclusive DVT in the left popliteal vein. 2. No evidence of DVT in right lower extremity. Electronically Signed   By: Kerby Moors M.D.   On: 04/26/2021 12:32   IR THROMBECT PRIM MECH INIT (INCLU) MOD SED  Result Date: 04/27/2021 INDICATION: Pulmonary embolism, intermediate low risk pulmonary embolism with symptoms including shortness of breath with minimal activity EXAM: 1. Ultrasound-guided puncture of the right common femoral vein 2. Catheterization and venography of the right pulmonary artery 3. Mechanical/aspiration thrombectomy of the right pulmonary artery 4. Pressure measurements of the right pulmonary artery COMPARISON:  CTA chest same day MEDICATIONS: None. ANESTHESIA/SEDATION: Moderate (conscious) sedation was employed during  this procedure. A total of Versed 2 mg and Fentanyl 75 mcg was administered intravenously by the radiology nurse. Total intra-service moderate Sedation Time: 66 minutes. The patient's level of consciousness and vital signs were monitored continuously by radiology nursing throughout the procedure under my direct supervision. FLUOROSCOPY TIME:  FLUOROSCOPY TIME Fluoroscopy Time: 23.1 minutes with 5 exposures COMPLICATIONS: None immediate. TECHNIQUE: Informed written consent was obtained from the patient after a thorough discussion of the procedural risks, benefits and alternatives. All questions were addressed. Maximal Sterile Barrier Technique was utilized including caps, mask, sterile gowns, sterile gloves, sterile drape, hand hygiene and skin antiseptic. A timeout was performed prior to the initiation of the procedure. The patient was placed supine on the exam table. The right groin was prepped and draped in the standard sterile fashion. Ultrasound demonstrated a widely patent right common femoral vein. A permanent image was stored in the electronic medical record. Under ultrasound guidance, the right common femoral vein was directly punctured using a 21 gauge micropuncture set with visualization of needle entry into the vessel lumen. The access site was serially dilated to accommodate a short 6 [place] sheath. Wire was advanced centrally into the IVC, and initial attempts were made to catheterize the pulmonary artery using an angled pigtail catheter. Due to tortuosity of the pulmonary artery bifurcation, this was initially unsuccessful. Eventually, the right pulmonary artery was able to be catheterized using a combination of a long 6 [place] sheath, reinforced angled catheter and Bentson guidewire. Pulmonary artery angiogram of the right pulmonary artery was performed, demonstrating large volume clot in the distal aspect of the main right pulmonary artery. Wire was exchanged for a short tapered Amplatz guidewire,  over which a 24 French sheath was introduced into the access site. While maintaining wire position, an [place] 24 [place] FlowTriever  aspiration thrombectomy catheter was advanced into the more distal right pulmonary artery. Multiple aspiration thrombectomy passes were then performed of the right pulmonary artery. Approximally 5 passes were performed, with blood returned using the FlowSaver syringe. Large amounts of acute and subacute clot was retrieved. Post thrombectomy right pulmonary artery angiogram was then performed through the aspiration catheter, demonstrating near-complete resolution of the proximal clot, with small volume residual subsegmental embolism, not amenable to further aspiration thrombectomy. Pulmonary artery pressures were then transducer, which were estimated at 58/23 (mean 35) mm Hg. Although this was still relatively elevated compared to normal values, the patient demonstrated clinical relief with improved shortness of breath nearly immediately after the thrombectomy, with concordant improvement in the patient's vital signs including improved oxygenation and resolution of tachycardia. Given these improvements and known low volume clot in the left pulmonary artery, the procedure was stopped at this point. All wires and catheters were removed. Hemostasis was achieved at the access site using a pursestring suture. A clean dressing was placed. The patient tolerated the procedure well without immediate complication, and was returned to per original disposition in the critical care unit in stable condition. FINDINGS: As above. IMPRESSION: Successful catheter directed mechanical/aspiration thrombectomy of the right pulmonary artery using the [place] FlowTriever device, with large volume of acute and subacute clot removed. The patient reported near immediate improvement in clinical symptomatology, with concordant improvement in the patient's vital signs including improved oxygenation and resolution of  tachycardia. Electronically Signed   By: Albin Felling M.D.   On: 04/27/2021 10:52   IR US Guide Vasc Access Right  Result Date: 04/27/2021 INDICATION: Pulmonary embolism, intermediate low risk pulmonary embolism with symptoms including shortness of breath with minimal activity EXAM: 1. Ultrasound-guided puncture of the right common femoral vein 2. Catheterization and venography of the right pulmonary artery 3. Mechanical/aspiration thrombectomy of the right pulmonary artery 4. Pressure measurements of the right pulmonary artery COMPARISON:  CTA chest same day MEDICATIONS: None. ANESTHESIA/SEDATION: Moderate (conscious) sedation was employed during this procedure. A total of Versed 2 mg and Fentanyl 75 mcg was administered intravenously by the radiology nurse. Total intra-service moderate Sedation Time: 66 minutes. The patient's level of consciousness and vital signs were monitored continuously by radiology nursing throughout the procedure under my direct supervision. FLUOROSCOPY TIME:  FLUOROSCOPY TIME Fluoroscopy Time: 23.1 minutes with 5 exposures COMPLICATIONS: None immediate. TECHNIQUE: Informed written consent was obtained from the patient after a thorough discussion of the procedural risks, benefits and alternatives. All questions were addressed. Maximal Sterile Barrier Technique was utilized including caps, mask, sterile gowns, sterile gloves, sterile drape, hand hygiene and skin antiseptic. A timeout was performed prior to the initiation of the procedure. The patient was placed supine on the exam table. The right groin was prepped and draped in the standard sterile fashion. Ultrasound demonstrated a widely patent right common femoral vein. A permanent image was stored in the electronic medical record. Under ultrasound guidance, the right common femoral vein was directly punctured using a 21 gauge micropuncture set with visualization of needle entry into the vessel lumen. The access site was serially  dilated to accommodate a short 6 [place] sheath. Wire was advanced centrally into the IVC, and initial attempts were made to catheterize the pulmonary artery using an angled pigtail catheter. Due to tortuosity of the pulmonary artery bifurcation, this was initially unsuccessful. Eventually, the right pulmonary artery was able to be catheterized using a combination of a long 6 [place] sheath, reinforced angled catheter and Bentson guidewire.  Pulmonary artery angiogram of the right pulmonary artery was performed, demonstrating large volume clot in the distal aspect of the main right pulmonary artery. Wire was exchanged for a short tapered Amplatz guidewire, over which a 24 French sheath was introduced into the access site. While maintaining wire position, an [place] 24 [place] FlowTriever aspiration thrombectomy catheter was advanced into the more distal right pulmonary artery. Multiple aspiration thrombectomy passes were then performed of the right pulmonary artery. Approximally 5 passes were performed, with blood returned using the FlowSaver syringe. Large amounts of acute and subacute clot was retrieved. Post thrombectomy right pulmonary artery angiogram was then performed through the aspiration catheter, demonstrating near-complete resolution of the proximal clot, with small volume residual subsegmental embolism, not amenable to further aspiration thrombectomy. Pulmonary artery pressures were then transducer, which were estimated at 58/23 (mean 35) mm Hg. Although this was still relatively elevated compared to normal values, the patient demonstrated clinical relief with improved shortness of breath nearly immediately after the thrombectomy, with concordant improvement in the patient's vital signs including improved oxygenation and resolution of tachycardia. Given these improvements and known low volume clot in the left pulmonary artery, the procedure was stopped at this point. All wires and catheters were removed.  Hemostasis was achieved at the access site using a pursestring suture. A clean dressing was placed. The patient tolerated the procedure well without immediate complication, and was returned to per original disposition in the critical care unit in stable condition. FINDINGS: As above. IMPRESSION: Successful catheter directed mechanical/aspiration thrombectomy of the right pulmonary artery using the [place] FlowTriever device, with large volume of acute and subacute clot removed. The patient reported near immediate improvement in clinical symptomatology, with concordant improvement in the patient's vital signs including improved oxygenation and resolution of tachycardia. Electronically Signed   By: Albin Felling M.D.   On: 04/27/2021 10:52   (Echo, Carotid, EGD, Colonoscopy, ERCP)    Subjective: No complaints  Discharge Exam: Vitals:   04/29/21 0330 04/29/21 0752  BP: 125/84 132/87  Pulse: 62 79  Resp: 13 19  Temp: 97.7 F (36.5 C) 98 F (36.7 C)  SpO2: 97% 99%   Vitals:   04/28/21 2311 04/29/21 0300 04/29/21 0330 04/29/21 0752  BP: 122/85  125/84 132/87  Pulse: 78 66 62 79  Resp: 20 (!) 24 13 19   Temp: 97.8 F (36.6 C)  97.7 F (36.5 C) 98 F (36.7 C)  TempSrc: Oral  Oral Oral  SpO2: 94% 95% 97% 99%  Weight:  76.3 kg    Height:        General: Pt is alert, awake, not in acute distress Cardiovascular: RRR, S1/S2 +, no rubs, no gallops Respiratory: CTA bilaterally, no wheezing, no rhonchi Abdominal: Soft, NT, ND, bowel sounds + Extremities: no edema, no cyanosis    The results of significant diagnostics from this hospitalization (including imaging, microbiology, ancillary and laboratory) are listed below for reference.     Microbiology: Recent Results (from the past 240 hour(s))  Resp Panel by RT-PCR (Flu A&B, Covid) Nasopharyngeal Swab     Status: None   Collection Time: 04/26/21  1:39 AM   Specimen: Nasopharyngeal Swab; Nasopharyngeal(NP) swabs in vial transport medium   Result Value Ref Range Status   SARS Coronavirus 2 by RT PCR NEGATIVE NEGATIVE Final    Comment: (NOTE) SARS-CoV-2 target nucleic acids are NOT DETECTED.  The SARS-CoV-2 RNA is generally detectable in upper respiratory specimens during the acute phase of infection. The lowest concentration of SARS-CoV-2  viral copies this assay can detect is 138 copies/mL. A negative result does not preclude SARS-Cov-2 infection and should not be used as the sole basis for treatment or other patient management decisions. A negative result may occur with  improper specimen collection/handling, submission of specimen other than nasopharyngeal swab, presence of viral mutation(s) within the areas targeted by this assay, and inadequate number of viral copies(<138 copies/mL). A negative result must be combined with clinical observations, patient history, and epidemiological information. The expected result is Negative.  Fact Sheet for Patients:  EntrepreneurPulse.com.au  Fact Sheet for Healthcare Providers:  IncredibleEmployment.be  This test is no t yet approved or cleared by the [place] FDA and  has been authorized for detection and/or diagnosis of SARS-CoV-2 by FDA under an Emergency Use Authorization (EUA). This EUA will remain  in effect (meaning this test can be used) for the duration of the COVID-19 declaration under Section 564(b)(1) of the Act, 21 U.S.C.section 360bbb-3(b)(1), unless the authorization is terminated  or revoked sooner.       Influenza A by PCR NEGATIVE NEGATIVE Final   Influenza B by PCR NEGATIVE NEGATIVE Final    Comment: (NOTE) The Xpert Xpress SARS-CoV-2/FLU/RSV plus assay is intended as an aid in the diagnosis of influenza from Nasopharyngeal swab specimens and should not be used as a sole basis for treatment. Nasal washings and aspirates are unacceptable for Xpert Xpress SARS-CoV-2/FLU/RSV testing.  Fact Sheet for  Patients: EntrepreneurPulse.com.au  Fact Sheet for Healthcare Providers: IncredibleEmployment.be  This test is not yet approved or cleared by the [place] FDA and has been authorized for detection and/or diagnosis of SARS-CoV-2 by FDA under an Emergency Use Authorization (EUA). This EUA will remain in effect (meaning this test can be used) for the duration of the COVID-19 declaration under Section 564(b)(1) of the Act, 21 U.S.C. section 360bbb-3(b)(1), unless the authorization is terminated or revoked.  Performed at [facility], [address] [place], [place] [address]   MRSA Next Gen by PCR, Nasal     Status: None   Collection Time: 04/26/21  3:31 PM   Specimen: Nasal Mucosa; Nasal Swab  Result Value Ref Range Status   MRSA by PCR Next Gen NOT DETECTED NOT DETECTED Final    Comment: (NOTE) The GeneXpert MRSA Assay (FDA approved for NASAL specimens only), is one component of a comprehensive MRSA colonization surveillance program. It is not intended to diagnose MRSA infection nor to guide or monitor treatment for MRSA infections. Test performance is not FDA approved in patients less than 65 years old. Performed at [facility], [place] [address], [place],  [address]      Labs: BNP (last 3 results) Recent Labs    04/26/21 0139  BNP 384.6*   Basic Metabolic Panel: Recent Labs  Lab 04/26/21 0139 04/27/21 0105 04/27/21 0609  NA 140 138 140  K 5.2* 4.4 4.0  CL 108 108  --   CO2 25 22  --   GLUCOSE 170* 111*  --   BUN 22 17  --   CREATININE 1.29* 1.46*  --   CALCIUM 9.9 8.9  --   MG  --  2.0  --   PHOS  --  5.0*  --    Liver Function Tests: No results for input(s): AST, ALT, ALKPHOS, BILITOT, PROT, ALBUMIN in the last 168 hours. No results for input(s): LIPASE, AMYLASE in the last 168 hours. No results for input(s): AMMONIA in the last 168 hours. CBC:  Recent Labs  Lab 04/26/21 0139 04/27/21 0105  04/27/21 0609 04/28/21 0449  WBC 15.9* 13.5*  --  9.8  NEUTROABS 14.1*  --   --   --   HGB 15.9* 13.9 13.3 12.9  HCT 48.7* 42.8 39.0 40.4  MCV 92.4 92.2  --  93.1  PLT 324 268  --  248   Cardiac Enzymes: No results for input(s): CKTOTAL, CKMB, CKMBINDEX, TROPONINI in the last 168 hours. BNP: Invalid input(s): POCBNP CBG: Recent Labs  Lab 04/26/21 1410 04/27/21 0706  GLUCAP 132* 89   D-Dimer No results for input(s): DDIMER in the last 72 hours. Hgb A1c No results for input(s): HGBA1C in the last 72 hours. Lipid Profile No results for input(s): CHOL, HDL, LDLCALC, TRIG, CHOLHDL, LDLDIRECT in the last 72 hours. Thyroid function studies No results for input(s): TSH, T4TOTAL, T3FREE, THYROIDAB in the last 72 hours.  Invalid input(s): FREET3 Anemia work up No results for input(s): VITAMINB12, FOLATE, FERRITIN, TIBC, IRON, RETICCTPCT in the last 72 hours. Urinalysis No results found for: COLORURINE, APPEARANCEUR, [place], [place], GLUCOSEU, [place], [place], [place], PROTEINUR, UROBILINOGEN, NITRITE, LEUKOCYTESUR Sepsis Labs Invalid input(s): PROCALCITONIN,  WBC,  LACTICIDVEN Microbiology Recent Results (from the past 240 hour(s))  Resp Panel by RT-PCR (Flu A&B, Covid) Nasopharyngeal Swab     Status: None   Collection Time: 04/26/21  1:39 AM   Specimen: Nasopharyngeal Swab; Nasopharyngeal(NP) swabs in vial transport medium  Result Value Ref Range Status   SARS Coronavirus 2 by RT PCR NEGATIVE NEGATIVE Final    Comment: (NOTE) SARS-CoV-2 target nucleic acids are NOT DETECTED.  The SARS-CoV-2 RNA is generally detectable in upper respiratory specimens during the acute phase of infection. The lowest concentration of SARS-CoV-2 viral copies this assay can detect is 138 copies/mL. A negative result does not preclude SARS-Cov-2 infection and should not be used as the sole basis for treatment or other patient management decisions. A negative result may occur with  improper  specimen collection/handling, submission of specimen other than nasopharyngeal swab, presence of viral mutation(s) within the areas targeted by this assay, and inadequate number of viral copies(<138 copies/mL). A negative result must be combined with clinical observations, patient history, and epidemiological information. The expected result is Negative.  Fact Sheet for Patients:  EntrepreneurPulse.com.au  Fact Sheet for Healthcare Providers:  IncredibleEmployment.be  This test is no t yet approved or cleared by the [place] FDA and  has been authorized for detection and/or diagnosis of SARS-CoV-2 by FDA under an Emergency Use Authorization (EUA). This EUA will remain  in effect (meaning this test can be used) for the duration of the COVID-19 declaration under Section 564(b)(1) of the Act, 21 U.S.C.section 360bbb-3(b)(1), unless the authorization is terminated  or revoked sooner.       Influenza A by PCR NEGATIVE NEGATIVE Final   Influenza B by PCR NEGATIVE NEGATIVE Final    Comment: (NOTE) The Xpert Xpress SARS-CoV-2/FLU/RSV plus assay is intended as an aid in the diagnosis of influenza from Nasopharyngeal swab specimens and should not be used as a sole basis for treatment. Nasal washings and aspirates are unacceptable for Xpert Xpress SARS-CoV-2/FLU/RSV testing.  Fact Sheet for Patients: EntrepreneurPulse.com.au  Fact Sheet for Healthcare Providers: IncredibleEmployment.be  This test is not yet approved or cleared by the [place] FDA and has been authorized for detection and/or diagnosis of SARS-CoV-2 by FDA under an Emergency Use Authorization (EUA). This EUA will remain in effect (meaning this test can be used) for the duration of  the COVID-19 declaration under Section 564(b)(1) of the Act, 21 U.S.C. section 360bbb-3(b)(1), unless the authorization is terminated or revoked.  Performed at  [facility], [address] [place], [place] [address]   MRSA Next Gen by PCR, Nasal     Status: None   Collection Time: 04/26/21  3:31 PM   Specimen: Nasal Mucosa; Nasal Swab  Result Value Ref Range Status   MRSA by PCR Next Gen NOT DETECTED NOT DETECTED Final    Comment: (NOTE) The GeneXpert MRSA Assay (FDA approved for NASAL specimens only), is one component of a comprehensive MRSA colonization surveillance program. It is not intended to diagnose MRSA infection nor to guide or monitor treatment for MRSA infections. Test performance is not FDA approved in patients less than 41 years old. Performed at [facility], [place] [address], [place], [place] [address]       SIGNED:   Charlynne Cousins, MD  [facility] 04/29/2021, 8:52 AM Pager   If 7PM-7AM, please contact night-coverage [email or web address] Password [email or web address]

## 2021-04-29 NOTE — TOC Benefit Eligibility Note (Signed)
Transition of Care [facility]) Benefit Eligibility Note    Patient Details  Name: DANEE SOLLER MRN: [id number] Date of Birth: [birth year]   Medication/Dose: ELIQUIS  2.5 MG   CO-PAY $45.00  and   ELIQUIS   5 MG BID   CO-PAY- $ 45.00           ELIQUIS  10 MG BID : NON-FORMULARY  Covered?: Yes  Tier: 3 Drug  Prescription Coverage Preferred Pharmacy: [place] with Person/Company/Phone Number:: VINCE  @ [employer]  #  [phone]  Co-Pay: $45.00  Prior Approval: No  Deductible: Unmet (OUT-OF-POCKET :UNMET)  Additional Notes: XARELTO 15 MG BID  COVER- YES,  CO-PAY- $45.00  , TIERT- 3 DRUG ,  P/A-NO    Memory Argue Phone Number: 04/29/2021, 11:51 AM

## 2021-05-05 ENCOUNTER — Telehealth: Payer: Self-pay

## 2021-05-05 DIAGNOSIS — E7849 Other hyperlipidemia: Secondary | ICD-10-CM | POA: Diagnosis not present

## 2021-05-05 DIAGNOSIS — I10 Essential (primary) hypertension: Secondary | ICD-10-CM | POA: Diagnosis not present

## 2021-05-05 DIAGNOSIS — N1832 Chronic kidney disease, stage 3b: Secondary | ICD-10-CM | POA: Diagnosis not present

## 2021-05-05 DIAGNOSIS — K219 Gastro-esophageal reflux disease without esophagitis: Secondary | ICD-10-CM | POA: Diagnosis not present

## 2021-05-05 NOTE — Telephone Encounter (Signed)
Incoming call from pt on triage line who states she was recently hospitalized and started on Eliquis. She states she is hesitant to begin Eliquis and she read it could interact with kidney function and only has one kidney, she would like the ok from Urology to begin the medication. Please advise.

## 2021-05-06 NOTE — Telephone Encounter (Signed)
She had a PE, she needs to take the Eliquis as prescribed.   The PE is life threatening.      Called pt advised her of the information above per Zara Council, PA. Pt voiced understanding.

## 2021-05-08 ENCOUNTER — Telehealth (HOSPITAL_COMMUNITY): Payer: Self-pay | Admitting: Pharmacy Technician

## 2021-05-08 ENCOUNTER — Other Ambulatory Visit (HOSPITAL_COMMUNITY): Payer: Self-pay

## 2021-05-08 NOTE — Telephone Encounter (Signed)
Pharmacy Transitions of Care Follow-up Telephone Call  Date of discharge: 04/29/2021  Discharge Diagnosis: PE   Medication changes made at discharge: START taking: Eliquis DVT/PE Starter Pack (Apixaban Starter Pack (10mg  and 5mg ))  apixaban [place]ne [place])  Start taking on: June 05, 2021 CHANGE how you take: amLODipine (NORVASC)   Medication changes verified by the patient?  Yes    Medication Accessibility:  Home Pharmacy: [facility], [place]   Was the patient provided with refills on discharged medications? No   Have all prescriptions been transferred from [facility] to home pharmacy? N/A   Is the patient able to afford medications? Yes Notable copays: Eliquis $45/30 days    Medication Review:  APIXABAN (ELIQUIS)  Apixaban 10 mg BID initiated on 01/0/2023. Will switch to apixaban 5 mg BID after 7 days (05/05/2021).  - Discussed importance of taking medication around the same time everyday  - Reviewed potential DDIs with patient  - Advised patient of medications to avoid (NSAIDs, ASA)  - Educated that Tylenol (acetaminophen) will be the preferred analgesic to prevent risk of bleeding  - Emphasized importance of monitoring for signs and symptoms of bleeding (abnormal bruising, prolonged bleeding, nose bleeds, bleeding from gums, discolored urine, black tarry stools)  - Advised patient to alert all providers of anticoagulation therapy prior to starting a new medication or having a procedure    Follow-up Appointments:  PCP Hospital f/u appt confirmed?  Scheduled to see [facility] on 05/12/2021.    If their condition worsens, is the pt aware to call PCP or go to the Emergency Dept.? Yes  Final Patient Assessment:  -Pt is doing well. Denies CP/SOB/palpitations. Denies missed doses. Denies s/sx of bleeding.  -Pt verbalized understanding of Eliquis.  -Pt has previously scheduled appointment with PCP and will f/u as recommended.  Parthenia Ames,  PharmD

## 2021-05-12 DIAGNOSIS — Z85528 Personal history of other malignant neoplasm of kidney: Secondary | ICD-10-CM | POA: Diagnosis not present

## 2021-05-12 DIAGNOSIS — I2510 Atherosclerotic heart disease of native coronary artery without angina pectoris: Secondary | ICD-10-CM | POA: Diagnosis not present

## 2021-05-12 DIAGNOSIS — M810 Age-related osteoporosis without current pathological fracture: Secondary | ICD-10-CM | POA: Diagnosis not present

## 2021-05-12 DIAGNOSIS — Z853 Personal history of malignant neoplasm of breast: Secondary | ICD-10-CM | POA: Diagnosis not present

## 2021-05-12 DIAGNOSIS — Z0000 Encounter for general adult medical examination without abnormal findings: Secondary | ICD-10-CM | POA: Diagnosis not present

## 2021-05-12 DIAGNOSIS — I700 Atherosclerosis of aorta: Secondary | ICD-10-CM | POA: Diagnosis not present

## 2021-05-12 DIAGNOSIS — K219 Gastro-esophageal reflux disease without esophagitis: Secondary | ICD-10-CM | POA: Diagnosis not present

## 2021-05-12 DIAGNOSIS — E7849 Other hyperlipidemia: Secondary | ICD-10-CM | POA: Diagnosis not present

## 2021-05-12 DIAGNOSIS — I2609 Other pulmonary embolism with acute cor pulmonale: Secondary | ICD-10-CM | POA: Diagnosis not present

## 2021-05-12 DIAGNOSIS — M791 Myalgia, unspecified site: Secondary | ICD-10-CM | POA: Diagnosis not present

## 2021-05-12 DIAGNOSIS — N1832 Chronic kidney disease, stage 3b: Secondary | ICD-10-CM | POA: Diagnosis not present

## 2021-05-12 DIAGNOSIS — I10 Essential (primary) hypertension: Secondary | ICD-10-CM | POA: Diagnosis not present

## 2021-05-12 DIAGNOSIS — I480 Paroxysmal atrial fibrillation: Secondary | ICD-10-CM | POA: Diagnosis not present

## 2021-05-15 ENCOUNTER — Encounter: Payer: Self-pay | Admitting: *Deleted

## 2021-05-23 ENCOUNTER — Inpatient Hospital Stay: Payer: PPO

## 2021-05-23 ENCOUNTER — Inpatient Hospital Stay: Payer: PPO | Attending: Oncology | Admitting: Oncology

## 2021-05-23 ENCOUNTER — Other Ambulatory Visit: Payer: Self-pay

## 2021-05-23 ENCOUNTER — Encounter: Payer: Self-pay | Admitting: Oncology

## 2021-05-23 VITALS — BP 150/91 | HR 65 | Temp 97.8°F | Resp 20 | Wt 169.2 lb

## 2021-05-23 DIAGNOSIS — N1831 Chronic kidney disease, stage 3a: Secondary | ICD-10-CM | POA: Diagnosis not present

## 2021-05-23 DIAGNOSIS — Z7901 Long term (current) use of anticoagulants: Secondary | ICD-10-CM | POA: Insufficient documentation

## 2021-05-23 DIAGNOSIS — I2609 Other pulmonary embolism with acute cor pulmonale: Secondary | ICD-10-CM

## 2021-05-23 DIAGNOSIS — Z85528 Personal history of other malignant neoplasm of kidney: Secondary | ICD-10-CM | POA: Diagnosis not present

## 2021-05-23 DIAGNOSIS — R778 Other specified abnormalities of plasma proteins: Secondary | ICD-10-CM

## 2021-05-23 DIAGNOSIS — Z853 Personal history of malignant neoplasm of breast: Secondary | ICD-10-CM | POA: Diagnosis not present

## 2021-05-23 DIAGNOSIS — C641 Malignant neoplasm of right kidney, except renal pelvis: Secondary | ICD-10-CM

## 2021-05-23 LAB — COMPREHENSIVE METABOLIC PANEL
ALT: 30 U/L (ref 0–44)
AST: 22 U/L (ref 15–41)
Albumin: 3.9 g/dL (ref 3.5–5.0)
Alkaline Phosphatase: 59 U/L (ref 38–126)
Anion gap: 8 (ref 5–15)
BUN: 28 mg/dL — ABNORMAL HIGH (ref 8–23)
CO2: 28 mmol/L (ref 22–32)
Calcium: 9.5 mg/dL (ref 8.9–10.3)
Chloride: 102 mmol/L (ref 98–111)
Creatinine, Ser: 1.28 mg/dL — ABNORMAL HIGH (ref 0.44–1.00)
GFR, Estimated: 41 mL/min — ABNORMAL LOW (ref >60)
Glucose, Bld: 88 mg/dL (ref 70–99)
Potassium: 4.3 mmol/L (ref 3.5–5.1)
Sodium: 138 mmol/L (ref 135–145)
Total Bilirubin: 0.8 mg/dL (ref 0.3–1.2)
Total Protein: 6.6 g/dL (ref 6.5–8.1)

## 2021-05-23 LAB — CBC WITH DIFFERENTIAL/PLATELET
Abs Immature Granulocytes: 0.03 10*3/uL (ref 0.00–0.07)
Basophils Absolute: 0.1 10*3/uL (ref 0.0–0.1)
Basophils Relative: 1 %
Eosinophils Absolute: 0.2 10*3/uL (ref 0.0–0.5)
Eosinophils Relative: 2 %
HCT: 49.2 % — ABNORMAL HIGH (ref 36.0–46.0)
Hemoglobin: 15.7 g/dL — ABNORMAL HIGH (ref 12.0–15.0)
Immature Granulocytes: 0 %
Lymphocytes Relative: 9 %
Lymphs Abs: 0.7 10*3/uL (ref 0.7–4.0)
MCH: 30.2 pg (ref 26.0–34.0)
MCHC: 31.9 g/dL (ref 30.0–36.0)
MCV: 94.6 fL (ref 80.0–100.0)
Monocytes Absolute: 0.5 10*3/uL (ref 0.1–1.0)
Monocytes Relative: 6 %
Neutro Abs: 6.2 10*3/uL (ref 1.7–7.7)
Neutrophils Relative %: 82 %
Platelets: 273 10*3/uL (ref 150–400)
RBC: 5.2 MIL/uL — ABNORMAL HIGH (ref 3.87–5.11)
RDW: 14.3 % (ref 11.5–15.5)
WBC: 7.7 10*3/uL (ref 4.0–10.5)
nRBC: 0 % (ref 0.0–0.2)

## 2021-05-23 NOTE — Progress Notes (Signed)
Hematology/Oncology Consult note [facility] Telephone:([phone] [phone]  Patient Care Team: Pcp, No as PCP - General Clent Jacks, RN as Registered Nurse Hollice Espy, MD as Consulting Physician (Urology) Theodore Demark, RN as Oncology Nurse Navigator Sindy Guadeloupe, MD as Consulting Physician (Hematology and Oncology) Adin Hector, MD as Attending Physician (Family Medicine) Bary Castilla Forest Gleason, MD as Consulting Physician (General Surgery)   Name of the patient: Tara Ramos  [id number]  1931/09/10    Reason for referral-unprovoked pulmonary embolism   Referring physician-Dr. Ramonita Lab  Date of visit: 05/23/21   History of presenting illness- Patient is a 88 year old female with a past medical history significant for breast cancer in 1990s status postlumpectomy and radiation.  She had recurrent disease in 1992 and 2014 as well  in 2021.  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  She is s/p left simple mastectomy in September 2021 and modified radical mastectomy of the right breast at the same time. 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  She presented to the ER on 04/26/2021 with symptoms of worsening shortness of breath and had a CT angio chest which showed acute pulmonary embolism with right heart strain.  Enlarging 19 mm left thyroid nodule and 6 mm right solid pulmonary nodule.  Patient underwent thrombectomy with vascular surgery and was placed on Eliquis.  She has baseline CKD with a creatinine of around 1.4.  She is not currently on any home oxygen.  ECOG PS- 1  Pain scale- 0   Review of systems- Review of Systems  Constitutional:  Positive for malaise/fatigue. Negative for chills, fever and weight loss.  HENT:  Negative for congestion, ear discharge and nosebleeds.   Eyes:  Negative for blurred vision.  Respiratory:  Negative for  cough, hemoptysis, sputum production, shortness of breath and wheezing.   Cardiovascular:  Negative for chest pain, palpitations, orthopnea and claudication.  Gastrointestinal:  Negative for abdominal pain, blood in stool, constipation, diarrhea, heartburn, melena, nausea and vomiting.  Genitourinary:  Negative for dysuria, flank pain, frequency, hematuria and urgency.  Musculoskeletal:  Negative for back pain, joint pain and myalgias.  Skin:  Negative for rash.  Neurological:  Negative for dizziness, tingling, focal weakness, seizures, weakness and headaches.  Endo/Heme/Allergies:  Does not bruise/bleed easily.  Psychiatric/Behavioral:  Negative for depression and suicidal ideas. The patient does not have insomnia.    Allergies  Allergen Reactions   Morphine And Related Nausea And Vomiting   Lisinopril Cough   Tape Other (See Comments)    Blisters underneath; wide white hypofix tape post kidney biopsy     Patient Active Problem List   Diagnosis Date Noted   Acute pulmonary embolism ([place]) 04/26/2021   PAF (paroxysmal atrial fibrillation) ([place]) 04/26/2021   Acute respiratory failure with hypoxia ([place]) 04/26/2021   Leukocytosis 04/26/2021   Hyperkalemia 04/26/2021   Thyroid nodule 04/26/2021   Lung nodule 04/26/2021   CAD (coronary artery disease) 04/26/2021   Pulmonary embolism ([place]) 04/26/2021   History of renal cell cancer 02/15/2020   Myalgia 09/11/2019   Goals of care, counseling/discussion 08/09/2017   Coronary artery disease involving native coronary artery of native heart without angina pectoris 02/15/2017   Osteoporosis without current pathological fracture 09/03/2016   Age-related osteoporosis without current pathological fracture 02/16/2016   Aortic atherosclerosis ([place]) 01/16/2016   Pleural effusion on left 08/07/2015   History of  breast cancer 08/07/2015   Malignant neoplasm of female breast ([place]) 07/15/2015   Cancer of left kidney ([place]) 05/24/2015   Left renal mass     Preop cardiovascular exam 05/06/2015   CKD (chronic kidney disease), stage IIIa 04/15/2015   Collapsed vertebra, not elsewhere classified, thoracic region, initial encounter for fracture ([place]) 04/14/2015   History of compression fracture of spine 04/14/2015   Intraductal carcinoma of breast 02/19/2015   Renal cell carcinoma ([place]) 02/19/2015   Postoperative atrial fibrillation ([place]) 02/02/2015   Renal mass 02/01/2015   Right renal mass 10/31/2014   Carcinoma in situ, breast, ductal 09/28/2014   GERD (gastroesophageal reflux disease) 09/28/2014   HLD (hyperlipidemia) 09/28/2014   Hypertension 09/28/2014   Osteopenia 09/28/2014     Past Medical History:  Diagnosis Date   Anticoagulant long-term use    Arthritis    Breast cancer ([place]) 1992   right breast/radiation   Breast cancer ([place]) 1990   left breast/radiation   Breast cancer ([place]) 2014   right breast   Breast cancer, right breast ([place]) 12/16/2012   left, then right, then right breast cancers (lumpectomies and radiation therapy)   Chronic kidney disease    Dysrhythmia    HX A FIB - FOLLOWED BY [name]   GERD (gastroesophageal reflux disease)    Heart murmur    hx of years ago    Hemorrhoids    Hypertension    Personal history of radiation therapy    1990/1992   Pulmonary emboli ([facility])    Renal cell cancer, right ([place]) 11/2014   Right Nephrectomy and Left renal ablation for lesion.    Renal insufficiency    Status post radiation therapy    breast cancer bilateral     Past Surgical History:  Procedure Laterality Date   BREAST BIOPSY Right 03/10/2016   -  STROMAL SCLEROSIS, GIANT CELL REACTION, CHRONIC INFLAMMATION AND FAT    BREAST BIOPSY Right 12/12/2019   [place] bx of mass, vision marker, path pending   BREAST BIOPSY Right 12/12/2019   [place] bx of LN, hydromarker, path pending   BREAST BIOPSY Left 12/12/2019   stereo bx of calcs, coil marker, path pending   BREAST EXCISIONAL BIOPSY Right 2014   +   BREAST  EXCISIONAL BIOPSY Right 1992   +   BREAST EXCISIONAL BIOPSY Left 1990   +   BREAST LUMPECTOMY Left 1990   BREAST LUMPECTOMY Right 1992   BROW LIFT Bilateral 11/10/2019   Procedure: BLEPHAROPLASTY UPPER EYELID; W/EXCESS SKIN BILATERAL & ECTROPION REPAIR, EXTENSIVE LEFT LOWER LID;  Surgeon: Karle Starch, MD;  Location: [facility];  Service: Ophthalmology;  Laterality: Bilateral;   DILATION AND CURETTAGE OF UTERUS     EYE SURGERY Bilateral    cataract extraction   IR THROMBECT PRIM MECH INIT (INCLU) MOD SED  04/26/2021   IR US GUIDE VASC ACCESS RIGHT  04/26/2021   KYPHOPLASTY N/A 10/09/2014   Procedure: KYPHOPLASTY;  Surgeon: Hessie Knows, MD;  Location: [facility];  Service: Orthopedics;  Laterality: N/A;  T7 Kyphoplasty with bone biopsy   MASTECTOMY MODIFIED RADICAL Right 01/08/2020   Procedure: MASTECTOMY MODIFIED RADICAL;  Surgeon: Robert Bellow, MD;  Location: [facility];  Service: General;  Laterality: Right;   OTHER SURGICAL HISTORY     radiation therapy breast   ROBOT ASSISTED LAPAROSCOPIC NEPHRECTOMY Right 02/01/2015   Procedure: ROBOTIC ASSISTED LAPAROSCOPIC RIGHT NEPHRECTOMY;  Surgeon: Cleon Gustin, MD;  Location: [facility];  Service: Urology;  Laterality:  Right;   SIMPLE MASTECTOMY WITH AXILLARY SENTINEL NODE BIOPSY Left 01/08/2020   Procedure: SIMPLE MASTECTOMY;  Surgeon: Robert Bellow, MD;  Location: [facility];  Service: General;  Laterality: Left;    Social History   Socioeconomic History   Marital status: Married    Spouse name: Not on file   Number of children: Not on file   Years of education: Not on file   Highest education level: Not on file  Occupational History   Not on file  Tobacco Use   Smoking status: Former    Years: 20.00    Types: Cigarettes    Quit date: 04/27/1972    Years since quitting: 49.1   Smokeless tobacco: Never  Vaping Use   Vaping Use: Never used  Substance and Sexual Activity   Alcohol use: Yes    Alcohol/week: 1.0  standard drink    Types: 1 Glasses of wine per week    Comment: 1 glass of wine with dinner occ   Drug use: No   Sexual activity: Never  Other Topics Concern   Not on file  Social History Narrative   Not on file   Social Determinants of Health   Financial Resource Strain: Not on file  Food Insecurity: Not on file  Transportation Needs: Not on file  Physical Activity: Not on file  Stress: Not on file  Social Connections: Not on file  Intimate Partner Violence: Not on file     Family History  Problem Relation Age of Onset   Stomach cancer Mother    Stroke Father    Brain cancer Sister    Breast cancer Daughter 71   Bladder Cancer Neg Hx    Prostate cancer Neg Hx    Kidney cancer Neg Hx      Current Outpatient Medications:    amLODipine (NORVASC) 10 MG tablet, Take 10 mg by mouth daily., Disp: , Rfl:    [START ON 06/05/2021] apixaban (ELIQUIS) 5 MG TABS tablet, Take 1 tablet (5 mg total) by mouth 2 (two) times daily., Disp: 60 tablet, Rfl: 3   APIXABAN (ELIQUIS) VTE STARTER PACK (10MG  AND 5MG ), Take as directed on package: start with two-5mg  tablets twice daily for 7 days. On day 8, switch to one-5mg  tablet twice daily., Disp: 74 tablet, Rfl: 0   Ascorbic Acid (VITAMIN C) 1000 MG tablet, Take 1,000 mg by mouth daily. , Disp: , Rfl:    Calcium Carb-Cholecalciferol (CALCIUM + D3) 600-200 MG-UNIT TABS, Take 1 tablet by mouth daily. , Disp: , Rfl:    docusate sodium (COLACE) 100 MG capsule, Take 100 mg by mouth at bedtime., Disp: , Rfl:    losartan (COZAAR) 100 MG tablet, Take 100 mg by mouth every morning. , Disp: , Rfl:    metoprolol (LOPRESSOR) 100 MG tablet, Take 100 mg by mouth 2 (two) times daily., Disp: , Rfl:    Multiple Vitamin (MULTIVITAMIN WITH MINERALS) TABS tablet, Take 1 tablet by mouth daily., Disp: , Rfl:    Multiple Vitamins-Minerals (PRESERVISION AREDS 2 PO), Take 1 capsule by mouth in the morning and at bedtime., Disp: , Rfl:    spironolactone (ALDACTONE) 25 MG  tablet, Take 25 mg by mouth daily., Disp: , Rfl:    Physical exam:  Vitals:   05/23/21 1033  BP: (!) 150/91  Pulse: 65  Resp: 20  Temp: 97.8 F (36.6 C)  SpO2: 100%  Weight: 169 lb 3.2 oz (76.7 kg)   Physical Exam Constitutional:  General: She is not in acute distress. Cardiovascular:     Rate and Rhythm: Normal rate and regular rhythm.     Heart sounds: Normal heart sounds.  Pulmonary:     Effort: Pulmonary effort is normal.     Breath sounds: Normal breath sounds.  Abdominal:     General: Bowel sounds are normal.     Palpations: Abdomen is soft.  Lymphadenopathy:     Comments: No palpable cervical, supraclavicular, axillary or inguinal adenopathy    Skin:    General: Skin is warm and dry.  Neurological:     Mental Status: She is alert and oriented to person, place, and time.       CMP Latest Ref Rng & Units 05/23/2021  Glucose 70 - 99 mg/dL 88  BUN 8 - 23 mg/dL 28(H)  Creatinine 0.44 - 1.00 mg/dL 1.28(H)  Sodium 135 - 145 mmol/L 138  Potassium 3.5 - 5.1 mmol/L 4.3  Chloride 98 - 111 mmol/L 102  CO2 22 - 32 mmol/L 28  Calcium 8.9 - 10.3 mg/dL 9.5  Total Protein 6.5 - 8.1 g/dL 6.6  Total Bilirubin 0.3 - 1.2 mg/dL 0.8  Alkaline Phos 38 - 126 U/L 59  AST 15 - 41 U/L 22  ALT 0 - 44 U/L 30   CBC Latest Ref Rng & Units 05/23/2021  WBC 4.0 - 10.5 K/uL 7.7  Hemoglobin 12.0 - 15.0 g/dL 15.7(H)  Hematocrit 36.0 - 46.0 % 49.2(H)  Platelets 150 - 400 K/uL 273    No images are attached to the encounter.  DG Chest 2 View  Result Date: 04/26/2021 CLINICAL DATA:  Shortness of breath. EXAM: CHEST - 2 VIEW COMPARISON:  February 04, 2018 FINDINGS: Mild, stable left basilar linear scarring is seen. There is no evidence of acute infiltrate, pleural effusion or pneumothorax. The heart size and mediastinal contours are within normal limits. There is moderate to marked severity calcification of the aortic arch and tortuosity of the descending thoracic aorta. A chronic  compression fracture deformity is again seen within the midthoracic spine. IMPRESSION: Stable left basilar linear scarring without acute or active cardiopulmonary disease. Electronically Signed   By: Virgina Norfolk M.D.   On: 04/26/2021 01:44   CT Angio Chest PE W and/or Wo Contrast  Result Date: 04/26/2021 CLINICAL DATA:  Pulmonary embolism (PE) suspected, high prob. Dyspnea, lower extremity edema. EXAM: CT ANGIOGRAPHY CHEST WITH CONTRAST TECHNIQUE: Multidetector CT imaging of the chest was performed using the standard protocol during bolus administration of intravenous contrast. Multiplanar CT image reconstructions and MIPs were obtained to evaluate the vascular anatomy. CONTRAST:  12mL OMNIPAQUE IOHEXOL 350 MG/ML SOLN COMPARISON:  None. FINDINGS: Cardiovascular: There is adequate opacification the pulmonary arterial tree. There is extensive intraluminal filling defect within the right pulmonary artery extending into all lobar and segmental pulmonary arteries as well as within multiple segmental branches within the left upper and lower lobes in keeping with acute pulmonary embolism. The central pulmonary arteries are enlarged in keeping with pulmonary arterial hypertension. There is enlargement of the right heart with reversal of the normal RV/LV ratio (2.0). While the right ventricle appears mildly enlarged on prior examination, the degree of relative enlargement appears to of progressed in keeping with an element of acute right heart strain. Moderate coronary artery calcification. Mild global cardiomegaly. No pericardial effusion. Moderate atherosclerotic calcification within the thoracic aorta. No aortic aneurysm. Mediastinum/Nodes: Nodule within the inferior left thyroid lobe has enlarged since prior examination now measuring 19 mm on axial  image # 62/6, not well characterized on this examination. No pathologic thoracic adenopathy. Esophagus is unremarkable. Lungs/Pleura: Scattered areas of airway  impaction are noted within the lung bases, nonspecific. Mean 6 mm noncalcified pulmonary nodule is seen within the right upper lobe, axial image # 33/7, new since prior examination and indeterminate. The lungs are otherwise clear. No pneumothorax or pleural effusion. Central airways are widely patent. Upper Abdomen: Post believe changes noted within the left posterior pararenal space. No acute abnormality. Musculoskeletal: Remote midthoracic compression fracture noted. No acute bone abnormality. No lytic or blastic bone lesion identified. Review of the MIP images confirms the above findings. IMPRESSION: Positive for acute PE with CTevidence of right heart strain (RV/LV Ratio = 2.0) consistent with at least submassive (intermediate risk) PE. The presence of right heart strain has been associated with an increased risk of morbidity and mortality. Moderate coronary artery calcification. 19 mm enlarging nodule within the left thyroid lobe inferiorly. Recommend thyroid US (ref: J Am Coll Radiol. 2015 Feb;12(2): 143-50). 6 mm right solid pulmonary nodule within the upper lobe. Recommend a non-contrast Chest CT at 6-12 months. If patient is high risk for malignancy, recommend an additional non-contrast Chest CT at 18-24 months; if patient is low risk for malignancy a non-contrast Chest CT at 18-24 months is optional. These guidelines do not apply to immunocompromised patients and patients with cancer. Follow up in patients with significant comorbidities as clinically warranted. For lung cancer screening, adhere to Lung-RADS guidelines. Reference: Radiology. 2017; 284(1):228-43. These results were called by telephone at the time of interpretation on 04/26/2021 at 3:43 am to provider [facility] , who verbally acknowledged these results. Electronically Signed   By: Fidela Salisbury M.D.   On: 04/26/2021 03:52   US Venous Img Lower Bilateral (DVT)  Result Date: 04/26/2021 CLINICAL DATA:  New diagnosis of acute pulmonary  embolus. Lower extremity edema. Shortness of breath for 1 day. EXAM: BILATERAL LOWER EXTREMITY VENOUS DOPPLER ULTRASOUND TECHNIQUE: Gray-scale sonography with graded compression, as well as color Doppler and duplex ultrasound were performed to evaluate the lower extremity deep venous systems from the level of the common femoral vein and including the common femoral, femoral, profunda femoral, popliteal and calf veins including the posterior tibial, peroneal and gastrocnemius veins when visible. The superficial great saphenous vein was also interrogated. Spectral Doppler was utilized to evaluate flow at rest and with distal augmentation maneuvers in the common femoral, femoral and popliteal veins. COMPARISON:  None. FINDINGS: RIGHT LOWER EXTREMITY Common Femoral Vein: No evidence of thrombus. Normal compressibility, respiratory phasicity and response to augmentation. Saphenofemoral Junction: No evidence of thrombus. Normal compressibility and flow on color Doppler imaging. Profunda Femoral Vein: No evidence of thrombus. Normal compressibility and flow on color Doppler imaging. Femoral Vein: No evidence of thrombus. Normal compressibility, respiratory phasicity and response to augmentation. Popliteal Vein: No evidence of thrombus. Normal compressibility, respiratory phasicity and response to augmentation. Calf Veins: No evidence of thrombus. Normal compressibility and flow on color Doppler imaging. Superficial Great Saphenous Vein: No evidence of thrombus. Normal compressibility. Venous Reflux:  None. Other Findings:  None. LEFT LOWER EXTREMITY Common Femoral Vein: No evidence of thrombus. Normal compressibility, respiratory phasicity and response to augmentation. Saphenofemoral Junction: No evidence of thrombus. Normal compressibility and flow on color Doppler imaging. Profunda Femoral Vein: No evidence of thrombus. Normal compressibility and flow on color Doppler imaging. Femoral Vein: No evidence of thrombus.  Normal compressibility, respiratory phasicity and response to augmentation. Popliteal Vein: Nonocclusive thrombus is identified within  the popliteal vein. There is resultant loss of compressibility of the affected segment of vein. Calf Veins: No evidence of thrombus. Normal compressibility and flow on color Doppler imaging. Superficial Great Saphenous Vein: No evidence of thrombus. Normal compressibility. Venous Reflux:  None. Other Findings:  None. IMPRESSION: 1. Examination is positive for nonocclusive DVT in the left popliteal vein. 2. No evidence of DVT in right lower extremity. Electronically Signed   By: Kerby Moors M.D.   On: 04/26/2021 12:32   IR THROMBECT PRIM MECH INIT (INCLU) MOD SED  Result Date: 04/27/2021 INDICATION: Pulmonary embolism, intermediate low risk pulmonary embolism with symptoms including shortness of breath with minimal activity EXAM: 1. Ultrasound-guided puncture of the right common femoral vein 2. Catheterization and venography of the right pulmonary artery 3. Mechanical/aspiration thrombectomy of the right pulmonary artery 4. Pressure measurements of the right pulmonary artery COMPARISON:  CTA chest same day MEDICATIONS: None. ANESTHESIA/SEDATION: Moderate (conscious) sedation was employed during this procedure. A total of Versed 2 mg and Fentanyl 75 mcg was administered intravenously by the radiology nurse. Total intra-service moderate Sedation Time: 66 minutes. The patient's level of consciousness and vital signs were monitored continuously by radiology nursing throughout the procedure under my direct supervision. FLUOROSCOPY TIME:  FLUOROSCOPY TIME Fluoroscopy Time: 23.1 minutes with 5 exposures COMPLICATIONS: None immediate. TECHNIQUE: Informed written consent was obtained from the patient after a thorough discussion of the procedural risks, benefits and alternatives. All questions were addressed. Maximal Sterile Barrier Technique was utilized including caps, mask, sterile  gowns, sterile gloves, sterile drape, hand hygiene and skin antiseptic. A timeout was performed prior to the initiation of the procedure. The patient was placed supine on the exam table. The right groin was prepped and draped in the standard sterile fashion. Ultrasound demonstrated a widely patent right common femoral vein. A permanent image was stored in the electronic medical record. Under ultrasound guidance, the right common femoral vein was directly punctured using a 21 gauge micropuncture set with visualization of needle entry into the vessel lumen. The access site was serially dilated to accommodate a short 6 [place] sheath. Wire was advanced centrally into the IVC, and initial attempts were made to catheterize the pulmonary artery using an angled pigtail catheter. Due to tortuosity of the pulmonary artery bifurcation, this was initially unsuccessful. Eventually, the right pulmonary artery was able to be catheterized using a combination of a long 6 [place] sheath, reinforced angled catheter and Bentson guidewire. Pulmonary artery angiogram of the right pulmonary artery was performed, demonstrating large volume clot in the distal aspect of the main right pulmonary artery. Wire was exchanged for a short tapered Amplatz guidewire, over which a 24 French sheath was introduced into the access site. While maintaining wire position, an [place] 24 [place] FlowTriever aspiration thrombectomy catheter was advanced into the more distal right pulmonary artery. Multiple aspiration thrombectomy passes were then performed of the right pulmonary artery. Approximally 5 passes were performed, with blood returned using the FlowSaver syringe. Large amounts of acute and subacute clot was retrieved. Post thrombectomy right pulmonary artery angiogram was then performed through the aspiration catheter, demonstrating near-complete resolution of the proximal clot, with small volume residual subsegmental embolism, not amenable to further  aspiration thrombectomy. Pulmonary artery pressures were then transducer, which were estimated at 58/23 (mean 35) mm Hg. Although this was still relatively elevated compared to normal values, the patient demonstrated clinical relief with improved shortness of breath nearly immediately after the thrombectomy, with concordant improvement in the patient's vital signs  including improved oxygenation and resolution of tachycardia. Given these improvements and known low volume clot in the left pulmonary artery, the procedure was stopped at this point. All wires and catheters were removed. Hemostasis was achieved at the access site using a pursestring suture. A clean dressing was placed. The patient tolerated the procedure well without immediate complication, and was returned to per original disposition in the critical care unit in stable condition. FINDINGS: As above. IMPRESSION: Successful catheter directed mechanical/aspiration thrombectomy of the right pulmonary artery using the [place] FlowTriever device, with large volume of acute and subacute clot removed. The patient reported near immediate improvement in clinical symptomatology, with concordant improvement in the patient's vital signs including improved oxygenation and resolution of tachycardia. Electronically Signed   By: Albin Felling M.D.   On: 04/27/2021 10:52   IR US Guide Vasc Access Right  Result Date: 04/27/2021 INDICATION: Pulmonary embolism, intermediate low risk pulmonary embolism with symptoms including shortness of breath with minimal activity EXAM: 1. Ultrasound-guided puncture of the right common femoral vein 2. Catheterization and venography of the right pulmonary artery 3. Mechanical/aspiration thrombectomy of the right pulmonary artery 4. Pressure measurements of the right pulmonary artery COMPARISON:  CTA chest same day MEDICATIONS: None. ANESTHESIA/SEDATION: Moderate (conscious) sedation was employed during this procedure. A total of Versed 2  mg and Fentanyl 75 mcg was administered intravenously by the radiology nurse. Total intra-service moderate Sedation Time: 66 minutes. The patient's level of consciousness and vital signs were monitored continuously by radiology nursing throughout the procedure under my direct supervision. FLUOROSCOPY TIME:  FLUOROSCOPY TIME Fluoroscopy Time: 23.1 minutes with 5 exposures COMPLICATIONS: None immediate. TECHNIQUE: Informed written consent was obtained from the patient after a thorough discussion of the procedural risks, benefits and alternatives. All questions were addressed. Maximal Sterile Barrier Technique was utilized including caps, mask, sterile gowns, sterile gloves, sterile drape, hand hygiene and skin antiseptic. A timeout was performed prior to the initiation of the procedure. The patient was placed supine on the exam table. The right groin was prepped and draped in the standard sterile fashion. Ultrasound demonstrated a widely patent right common femoral vein. A permanent image was stored in the electronic medical record. Under ultrasound guidance, the right common femoral vein was directly punctured using a 21 gauge micropuncture set with visualization of needle entry into the vessel lumen. The access site was serially dilated to accommodate a short 6 [place] sheath. Wire was advanced centrally into the IVC, and initial attempts were made to catheterize the pulmonary artery using an angled pigtail catheter. Due to tortuosity of the pulmonary artery bifurcation, this was initially unsuccessful. Eventually, the right pulmonary artery was able to be catheterized using a combination of a long 6 [place] sheath, reinforced angled catheter and Bentson guidewire. Pulmonary artery angiogram of the right pulmonary artery was performed, demonstrating large volume clot in the distal aspect of the main right pulmonary artery. Wire was exchanged for a short tapered Amplatz guidewire, over which a 24 French sheath was  introduced into the access site. While maintaining wire position, an Inari 24 [place] FlowTriever aspiration thrombectomy catheter was advanced into the more distal right pulmonary artery. Multiple aspiration thrombectomy passes were then performed of the right pulmonary artery. Approximally 5 passes were performed, with blood returned using the FlowSaver syringe. Large amounts of acute and subacute clot was retrieved. Post thrombectomy right pulmonary artery angiogram was then performed through the aspiration catheter, demonstrating near-complete resolution of the proximal clot, with small volume residual subsegmental embolism,  not amenable to further aspiration thrombectomy. Pulmonary artery pressures were then transducer, which were estimated at 58/23 (mean 35) mm Hg. Although this was still relatively elevated compared to normal values, the patient demonstrated clinical relief with improved shortness of breath nearly immediately after the thrombectomy, with concordant improvement in the patient's vital signs including improved oxygenation and resolution of tachycardia. Given these improvements and known low volume clot in the left pulmonary artery, the procedure was stopped at this point. All wires and catheters were removed. Hemostasis was achieved at the access site using a pursestring suture. A clean dressing was placed. The patient tolerated the procedure well without immediate complication, and was returned to per original disposition in the critical care unit in stable condition. FINDINGS: As above. IMPRESSION: Successful catheter directed mechanical/aspiration thrombectomy of the right pulmonary artery using the Inari FlowTriever device, with large volume of acute and subacute clot removed. The patient reported near immediate improvement in clinical symptomatology, with concordant improvement in the patient's vital signs including improved oxygenation and resolution of tachycardia. Electronically Signed    By: Albin Felling M.D.   On: 04/27/2021 10:52    Assessment and plan- Patient is a 88 y.o. female with prior history of renal cell carcinoma and breast cancer now presenting with an unprovoked pulmonary embolism referred for further management  Discussed with the patient that pulmonary embolism can be unprovoked or provoked.  Patient does have a prior history of renal cell cancer as well as breast cancer.  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  CT chest did not show any evidence of malignancy.  However I would like to get a CT abdomen and pelvis without contrast as well as bone scan to make sure that there is no recurrent malignancy given her recent PE.  She does have CKD with a creatinine of 1.4 and therefore I would like to avoid IV contrast at this time.  I will also order hypercoagulable work-up including antiphospholipid antibody testing although it may not significantly change management at her age.  Patient is keen to come off anticoagulation after 6 months.  She will remain on Eliquis for now.  Retrospective studies have shown that oral anticoagulants are safe in patients with chronic kidney disease although she may be at some increased risk of bleeding in this setting.  So far she seems to be tolerating Eliquis well without any significant side effects.  I would like her to stay on Eliquis for at least 6 months before deciding if she can come off anticoagulation or not  I will see her back after the results of her scans and blood work are back.   Thank you for this kind referral and the opportunity to participate in the care of this patient   Visit Diagnosis 1. Other acute pulmonary embolism with acute cor pulmonale (HCC)   2. Elevated total protein   3. Renal cell carcinoma of right kidney ([place])   4. History of breast cancer     Dr. Randa Evens, MD, MPH [facility] at [facility] [phone] 05/23/2021

## 2021-05-24 LAB — DRVVT CONFIRM: dRVVT Confirm: 1.3 ratio — ABNORMAL HIGH (ref 0.8–1.2)

## 2021-05-24 LAB — HEXAGONAL PHASE PHOSPHOLIPID: Hex Phosph Neut Test: 3 s (ref 0–11)

## 2021-05-24 LAB — BETA-2-GLYCOPROTEIN I ABS, IGG/M/A
Beta-2 Glyco I IgG: <9 GPI IgG units (ref 0–20)
Beta-2-Glycoprotein I IgA: <9 GPI IgA units (ref 0–25)
Beta-2-Glycoprotein I IgM: 42 GPI IgM units — ABNORMAL HIGH (ref 0–32)

## 2021-05-24 LAB — PROTEIN S PANEL
Protein S Activity: 118 % (ref 63–140)
Protein S Ag, Free: 114 % (ref 61–136)
Protein S Ag, Total: 139 % (ref 60–150)

## 2021-05-24 LAB — LUPUS ANTICOAGULANT
DRVVT: 71.4 s — ABNORMAL HIGH (ref 0.0–47.0)
PTT Lupus Anticoagulant: 38.8 s (ref 0.0–51.9)
Thrombin Time: 17.7 s (ref 0.0–23.0)
dPT Confirm Ratio: 1.06 Ratio (ref 0.00–1.34)
dPT: 44.6 s (ref 0.0–47.6)

## 2021-05-24 LAB — DRVVT MIX: dRVVT Mix: 49.3 s — ABNORMAL HIGH (ref 0.0–40.4)

## 2021-05-24 LAB — HEX PHASE PHOSPHOLIPID REFLEX

## 2021-05-24 LAB — ANTITHROMBIN III: AntiThromb III Func: 0 % — ABNORMAL LOW (ref 75–120)

## 2021-05-26 LAB — KAPPA/LAMBDA LIGHT CHAINS
Kappa free light chain: 17 mg/L (ref 3.3–19.4)
Kappa, lambda light chain ratio: 1.38 (ref 0.26–1.65)
Lambda free light chains: 12.3 mg/L (ref 5.7–26.3)

## 2021-05-26 LAB — CARDIOLIPIN ANTIBODIES, IGG, IGM, IGA
Anticardiolipin IgA: <9 APL U/mL (ref 0–11)
Anticardiolipin IgG: <9 GPL U/mL (ref 0–14)
Anticardiolipin IgM: 23 MPL U/mL — ABNORMAL HIGH (ref 0–12)

## 2021-05-26 LAB — PROTEIN C, TOTAL: Protein C, Total: 125 % (ref 60–150)

## 2021-05-27 LAB — FACTOR 5 LEIDEN

## 2021-05-28 LAB — MULTIPLE MYELOMA PANEL, SERUM
Albumin SerPl Elph-Mcnc: 3.6 g/dL (ref 2.9–4.4)
Albumin/Glob SerPl: 1.5 (ref 0.7–1.7)
Alpha 1: 0.3 g/dL (ref 0.0–0.4)
Alpha2 Glob SerPl Elph-Mcnc: 0.7 g/dL (ref 0.4–1.0)
B-Globulin SerPl Elph-Mcnc: 0.9 g/dL (ref 0.7–1.3)
Gamma Glob SerPl Elph-Mcnc: 0.7 g/dL (ref 0.4–1.8)
Globulin, Total: 2.5 g/dL (ref 2.2–3.9)
IgA: 138 mg/dL (ref 64–422)
IgG (Immunoglobin G), Serum: 644 mg/dL (ref 586–1602)
IgM (Immunoglobulin M), Srm: 110 mg/dL (ref 26–217)
Total Protein ELP: 6.1 g/dL (ref 6.0–8.5)

## 2021-05-28 LAB — PROTHROMBIN GENE MUTATION

## 2021-06-02 DIAGNOSIS — I2609 Other pulmonary embolism with acute cor pulmonale: Secondary | ICD-10-CM | POA: Diagnosis not present

## 2021-06-02 DIAGNOSIS — I10 Essential (primary) hypertension: Secondary | ICD-10-CM | POA: Diagnosis not present

## 2021-06-05 DIAGNOSIS — E041 Nontoxic single thyroid nodule: Secondary | ICD-10-CM | POA: Diagnosis not present

## 2021-06-09 DIAGNOSIS — Z853 Personal history of malignant neoplasm of breast: Secondary | ICD-10-CM | POA: Diagnosis not present

## 2021-06-09 DIAGNOSIS — I10 Essential (primary) hypertension: Secondary | ICD-10-CM | POA: Diagnosis not present

## 2021-06-09 DIAGNOSIS — I2510 Atherosclerotic heart disease of native coronary artery without angina pectoris: Secondary | ICD-10-CM | POA: Diagnosis not present

## 2021-06-09 DIAGNOSIS — Z86711 Personal history of pulmonary embolism: Secondary | ICD-10-CM | POA: Diagnosis not present

## 2021-06-09 DIAGNOSIS — Z8781 Personal history of (healed) traumatic fracture: Secondary | ICD-10-CM | POA: Diagnosis not present

## 2021-06-09 DIAGNOSIS — E7849 Other hyperlipidemia: Secondary | ICD-10-CM | POA: Diagnosis not present

## 2021-06-09 DIAGNOSIS — I700 Atherosclerosis of aorta: Secondary | ICD-10-CM | POA: Diagnosis not present

## 2021-06-09 DIAGNOSIS — K219 Gastro-esophageal reflux disease without esophagitis: Secondary | ICD-10-CM | POA: Diagnosis not present

## 2021-06-09 DIAGNOSIS — I480 Paroxysmal atrial fibrillation: Secondary | ICD-10-CM | POA: Diagnosis not present

## 2021-06-09 DIAGNOSIS — N1832 Chronic kidney disease, stage 3b: Secondary | ICD-10-CM | POA: Diagnosis not present

## 2021-06-09 DIAGNOSIS — Z85528 Personal history of other malignant neoplasm of kidney: Secondary | ICD-10-CM | POA: Diagnosis not present

## 2021-06-09 DIAGNOSIS — E042 Nontoxic multinodular goiter: Secondary | ICD-10-CM | POA: Diagnosis not present

## 2021-06-13 ENCOUNTER — Other Ambulatory Visit: Payer: Self-pay

## 2021-06-13 ENCOUNTER — Encounter
Admission: RE | Admit: 2021-06-13 | Discharge: 2021-06-13 | Disposition: A | Discharge status: Home / Self Care | Payer: PPO | Source: Ambulatory Visit | Attending: Oncology | Admitting: Oncology

## 2021-06-13 ENCOUNTER — Ambulatory Visit
Admission: RE | Admit: 2021-06-13 | Discharge: 2021-06-13 | Disposition: A | Discharge status: Home / Self Care | Payer: PPO | Source: Ambulatory Visit | Attending: Oncology | Admitting: Oncology

## 2021-06-13 DIAGNOSIS — N281 Cyst of kidney, acquired: Secondary | ICD-10-CM | POA: Diagnosis not present

## 2021-06-13 DIAGNOSIS — C641 Malignant neoplasm of right kidney, except renal pelvis: Secondary | ICD-10-CM | POA: Insufficient documentation

## 2021-06-13 DIAGNOSIS — R778 Other specified abnormalities of plasma proteins: Secondary | ICD-10-CM

## 2021-06-13 DIAGNOSIS — I2609 Other pulmonary embolism with acute cor pulmonale: Secondary | ICD-10-CM | POA: Insufficient documentation

## 2021-06-13 DIAGNOSIS — I700 Atherosclerosis of aorta: Secondary | ICD-10-CM | POA: Diagnosis not present

## 2021-06-13 DIAGNOSIS — K828 Other specified diseases of gallbladder: Secondary | ICD-10-CM | POA: Diagnosis not present

## 2021-06-13 DIAGNOSIS — Z853 Personal history of malignant neoplasm of breast: Secondary | ICD-10-CM

## 2021-06-13 DIAGNOSIS — M19071 Primary osteoarthritis, right ankle and foot: Secondary | ICD-10-CM | POA: Diagnosis not present

## 2021-06-13 DIAGNOSIS — C649 Malignant neoplasm of unspecified kidney, except renal pelvis: Secondary | ICD-10-CM | POA: Diagnosis not present

## 2021-06-13 DIAGNOSIS — M1711 Unilateral primary osteoarthritis, right knee: Secondary | ICD-10-CM | POA: Diagnosis not present

## 2021-06-13 DIAGNOSIS — I2510 Atherosclerotic heart disease of native coronary artery without angina pectoris: Secondary | ICD-10-CM | POA: Diagnosis not present

## 2021-06-13 DIAGNOSIS — K5790 Diverticulosis of intestine, part unspecified, without perforation or abscess without bleeding: Secondary | ICD-10-CM | POA: Diagnosis not present

## 2021-06-13 DIAGNOSIS — C50919 Malignant neoplasm of unspecified site of unspecified female breast: Secondary | ICD-10-CM | POA: Diagnosis not present

## 2021-06-13 DIAGNOSIS — Z85528 Personal history of other malignant neoplasm of kidney: Secondary | ICD-10-CM | POA: Diagnosis not present

## 2021-06-13 MED ORDER — TECHNETIUM TC 99M MEDRONATE IV KIT
20.0000 | PACK | Freq: Once | INTRAVENOUS | Status: AC | PRN
  Administered 2021-06-13: 20.7 via INTRAVENOUS

## 2021-06-20 ENCOUNTER — Other Ambulatory Visit: Payer: Self-pay

## 2021-06-20 ENCOUNTER — Encounter: Payer: Self-pay | Admitting: Oncology

## 2021-06-20 ENCOUNTER — Inpatient Hospital Stay: Payer: PPO | Attending: Oncology | Admitting: Oncology

## 2021-06-20 VITALS — BP 140/78 | HR 58 | Temp 98.1°F | Resp 16 | Ht 62.0 in | Wt 166.8 lb

## 2021-06-20 DIAGNOSIS — Z7901 Long term (current) use of anticoagulants: Secondary | ICD-10-CM

## 2021-06-20 DIAGNOSIS — Z86711 Personal history of pulmonary embolism: Secondary | ICD-10-CM | POA: Diagnosis not present

## 2021-06-20 DIAGNOSIS — D6851 Activated protein C resistance: Secondary | ICD-10-CM | POA: Insufficient documentation

## 2021-06-20 DIAGNOSIS — I2609 Other pulmonary embolism with acute cor pulmonale: Secondary | ICD-10-CM | POA: Diagnosis not present

## 2021-06-20 NOTE — Progress Notes (Signed)
Pt has no concerns of breast and none from DVt. She feels that she is doing good.

## 2021-06-20 NOTE — Progress Notes (Signed)
Hematology/Oncology Consult note [facility]  Telephone:([phone] [phone]  Patient Care Team: Adin Hector, MD as PCP - General (Internal Medicine) Clent Jacks, RN as Registered Nurse Hollice Espy, MD as Consulting Physician (Urology) Theodore Demark, RN as Oncology Nurse Navigator Sindy Guadeloupe, MD as Consulting Physician (Hematology and Oncology) Adin Hector, MD as Attending Physician (Family Medicine) Bary Castilla Forest Gleason, MD as Consulting Physician (General Surgery)   Name of the patient: Tara Ramos  [id number]  1932/01/11   Date of visit: 06/20/21  Diagnosis-unprovoked pulmonary embolism  Chief complaint/ Reason for visit-discuss results of blood work  Heme/Onc history: Patient is a 88 year old female with a past medical history significant for breast cancer in 1990s status postlumpectomy and radiation.  She had recurrent disease in 1992 and 2014 as well  in 2021.  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  She is s/p left simple mastectomy in September 2021 and modified radical mastectomy of the right breast at the same time. 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   She presented to the ER on 04/26/2021 with symptoms of worsening shortness of breath and had a CT angio chest which showed acute pulmonary embolism with right heart strain.  Enlarging 19 mm left thyroid nodule and 6 mm right solid pulmonary nodule.  Patient underwent thrombectomy with vascular surgery and was placed on Eliquis.  She has baseline CKD with a creatinine of around 1.4.  She is not currently on any home oxygen.  Results of hypercoagulable work-up showed heterozygosity forFactor V Leiden mutation.  Antiphospholipid antibody panel showed elevated IgM beta 2 glycoprotein level of 42.  Antithrombin III levels were reported to be 0%.  Other hypercoagulable work-up was  negative.  Interval history-tolerating Eliquis well without any significant side effects.  She is doing well for her age otherwise.  No recent falls  ECOG PS- 1 Pain scale- 0   Review of systems- Review of Systems  Constitutional:  Negative for chills, fever, malaise/fatigue and weight loss.  HENT:  Negative for congestion, ear discharge and nosebleeds.   Eyes:  Negative for blurred vision.  Respiratory:  Negative for cough, hemoptysis, sputum production, shortness of breath and wheezing.   Cardiovascular:  Negative for chest pain, palpitations, orthopnea and claudication.  Gastrointestinal:  Negative for abdominal pain, blood in stool, constipation, diarrhea, heartburn, melena, nausea and vomiting.  Genitourinary:  Negative for dysuria, flank pain, frequency, hematuria and urgency.  Musculoskeletal:  Negative for back pain, joint pain and myalgias.  Skin:  Negative for rash.  Neurological:  Negative for dizziness, tingling, focal weakness, seizures, weakness and headaches.  Endo/Heme/Allergies:  Does not bruise/bleed easily.  Psychiatric/Behavioral:  Negative for depression and suicidal ideas. The patient does not have insomnia.      Allergies  Allergen Reactions   Morphine And Related Nausea And Vomiting   Lisinopril Cough   Tape Other (See Comments)    Blisters underneath; wide white hypofix tape post kidney biopsy      Past Medical History:  Diagnosis Date   Anticoagulant long-term use    Arthritis    Breast cancer ([place]) 1992   right breast/radiation   Breast cancer ([place]) 1990   left breast/radiation   Breast cancer ([place]) 2014   right breast   Breast cancer, right breast ([place]) 12/16/2012   left, then right, then right breast cancers (lumpectomies  and radiation therapy)   Chronic kidney disease    Dysrhythmia    HX A FIB - FOLLOWED BY [name]   GERD (gastroesophageal reflux disease)    Heart murmur    hx of years ago    Hemorrhoids    Hypertension    Personal  history of radiation therapy    1990/1992   Pulmonary emboli [facility])    Renal cell cancer, right ([place]) 11/2014   Right Nephrectomy and Left renal ablation for lesion.    Renal insufficiency    Status post radiation therapy    breast cancer bilateral     Past Surgical History:  Procedure Laterality Date   BREAST BIOPSY Right 03/10/2016   -  STROMAL SCLEROSIS, GIANT CELL REACTION, CHRONIC INFLAMMATION AND FAT    BREAST BIOPSY Right 12/12/2019   [place] bx of mass, vision marker, path pending   BREAST BIOPSY Right 12/12/2019   [place] bx of LN, hydromarker, path pending   BREAST BIOPSY Left 12/12/2019   stereo bx of calcs, coil marker, path pending   BREAST EXCISIONAL BIOPSY Right 2014   +   BREAST EXCISIONAL BIOPSY Right 1992   +   BREAST EXCISIONAL BIOPSY Left 1990   +   BREAST LUMPECTOMY Left 1990   BREAST LUMPECTOMY Right 1992   BROW LIFT Bilateral 11/10/2019   Procedure: BLEPHAROPLASTY UPPER EYELID; W/EXCESS SKIN BILATERAL & ECTROPION REPAIR, EXTENSIVE LEFT LOWER LID;  Surgeon: Karle Starch, MD;  Location: [place];  Service: Ophthalmology;  Laterality: Bilateral;   DILATION AND CURETTAGE OF UTERUS     EYE SURGERY Bilateral    cataract extraction   IR THROMBECT PRIM MECH INIT (INCLU) MOD SED  04/26/2021   IR US GUIDE VASC ACCESS RIGHT  04/26/2021   KYPHOPLASTY N/A 10/09/2014   Procedure: KYPHOPLASTY;  Surgeon: Hessie Knows, MD;  Location: [facility];  Service: Orthopedics;  Laterality: N/A;  T7 Kyphoplasty with bone biopsy   MASTECTOMY MODIFIED RADICAL Right 01/08/2020   Procedure: MASTECTOMY MODIFIED RADICAL;  Surgeon: Robert Bellow, MD;  Location: [facility];  Service: General;  Laterality: Right;   OTHER SURGICAL HISTORY     radiation therapy breast   ROBOT ASSISTED LAPAROSCOPIC NEPHRECTOMY Right 02/01/2015   Procedure: ROBOTIC ASSISTED LAPAROSCOPIC RIGHT NEPHRECTOMY;  Surgeon: Cleon Gustin, MD;  Location: [facility];  Service: Urology;  Laterality: Right;   SIMPLE  MASTECTOMY WITH AXILLARY SENTINEL NODE BIOPSY Left 01/08/2020   Procedure: SIMPLE MASTECTOMY;  Surgeon: Robert Bellow, MD;  Location: [facility];  Service: General;  Laterality: Left;    Social History   Socioeconomic History   Marital status: Widowed    Spouse name: Not on file   Number of children: Not on file   Years of education: Not on file   Highest education level: Not on file  Occupational History   Not on file  Tobacco Use   Smoking status: Former    Years: 20.00    Types: Cigarettes    Quit date: 04/27/1972    Years since quitting: 49.1   Smokeless tobacco: Never  Vaping Use   Vaping Use: Never used  Substance and Sexual Activity   Alcohol use: Yes    Alcohol/week: 1.0 standard drink    Types: 1 Glasses of wine per week    Comment: 1 glass of wine with dinner occ   Drug use: No   Sexual activity: Never  Other Topics Concern   Not on file  Social History Narrative  Not on file   Social Determinants of Health   Financial Resource Strain: Not on file  Food Insecurity: Not on file  Transportation Needs: Not on file  Physical Activity: Not on file  Stress: Not on file  Social Connections: Not on file  Intimate Partner Violence: Not on file    Family History  Problem Relation Age of Onset   Stomach cancer Mother    Stroke Father    Brain cancer Sister    Breast cancer Daughter 74   Bladder Cancer Neg Hx    Prostate cancer Neg Hx    Kidney cancer Neg Hx      Current Outpatient Medications:    amLODipine (NORVASC) 10 MG tablet, Take 10 mg by mouth daily., Disp: , Rfl:    apixaban (ELIQUIS) 5 MG TABS tablet, Take 1 tablet (5 mg total) by mouth 2 (two) times daily., Disp: 60 tablet, Rfl: 3   Ascorbic Acid (VITAMIN C) 1000 MG tablet, Take 1,000 mg by mouth daily. , Disp: , Rfl:    Calcium Carb-Cholecalciferol (CALCIUM + D3) 600-200 MG-UNIT TABS, Take 1 tablet by mouth daily. , Disp: , Rfl:    docusate sodium (COLACE) 100 MG capsule, Take 100 mg by mouth  at bedtime., Disp: , Rfl:    losartan (COZAAR) 100 MG tablet, Take 100 mg by mouth every morning. , Disp: , Rfl:    metoprolol (LOPRESSOR) 100 MG tablet, Take 100 mg by mouth 2 (two) times daily., Disp: , Rfl:    Multiple Vitamin (MULTIVITAMIN WITH MINERALS) TABS tablet, Take 1 tablet by mouth daily., Disp: , Rfl:    spironolactone (ALDACTONE) 25 MG tablet, Take 25 mg by mouth daily., Disp: , Rfl:   Physical exam:  Vitals:   06/20/21 1010  BP: 140/78  Pulse: (!) 58  Resp: 16  Temp: 98.1 F (36.7 C)  TempSrc: Oral  Weight: 166 lb 12.8 oz (75.7 kg)  Height: 5\' 2"  (1.575 m)   Physical Exam Constitutional:      General: She is not in acute distress. Cardiovascular:     Rate and Rhythm: Normal rate and regular rhythm.     Heart sounds: Normal heart sounds.  Pulmonary:     Effort: Pulmonary effort is normal.     Breath sounds: Normal breath sounds.  Abdominal:     General: Bowel sounds are normal.     Palpations: Abdomen is soft.  Skin:    General: Skin is warm and dry.  Neurological:     Mental Status: She is alert and oriented to person, place, and time.     CMP Latest Ref Rng & Units 05/23/2021  Glucose 70 - 99 mg/dL 88  BUN 8 - 23 mg/dL 28(H)  Creatinine 0.44 - 1.00 mg/dL 1.28(H)  Sodium 135 - 145 mmol/L 138  Potassium 3.5 - 5.1 mmol/L 4.3  Chloride 98 - 111 mmol/L 102  CO2 22 - 32 mmol/L 28  Calcium 8.9 - 10.3 mg/dL 9.5  Total Protein 6.5 - 8.1 g/dL 6.6  Total Bilirubin 0.3 - 1.2 mg/dL 0.8  Alkaline Phos 38 - 126 U/L 59  AST 15 - 41 U/L 22  ALT 0 - 44 U/L 30   CBC Latest Ref Rng & Units 05/23/2021  WBC 4.0 - 10.5 K/uL 7.7  Hemoglobin 12.0 - 15.0 g/dL 15.7(H)  Hematocrit 36.0 - 46.0 % 49.2(H)  Platelets 150 - 400 K/uL 273    No images are attached to the encounter.  NM Bone Scan Whole  Body  Result Date: 06/15/2021 CLINICAL DATA:  History of renal and breast cancer. EXAM: NUCLEAR MEDICINE WHOLE BODY BONE SCAN TECHNIQUE: Whole body anterior and posterior images  were obtained approximately 3 hours after intravenous injection of radiopharmaceutical. RADIOPHARMACEUTICALS:  20.7 mCi Technetium-21m MDP IV COMPARISON:  CT scan of the chest, abdomen, and pelvis June 13, 2021 FINDINGS: Mild uptake at left first costochondral junctions. Degenerative changes at the [facility] joints, first costochondral junctions, right knee, spine, and feet. No other bony or soft tissue abnormalities identified. IMPRESSION: 1. Uptake at 3 left costochondral junctions, likely from previous trauma. No CT correlate to suggest metastatic disease. Recommend attention on follow-up. 2. No other scintigraphic evidence of bony metastatic disease. 3. Degenerative changes as above. Electronically Signed   By: Dorise Bullion III M.D.   On: 06/15/2021 13:43   CT CHEST ABDOMEN PELVIS WO CONTRAST  Result Date: 06/14/2021 CLINICAL DATA:  Renal cell and breast cancer.  Restaging. EXAM: CT CHEST, ABDOMEN AND PELVIS WITHOUT CONTRAST TECHNIQUE: Multidetector CT imaging of the chest, abdomen and pelvis was performed following the standard protocol without IV contrast. RADIATION DOSE REDUCTION: This exam was performed according to the departmental dose-optimization program which includes automated exposure control, adjustment of the mA and/or kV according to patient size and/or use of iterative reconstruction technique. COMPARISON:  CTA chest 04/26/2021.  Abdomen/pelvis CT 02/04/2018 FINDINGS: CT CHEST FINDINGS Cardiovascular: The heart size is normal. No substantial pericardial effusion. Coronary artery calcification is evident. Moderate atherosclerotic calcification is noted in the wall of the thoracic aorta. Ascending thoracic aorta measures 4.2 cm diameter Mediastinum/Nodes: 1.7 cm left thyroid nodule. No mediastinal lymphadenopathy. No evidence for gross hilar lymphadenopathy although assessment is limited by the lack of intravenous contrast on the current study. The esophagus has normal imaging features. There is  no axillary lymphadenopathy. Lungs/Pleura: Stable 6-7 mm anterior right upper lobe nodule on 47/4. No new suspicious pulmonary nodule or mass. No focal airspace consolidation. No pleural effusion. Musculoskeletal: No worrisome lytic or sclerotic osseous abnormality. CT ABDOMEN PELVIS FINDINGS Hepatobiliary: No suspicious focal abnormality in the liver on this study without intravenous contrast. Hypervascular lesion seen on the 2019 CT is not visible on today's noncontrast exam. Mural calcification the gallbladder fundus is stable, presumably related to adenomyomatosis. No intrahepatic or extrahepatic biliary dilation. Pancreas: No focal mass lesion. No dilatation of the main duct. No intraparenchymal cyst. No peripancreatic edema. Spleen: No splenomegaly. No focal mass lesion. Adrenals/Urinary Tract: Mild thickening of both adrenal glands is similar to prior. Right kidney surgically absent. No unexpected or suspicious soft tissue in the right nephrectomy bed. Apparent ablation defect again noted upper pole left kidney. Similar appearance exophytic cyst upper pole left kidney with another smaller cyst in the anterior lower pole similar to prior. Insert normal ureter The urinary bladder appears normal for the degree of distention. Stomach/Bowel: Stomach is unremarkable. No gastric wall thickening. No evidence of outlet obstruction. Duodenum is normally positioned as is the ligament of Treitz. No small bowel wall thickening. No small bowel dilatation. The terminal ileum is normal. The appendix is normal. No gross colonic mass. No colonic wall thickening. Diverticular changes are noted in the left colon without evidence of diverticulitis. Vascular/Lymphatic: There is moderate atherosclerotic calcification of the abdominal aorta without aneurysm. There is no gastrohepatic or hepatoduodenal ligament lymphadenopathy. No retroperitoneal or mesenteric lymphadenopathy. No pelvic sidewall lymphadenopathy. Reproductive: There  is no adnexal mass. Other: No intraperitoneal free fluid. Musculoskeletal: No worrisome lytic or sclerotic osseous abnormality. Vertebral augmentation noted  midthoracic vertebral body. IMPRESSION: 1. Stable exam. No new or progressive findings in the chest, abdomen, or pelvis. 2. Status post right nephrectomy. No unexpected or suspicious soft tissue in the right nephrectomy bed. Ablation defect again noted upper pole left kidney, stable. 3. 1.7 cm left thyroid nodule. Recommend thyroid US (ref: J Am Coll Radiol. 2015 Feb;12(2): 143-50). 4. Stable 6-7 mm anterior right upper lobe pulmonary nodule. Non-contrast chest CT at 6-12 months is recommended. If the nodule is stable at time of repeat CT, then future CT at 18-24 months (from today's scan) is considered optional for low-risk patients, but is recommended for high-risk patients. This recommendation follows the consensus statement: Guidelines for Management of Incidental Pulmonary Nodules Detected on CT Images: From the Fleischner Society 2017; Radiology 2017; 284:228-243. 5. Aortic Atherosclerosis (ICD10-I70.0). Electronically Signed   By: Misty Stanley M.D.   On: 06/14/2021 13:33     Assessment and plan- Patient is a 88 y.o. female with unprovoked pulmonary embolism currently on Eliquis here to discuss results of hypercoagulable work-up  Patient had an elevated IgM beta-2 glycoprotein titer of 42.  I would like to repeat that again in 3 months time and if the titers are consistently more than 40-she would qualify for the diagnosis of antiphospholipid antibody syndrome.  It is unclear as to why her Antithrombin III levels were reported to be 0% and I will repeat that as well in 3 months.  Moreover patient is heterozygous for factor V Leiden which typically does not affect the decision to continue or stop anticoagulation which she does have an increased risk of thromboembolic events because of that as compared to general population.  For all these reasons  patient will stay presently on Eliquis and I will see her back in 3 months with labs.  Patient verbalized understanding of the plan   Visit Diagnosis 1. History of pulmonary embolism   2. Long term current use of anticoagulant therapy      Dr. Randa Evens, MD, MPH [facility] at [facility] [phone] 06/20/2021 3:57 PM

## 2021-09-09 ENCOUNTER — Other Ambulatory Visit: Payer: PPO

## 2021-09-11 ENCOUNTER — Other Ambulatory Visit: Payer: PPO

## 2021-09-11 DIAGNOSIS — D6851 Activated protein C resistance: Secondary | ICD-10-CM | POA: Diagnosis not present

## 2021-09-11 DIAGNOSIS — E785 Hyperlipidemia, unspecified: Secondary | ICD-10-CM | POA: Diagnosis not present

## 2021-09-11 DIAGNOSIS — I700 Atherosclerosis of aorta: Secondary | ICD-10-CM | POA: Diagnosis not present

## 2021-09-11 DIAGNOSIS — M791 Myalgia, unspecified site: Secondary | ICD-10-CM | POA: Diagnosis not present

## 2021-09-11 DIAGNOSIS — I480 Paroxysmal atrial fibrillation: Secondary | ICD-10-CM | POA: Diagnosis not present

## 2021-09-11 DIAGNOSIS — I10 Essential (primary) hypertension: Secondary | ICD-10-CM | POA: Diagnosis not present

## 2021-09-11 DIAGNOSIS — Z85528 Personal history of other malignant neoplasm of kidney: Secondary | ICD-10-CM | POA: Diagnosis not present

## 2021-09-11 DIAGNOSIS — N1832 Chronic kidney disease, stage 3b: Secondary | ICD-10-CM | POA: Diagnosis not present

## 2021-09-11 DIAGNOSIS — Z853 Personal history of malignant neoplasm of breast: Secondary | ICD-10-CM | POA: Diagnosis not present

## 2021-09-11 DIAGNOSIS — I2510 Atherosclerotic heart disease of native coronary artery without angina pectoris: Secondary | ICD-10-CM | POA: Diagnosis not present

## 2021-09-12 ENCOUNTER — Other Ambulatory Visit: Payer: Self-pay

## 2021-09-12 ENCOUNTER — Inpatient Hospital Stay: Payer: PPO | Attending: Oncology

## 2021-09-12 DIAGNOSIS — Z86711 Personal history of pulmonary embolism: Secondary | ICD-10-CM | POA: Insufficient documentation

## 2021-09-12 DIAGNOSIS — Z853 Personal history of malignant neoplasm of breast: Secondary | ICD-10-CM

## 2021-09-12 DIAGNOSIS — Z7901 Long term (current) use of anticoagulants: Secondary | ICD-10-CM | POA: Diagnosis not present

## 2021-09-12 LAB — CBC WITH DIFFERENTIAL/PLATELET
Abs Immature Granulocytes: 0.02 10*3/uL (ref 0.00–0.07)
Basophils Absolute: 0 10*3/uL (ref 0.0–0.1)
Basophils Relative: 1 %
Eosinophils Absolute: 0.2 10*3/uL (ref 0.0–0.5)
Eosinophils Relative: 3 %
HCT: 50.6 % — ABNORMAL HIGH (ref 36.0–46.0)
Hemoglobin: 16.5 g/dL — ABNORMAL HIGH (ref 12.0–15.0)
Immature Granulocytes: 0 %
Lymphocytes Relative: 12 %
Lymphs Abs: 0.8 10*3/uL (ref 0.7–4.0)
MCH: 30.6 pg (ref 26.0–34.0)
MCHC: 32.6 g/dL (ref 30.0–36.0)
MCV: 93.9 fL (ref 80.0–100.0)
Monocytes Absolute: 0.5 10*3/uL (ref 0.1–1.0)
Monocytes Relative: 8 %
Neutro Abs: 5.2 10*3/uL (ref 1.7–7.7)
Neutrophils Relative %: 76 %
Platelets: 255 10*3/uL (ref 150–400)
RBC: 5.39 MIL/uL — ABNORMAL HIGH (ref 3.87–5.11)
RDW: 13.7 % (ref 11.5–15.5)
WBC: 6.8 10*3/uL (ref 4.0–10.5)
nRBC: 0 % (ref 0.0–0.2)

## 2021-09-12 LAB — COMPREHENSIVE METABOLIC PANEL
ALT: 31 U/L (ref 0–44)
AST: 27 U/L (ref 15–41)
Albumin: 3.7 g/dL (ref 3.5–5.0)
Alkaline Phosphatase: 61 U/L (ref 38–126)
Anion gap: 9 (ref 5–15)
BUN: 28 mg/dL — ABNORMAL HIGH (ref 8–23)
CO2: 28 mmol/L (ref 22–32)
Calcium: 9.4 mg/dL (ref 8.9–10.3)
Chloride: 102 mmol/L (ref 98–111)
Creatinine, Ser: 1.45 mg/dL — ABNORMAL HIGH (ref 0.44–1.00)
GFR, Estimated: 35 mL/min — ABNORMAL LOW (ref >60)
Glucose, Bld: 85 mg/dL (ref 70–99)
Potassium: 4.7 mmol/L (ref 3.5–5.1)
Sodium: 139 mmol/L (ref 135–145)
Total Bilirubin: 0.7 mg/dL (ref 0.3–1.2)
Total Protein: 6.5 g/dL (ref 6.5–8.1)

## 2021-09-12 LAB — ANTITHROMBIN III: AntiThromb III Func: 103 % (ref 75–120)

## 2021-09-13 LAB — BETA-2-GLYCOPROTEIN I ABS, IGG/M/A
Beta-2 Glyco I IgG: <9 GPI IgG units (ref 0–20)
Beta-2-Glycoprotein I IgA: <9 GPI IgA units (ref 0–25)
Beta-2-Glycoprotein I IgM: 14 GPI IgM units (ref 0–32)

## 2021-09-16 ENCOUNTER — Ambulatory Visit: Payer: PPO | Admitting: Oncology

## 2021-09-19 ENCOUNTER — Inpatient Hospital Stay: Payer: PPO | Admitting: Oncology

## 2021-09-19 ENCOUNTER — Encounter: Payer: Self-pay | Admitting: Oncology

## 2021-09-19 VITALS — BP 146/76 | HR 56 | Temp 98.4°F | Resp 16 | Ht 62.0 in | Wt 160.0 lb

## 2021-09-19 DIAGNOSIS — Z86711 Personal history of pulmonary embolism: Secondary | ICD-10-CM | POA: Diagnosis not present

## 2021-09-19 NOTE — Progress Notes (Signed)
Hematology/Oncology Consult note [facility]  [phone] [phone]  Patient Care Team: Adin Hector, MD as PCP - General (Internal Medicine) Clent Jacks, RN as Registered Nurse Hollice Espy, MD as Consulting Physician (Urology) Theodore Demark, RN as Oncology Nurse Navigator Sindy Guadeloupe, MD as Consulting Physician (Hematology and Oncology) Adin Hector, MD as Attending Physician (Family Medicine) Bary Castilla Forest Gleason, MD as Consulting Physician (General Surgery)   Name of the patient: Tara Ramos  [id number]  03-30-1932   Date of visit: 09/19/21  Diagnosis- unprovoked pulmonary embolism  Chief complaint/ Reason for visit-discuss results of blood work  Heme/Onc history: Patient is a 88 year old female with a past medical history significant for breast cancer in 1990s status postlumpectomy and radiation.  She had recurrent disease in 1992 and 2014 as well  in 2021.  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  She is s/p left simple mastectomy in September 2021 and modified radical mastectomy of the right breast at the same time. 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   She presented to the ER on 04/26/2021 with symptoms of worsening shortness of breath and had a CT angio chest which showed acute pulmonary embolism with right heart strain.  Enlarging 19 mm left thyroid nodule and 6 mm right solid pulmonary nodule.  Patient underwent thrombectomy with vascular surgery and was placed on Eliquis.  She has baseline CKD with a creatinine of around 1.4.  She is not currently on any home oxygen.   Results of hypercoagulable work-up showed heterozygosity forFactor V Leiden mutation.  Antiphospholipid antibody panel showed elevated IgM beta 2 glycoprotein level of 42.  Antithrombin III levels were reported to be 0%.  Other hypercoagulable work-up  was negative.repeat anti-thrombin 3 level and beta-2 glycoprotein level 14.  Interval history-patient is doing well on Eliquis and denies any specific complaints at this time  ECOG PS- 1 Pain scale- 0   Review of systems- Review of Systems  Constitutional:  Negative for chills, fever, malaise/fatigue and weight loss.  HENT:  Negative for congestion, ear discharge and nosebleeds.   Eyes:  Negative for blurred vision.  Respiratory:  Negative for cough, hemoptysis, sputum production, shortness of breath and wheezing.   Cardiovascular:  Negative for chest pain, palpitations, orthopnea and claudication.  Gastrointestinal:  Negative for abdominal pain, blood in stool, constipation, diarrhea, heartburn, melena, nausea and vomiting.  Genitourinary:  Negative for dysuria, flank pain, frequency, hematuria and urgency.  Musculoskeletal:  Negative for back pain, joint pain and myalgias.  Skin:  Negative for rash.  Neurological:  Negative for dizziness, tingling, focal weakness, seizures, weakness and headaches.  Endo/Heme/Allergies:  Does not bruise/bleed easily.  Psychiatric/Behavioral:  Negative for depression and suicidal ideas. The patient does not have insomnia.      Allergies  Allergen Reactions   Morphine And Related Nausea And Vomiting   Lisinopril Cough   Tape Other (See Comments)    Blisters underneath; wide white hypofix tape post kidney biopsy      Past Medical History:  Diagnosis Date   Anticoagulant long-term use    Arthritis    Breast cancer ([employer]) 1992   right breast/radiation   Breast cancer ([place]) 1990   left breast/radiation   Breast cancer ([place]) 2014   right breast   Breast cancer, right breast ([place]) 12/16/2012   left, then right, then right  breast cancers (lumpectomies and radiation therapy)   Chronic kidney disease    Dysrhythmia    HX A FIB - FOLLOWED BY [name]   GERD (gastroesophageal reflux disease)    Heart murmur    hx of years ago    Hemorrhoids     Hypertension    Personal history of radiation therapy    1990/1992   Pulmonary emboli [facility])    Renal cell cancer, right ([place]) 11/2014   Right Nephrectomy and Left renal ablation for lesion.    Renal insufficiency    Status post radiation therapy    breast cancer bilateral     Past Surgical History:  Procedure Laterality Date   BREAST BIOPSY Right 03/10/2016   -  STROMAL SCLEROSIS, GIANT CELL REACTION, CHRONIC INFLAMMATION AND FAT    BREAST BIOPSY Right 12/12/2019   [place] bx of mass, vision marker, path pending   BREAST BIOPSY Right 12/12/2019   [place] bx of LN, hydromarker, path pending   BREAST BIOPSY Left 12/12/2019   stereo bx of calcs, coil marker, path pending   BREAST EXCISIONAL BIOPSY Right 2014   +   BREAST EXCISIONAL BIOPSY Right 1992   +   BREAST EXCISIONAL BIOPSY Left 1990   +   BREAST LUMPECTOMY Left 1990   BREAST LUMPECTOMY Right 1992   BROW LIFT Bilateral 11/10/2019   Procedure: BLEPHAROPLASTY UPPER EYELID; W/EXCESS SKIN BILATERAL & ECTROPION REPAIR, EXTENSIVE LEFT LOWER LID;  Surgeon: Karle Starch, MD;  Location: [place];  Service: Ophthalmology;  Laterality: Bilateral;   DILATION AND CURETTAGE OF UTERUS     EYE SURGERY Bilateral    cataract extraction   IR THROMBECT PRIM MECH INIT (INCLU) MOD SED  04/26/2021   IR US GUIDE VASC ACCESS RIGHT  04/26/2021   KYPHOPLASTY N/A 10/09/2014   Procedure: KYPHOPLASTY;  Surgeon: Hessie Knows, MD;  Location: [facility];  Service: Orthopedics;  Laterality: N/A;  T7 Kyphoplasty with bone biopsy   MASTECTOMY MODIFIED RADICAL Right 01/08/2020   Procedure: MASTECTOMY MODIFIED RADICAL;  Surgeon: Robert Bellow, MD;  Location: [facility];  Service: General;  Laterality: Right;   OTHER SURGICAL HISTORY     radiation therapy breast   ROBOT ASSISTED LAPAROSCOPIC NEPHRECTOMY Right 02/01/2015   Procedure: ROBOTIC ASSISTED LAPAROSCOPIC RIGHT NEPHRECTOMY;  Surgeon: Cleon Gustin, MD;  Location: [facility];  Service: Urology;   Laterality: Right;   SIMPLE MASTECTOMY WITH AXILLARY SENTINEL NODE BIOPSY Left 01/08/2020   Procedure: SIMPLE MASTECTOMY;  Surgeon: Robert Bellow, MD;  Location: [facility];  Service: General;  Laterality: Left;    Social History   Socioeconomic History   Marital status: Widowed    Spouse name: Not on file   Number of children: Not on file   Years of education: Not on file   Highest education level: Not on file  Occupational History   Not on file  Tobacco Use   Smoking status: Former    Years: 20.00    Types: Cigarettes    Quit date: 04/27/1972    Years since quitting: 49.4   Smokeless tobacco: Never  Vaping Use   Vaping Use: Never used  Substance and Sexual Activity   Alcohol use: Not Currently    Alcohol/week: 1.0 standard drink    Types: 1 Glasses of wine per week   Drug use: No   Sexual activity: Never  Other Topics Concern   Not on file  Social History Narrative   Not on file  Social Determinants of Health   Financial Resource Strain: Not on file  Food Insecurity: Not on file  Transportation Needs: Not on file  Physical Activity: Not on file  Stress: Not on file  Social Connections: Not on file  Intimate Partner Violence: Not on file    Family History  Problem Relation Age of Onset   Stomach cancer Mother    Stroke Father    Brain cancer Sister    Breast cancer Daughter 52   Bladder Cancer Neg Hx    Prostate cancer Neg Hx    Kidney cancer Neg Hx      Current Outpatient Medications:    amLODipine (NORVASC) 10 MG tablet, Take 10 mg by mouth daily., Disp: , Rfl:    apixaban (ELIQUIS) 5 MG TABS tablet, Take 1 tablet (5 mg total) by mouth 2 (two) times daily., Disp: 60 tablet, Rfl: 3   Ascorbic Acid (VITAMIN C) 1000 MG tablet, Take 1,000 mg by mouth daily. , Disp: , Rfl:    Calcium Carb-Cholecalciferol (CALCIUM + D3) 600-200 MG-UNIT TABS, Take 1 tablet by mouth daily. , Disp: , Rfl:    docusate sodium (COLACE) 100 MG capsule, Take 100 mg by mouth at  bedtime., Disp: , Rfl:    losartan (COZAAR) 100 MG tablet, Take 100 mg by mouth every morning. , Disp: , Rfl:    metoprolol (LOPRESSOR) 100 MG tablet, Take 100 mg by mouth 2 (two) times daily., Disp: , Rfl:    Multiple Vitamin (MULTIVITAMIN WITH MINERALS) TABS tablet, Take 1 tablet by mouth daily., Disp: , Rfl:    spironolactone (ALDACTONE) 25 MG tablet, Take 25 mg by mouth daily., Disp: , Rfl:   Physical exam:  Vitals:   09/19/21 1127  BP: (!) 146/76  Pulse: (!) 56  Resp: 16  Temp: 98.4 F (36.9 C)  TempSrc: Oral  Weight: 160 lb (72.6 kg)  Height: '5\' 2"'$  (1.575 m)   Physical Exam Constitutional:      General: She is not in acute distress. Cardiovascular:     Rate and Rhythm: Normal rate and regular rhythm.     Heart sounds: Normal heart sounds.  Pulmonary:     Effort: Pulmonary effort is normal.     Breath sounds: Normal breath sounds.  Skin:    General: Skin is warm and dry.  Neurological:     Mental Status: She is alert and oriented to person, place, and time.        Latest Ref Rng & Units 09/12/2021   10:33 AM  CMP  Glucose 70 - 99 mg/dL 85    BUN 8 - 23 mg/dL 28    Creatinine 0.44 - 1.00 mg/dL 1.45    Sodium 135 - 145 mmol/L 139    Potassium 3.5 - 5.1 mmol/L 4.7    Chloride 98 - 111 mmol/L 102    CO2 22 - 32 mmol/L 28    Calcium 8.9 - 10.3 mg/dL 9.4    Total Protein 6.5 - 8.1 g/dL 6.5    Total Bilirubin 0.3 - 1.2 mg/dL 0.7    Alkaline Phos 38 - 126 U/L 61    AST 15 - 41 U/L 27    ALT 0 - 44 U/L 31        Latest Ref Rng & Units 09/12/2021   10:33 AM  CBC  WBC 4.0 - 10.5 K/uL 6.8    Hemoglobin 12.0 - 15.0 g/dL 16.5    Hematocrit 36.0 - 46.0 % 50.6  Platelets 150 - 400 K/uL 255      No images are attached to the encounter.  No results found.   Assessment and plan- Patient is a 88 y.o. female with history of unprovoked pulmonary embolism here to discuss results of blood work  Hypercoagulable blood work including antiphospholipid antibody panel has  been negative.  Cause of her unprovoked PE is unclear but I would recommend that she should stay on Eliquis indefinitely as long as there is no increased risk of bleeding.  Given her age if there is any concern for falls in the future or worsening kidney disease consideration may be given to hold anticoagulation at that time   Visit Diagnosis 1. History of pulmonary embolism      Dr. Randa Evens, MD, MPH [facility] [phone] 09/19/2021 4:36 PM

## 2021-12-03 IMAGING — US US BREAST*R* LIMITED INC AXILLA
1 series · 13 of 24 positions shown · non-contrast
Comparison: Previous exam(s).

CLINICAL DATA: History of LEFT lumpectomy in 4559. Patient had
RIGHT lumpectomy with radiation therapy in 6115. [place]. In 8170, patient had recurrence
of RIGHT breast cancer, treated with lumpectomy in [HOSPITAL]. The
patient has been recalled for further evaluation of a possible mass,
distortion, and calcifications in the RIGHT breast and
calcifications in the LEFT breast.

EXAM:
DIGITAL DIAGNOSTIC BILATERAL MAMMOGRAM WITH CAD AND TOMO
ULTRASOUND RIGHT BREAST

[Series 1: us breast*right* limited inc axilla · 0.07mm/px · 13 of 24 slices shown]
[im 1/24]
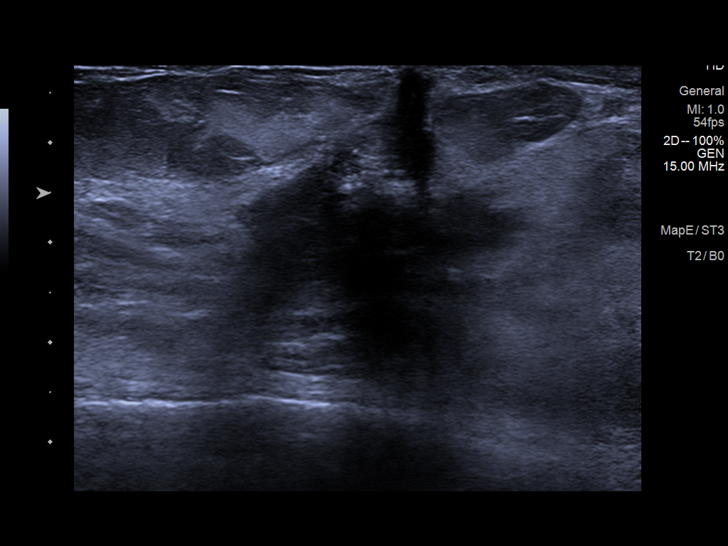
[im 3/24]
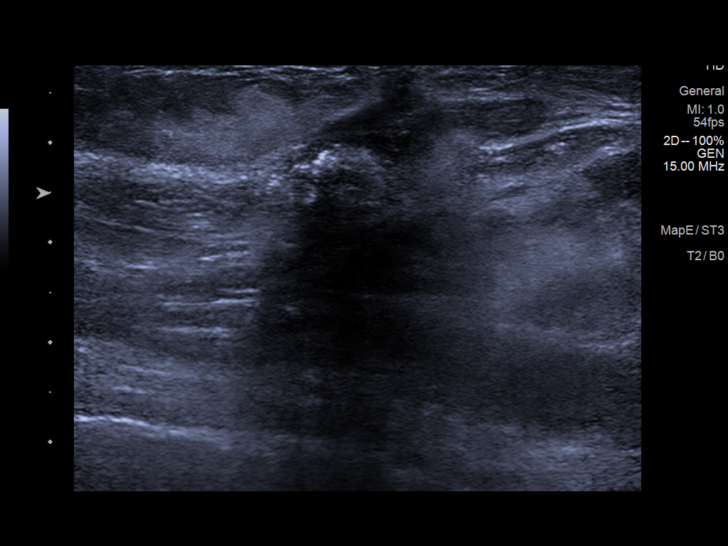
[im 5/24]
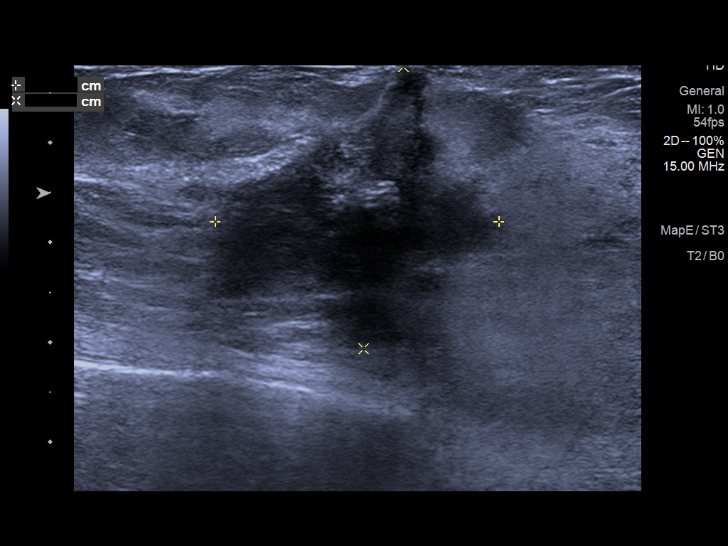
[im 7/24]
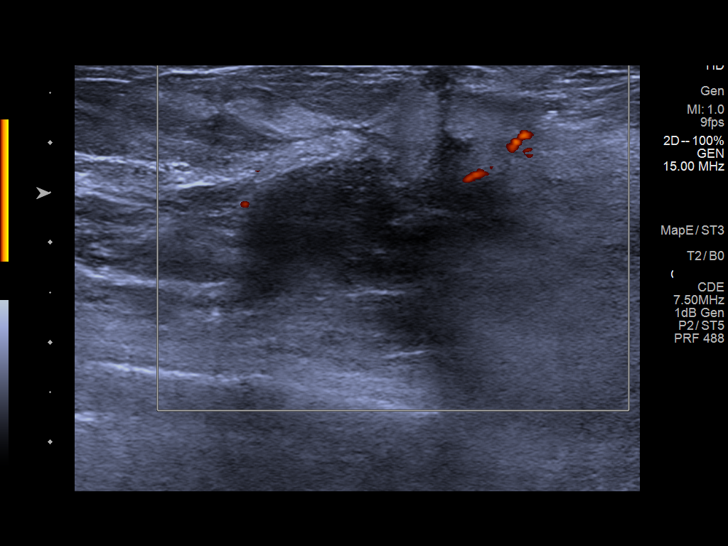
[im 9/24]
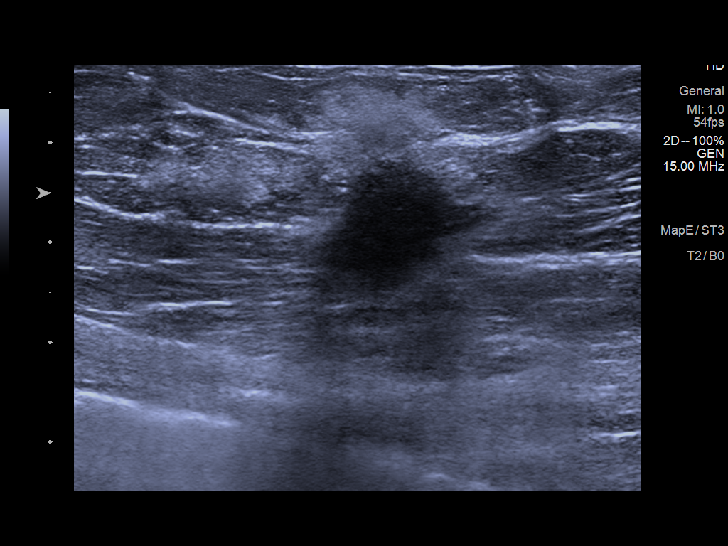
[im 11/24]
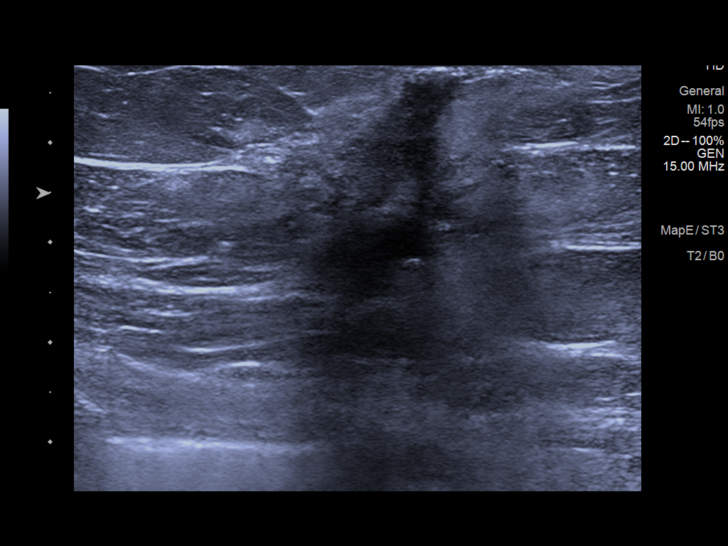
[im 13/24]
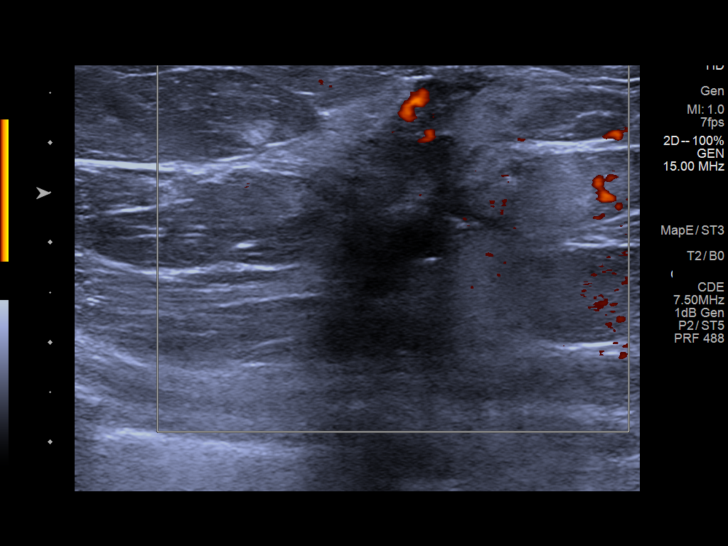
[im 14/24]
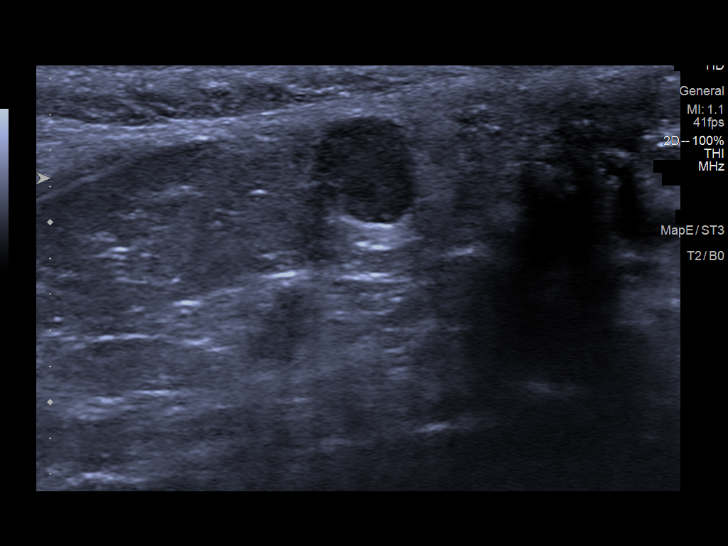
[im 16/24]
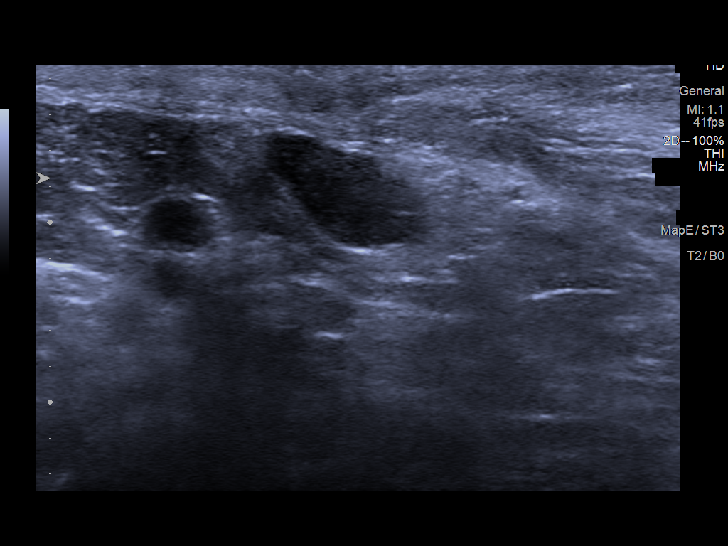
[im 18/24]
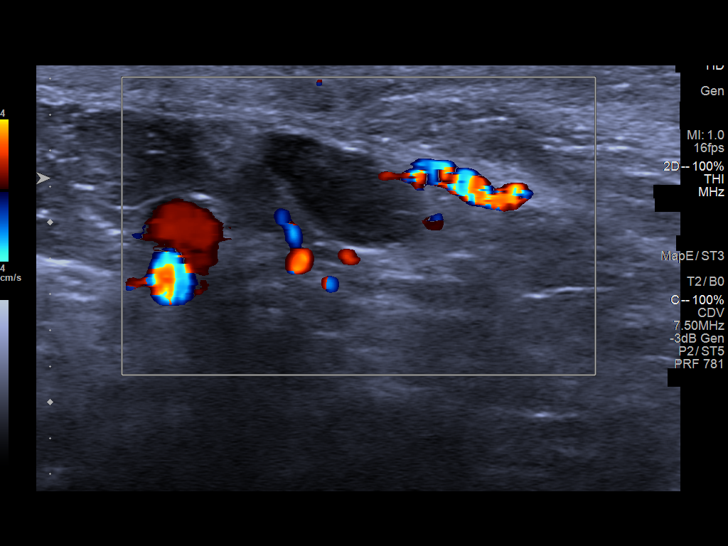
[im 20/24]
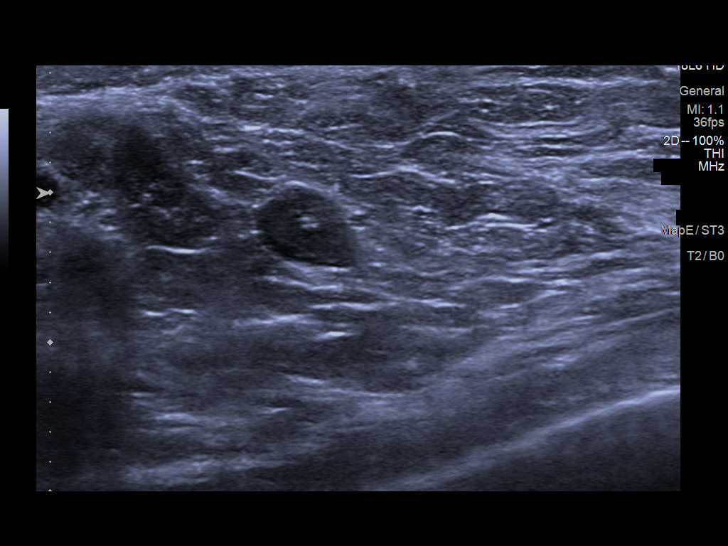
[im 22/24]
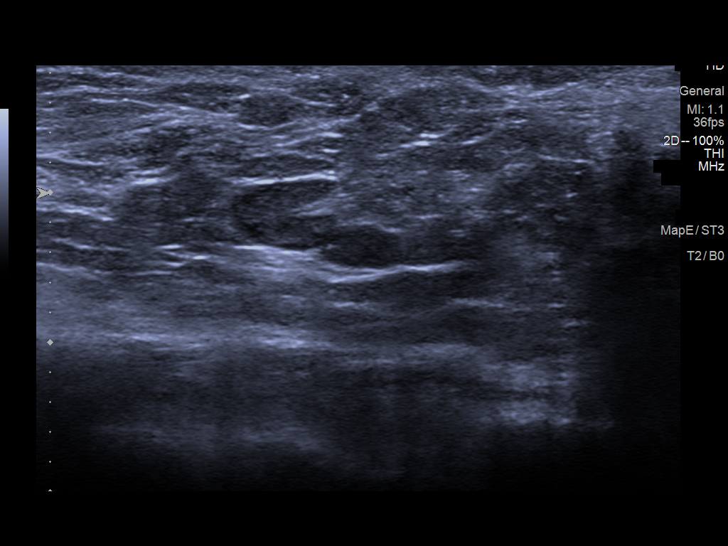
[im 24/24]
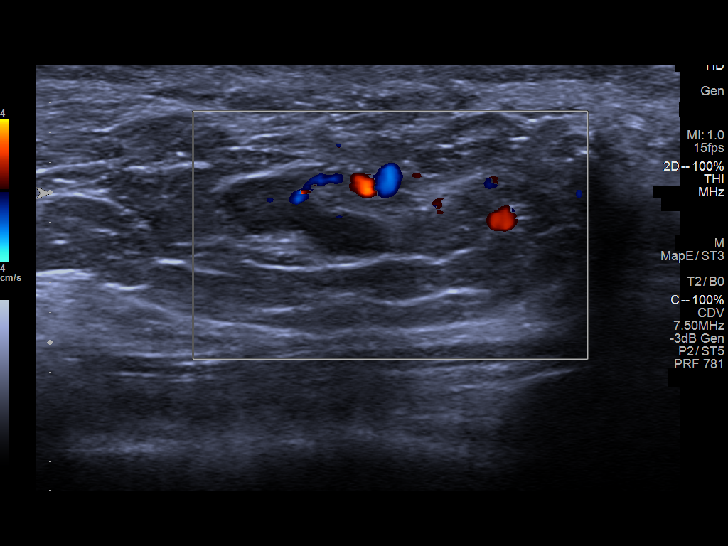

[13 of 24 positions shown; findings below may reference images not displayed]

ACR Breast Density Category b: There are scattered areas of
fibroglandular density.
FINDINGS: RIGHT BREAST:

Mammogram: Additional 2-D and 3-D images are performed. These views
confirm presence of mass associated with distortion in the
lumpectomy scar in the UPPER-OUTER QUADRANT of the RIGHT breast.
There are additional coarse dystrophic appearing calcifications.
Within the central aspect of the coarse calcifications, a tissue
marker clip is present. Mammographic images were processed with CAD.

Physical Exam: I palpate firm mass in the scar along the LATERAL
portion of the RIGHT breast.

Ultrasound: Targeted ultrasound is performed, showing irregular
hypoechoic mass with irregular margins contiguous with the scar in
the 9 o'clock location RIGHT breast 6 centimeters from the nipple
measuring 2.8 x 2.9 by 1.5 centimeters. Internal blood flow is
identified on Doppler evaluation.

Evaluation of the RIGHT axilla shows 2 adjacent lymph nodes with
thickened cortex.

LEFT BREAST:

Mammogram: Magnified views are performed of calcifications in the
LOWER central portion of the LEFT breast. These views demonstrate a
group of fine pleomorphic calcifications in a linear distribution,
measuring 6 millimeters maximum diameter. Mammographic images were
processed with CAD.
IMPRESSION: 1. Suspicious mass at the RIGHT lumpectomy site, warranting tissue
diagnosis.
2. Two enlarged RIGHT axillary lymph nodes, warranting tissue
diagnosis.
3. Indeterminate calcifications in linear distribution in the LEFT
breast, warranting tissue diagnosis.

RECOMMENDATION:
Ultrasound-guided core biopsy of mass in the 9 o'clock location of
the RIGHT breast.

Ultrasound-guided core biopsy of enlarged RIGHT axillary lymph node.

Stereotactic guided core biopsy of LEFT breast calcifications.

I have discussed the findings and recommendations with the patient.
If applicable, a reminder letter will be sent to the patient
regarding the next appointment.

BI-RADS CATEGORY  5: Highly suggestive of malignancy.

## 2021-12-08 DIAGNOSIS — E785 Hyperlipidemia, unspecified: Secondary | ICD-10-CM | POA: Diagnosis not present

## 2021-12-08 DIAGNOSIS — N1832 Chronic kidney disease, stage 3b: Secondary | ICD-10-CM | POA: Diagnosis not present

## 2021-12-08 DIAGNOSIS — I10 Essential (primary) hypertension: Secondary | ICD-10-CM | POA: Diagnosis not present

## 2021-12-15 DIAGNOSIS — Z853 Personal history of malignant neoplasm of breast: Secondary | ICD-10-CM | POA: Diagnosis not present

## 2021-12-15 DIAGNOSIS — I2510 Atherosclerotic heart disease of native coronary artery without angina pectoris: Secondary | ICD-10-CM | POA: Diagnosis not present

## 2021-12-15 DIAGNOSIS — Z86711 Personal history of pulmonary embolism: Secondary | ICD-10-CM | POA: Diagnosis not present

## 2021-12-15 DIAGNOSIS — I10 Essential (primary) hypertension: Secondary | ICD-10-CM | POA: Diagnosis not present

## 2021-12-15 DIAGNOSIS — I700 Atherosclerosis of aorta: Secondary | ICD-10-CM | POA: Diagnosis not present

## 2021-12-15 DIAGNOSIS — I480 Paroxysmal atrial fibrillation: Secondary | ICD-10-CM | POA: Diagnosis not present

## 2021-12-15 DIAGNOSIS — D6851 Activated protein C resistance: Secondary | ICD-10-CM | POA: Diagnosis not present

## 2021-12-15 DIAGNOSIS — Z85528 Personal history of other malignant neoplasm of kidney: Secondary | ICD-10-CM | POA: Diagnosis not present

## 2021-12-15 DIAGNOSIS — M791 Myalgia, unspecified site: Secondary | ICD-10-CM | POA: Diagnosis not present

## 2021-12-15 DIAGNOSIS — D6869 Other thrombophilia: Secondary | ICD-10-CM | POA: Diagnosis not present

## 2021-12-15 DIAGNOSIS — N1832 Chronic kidney disease, stage 3b: Secondary | ICD-10-CM | POA: Diagnosis not present

## 2022-01-15 DIAGNOSIS — Z86711 Personal history of pulmonary embolism: Secondary | ICD-10-CM | POA: Diagnosis not present

## 2022-01-15 DIAGNOSIS — Z171 Estrogen receptor negative status [ER-]: Secondary | ICD-10-CM | POA: Diagnosis not present

## 2022-01-15 DIAGNOSIS — C50411 Malignant neoplasm of upper-outer quadrant of right female breast: Secondary | ICD-10-CM | POA: Diagnosis not present

## 2022-01-15 DIAGNOSIS — I10 Essential (primary) hypertension: Secondary | ICD-10-CM | POA: Diagnosis not present

## 2022-01-15 DIAGNOSIS — I2510 Atherosclerotic heart disease of native coronary artery without angina pectoris: Secondary | ICD-10-CM | POA: Diagnosis not present

## 2022-01-19 DIAGNOSIS — M810 Age-related osteoporosis without current pathological fracture: Secondary | ICD-10-CM | POA: Diagnosis not present

## 2022-01-19 DIAGNOSIS — I2510 Atherosclerotic heart disease of native coronary artery without angina pectoris: Secondary | ICD-10-CM | POA: Diagnosis not present

## 2022-01-19 DIAGNOSIS — M791 Myalgia, unspecified site: Secondary | ICD-10-CM | POA: Diagnosis not present

## 2022-01-19 DIAGNOSIS — I10 Essential (primary) hypertension: Secondary | ICD-10-CM | POA: Diagnosis not present

## 2022-01-19 DIAGNOSIS — Z85528 Personal history of other malignant neoplasm of kidney: Secondary | ICD-10-CM | POA: Diagnosis not present

## 2022-01-19 DIAGNOSIS — I480 Paroxysmal atrial fibrillation: Secondary | ICD-10-CM | POA: Diagnosis not present

## 2022-01-19 DIAGNOSIS — I700 Atherosclerosis of aorta: Secondary | ICD-10-CM | POA: Diagnosis not present

## 2022-01-19 DIAGNOSIS — Z86711 Personal history of pulmonary embolism: Secondary | ICD-10-CM | POA: Diagnosis not present

## 2022-01-19 DIAGNOSIS — N1832 Chronic kidney disease, stage 3b: Secondary | ICD-10-CM | POA: Diagnosis not present

## 2022-01-19 DIAGNOSIS — D6869 Other thrombophilia: Secondary | ICD-10-CM | POA: Diagnosis not present

## 2022-01-19 DIAGNOSIS — D6851 Activated protein C resistance: Secondary | ICD-10-CM | POA: Diagnosis not present

## 2022-01-19 DIAGNOSIS — E7849 Other hyperlipidemia: Secondary | ICD-10-CM | POA: Diagnosis not present

## 2022-01-29 DIAGNOSIS — H3530 Unspecified macular degeneration: Secondary | ICD-10-CM | POA: Diagnosis not present

## 2022-02-19 NOTE — Progress Notes (Signed)
02/20/2022 10:30 AM   Tara Ramos January 12, 1932 [id number]  Referring provider: Adin Hector, MD [place] [facility] [place],  [place] [address]  Urological history: 1. Renal cell carcinoma -R robotic nephrectomy per Dr. Alyson Ingles on 02/01/2015.  Pathology was consistent with clear cell RCC Fuhrman grade 4, measuring 4 cm of the right upper pole -underwent successful CT-guided microwave ablation of her left renal lesion on 05/24/2015  Chief Complaint  Patient presents with   Follow-up    HPI: Tara Ramos is an 88 y.o. female with bilateral renal masses s/p right robotic nephrectomy in [place] by Dr. Alyson Ingles in 02/01/15 and CT guided microwave ablation of the left renal mass 05/24/15.  She returns today for routine annual surveillance with imaging.  She was last seen 1 year ago.  Non-contrast CT (05/2021) - Status post right nephrectomy. No unexpected or suspicious soft tissue in the right nephrectomy bed. Ablation defect again noted upper pole left kidney, stable.   Serum creatinine 1.4 (12/2021)   She has nocturia x2.  Patient denies any modifying or aggravating factors.  Patient denies any gross hematuria, dysuria or suprapubic/flank pain.  Patient denies any fevers, chills, nausea or vomiting.     PMH: Past Medical History:  Diagnosis Date   Anticoagulant long-term use    Arthritis    Breast cancer ([facility]) 1992   right breast/radiation   Breast cancer ([facility]) 1990   left breast/radiation   Breast cancer ([facility]) 2014   right breast   Breast cancer, right breast ([facility]) 12/16/2012   left, then right, then right breast cancers (lumpectomies and radiation therapy)   Chronic kidney disease    Dysrhythmia    HX A FIB - FOLLOWED BY [name]   GERD (gastroesophageal reflux disease)    Heart murmur    hx of years ago    Hemorrhoids    Hypertension    Personal history of radiation therapy    1990/1992   Pulmonary emboli ([facility])    Renal cell cancer, right  ([facility]) 11/2014   Right Nephrectomy and Left renal ablation for lesion.    Renal insufficiency    Status post radiation therapy    breast cancer bilateral    Surgical History: Past Surgical History:  Procedure Laterality Date   BREAST BIOPSY Right 03/10/2016   -  STROMAL SCLEROSIS, GIANT CELL REACTION, CHRONIC INFLAMMATION AND FAT    BREAST BIOPSY Right 12/12/2019   [place] bx of mass, vision marker, path pending   BREAST BIOPSY Right 12/12/2019   [place] bx of LN, hydromarker, path pending   BREAST BIOPSY Left 12/12/2019   stereo bx of calcs, coil marker, path pending   BREAST EXCISIONAL BIOPSY Right 2014   +   BREAST EXCISIONAL BIOPSY Right 1992   +   BREAST EXCISIONAL BIOPSY Left 1990   +   BREAST LUMPECTOMY Left 1990   BREAST LUMPECTOMY Right 1992   BROW LIFT Bilateral 11/10/2019   Procedure: BLEPHAROPLASTY UPPER EYELID; W/EXCESS SKIN BILATERAL & ECTROPION REPAIR, EXTENSIVE LEFT LOWER LID;  Surgeon: Karle Starch, MD;  Location: [facility];  Service: Ophthalmology;  Laterality: Bilateral;   DILATION AND CURETTAGE OF UTERUS     EYE SURGERY Bilateral    cataract extraction   IR THROMBECT PRIM MECH INIT (INCLU) MOD SED  04/26/2021   IR US GUIDE VASC ACCESS RIGHT  04/26/2021   KYPHOPLASTY N/A 10/09/2014   Procedure: KYPHOPLASTY;  Surgeon: Hessie Knows, MD;  Location:  [facility];  Service: Orthopedics;  Laterality: N/A;  T7 Kyphoplasty with bone biopsy   MASTECTOMY MODIFIED RADICAL Right 01/08/2020   Procedure: MASTECTOMY MODIFIED RADICAL;  Surgeon: Robert Bellow, MD;  Location: [facility];  Service: General;  Laterality: Right;   OTHER SURGICAL HISTORY     radiation therapy breast   ROBOT ASSISTED LAPAROSCOPIC NEPHRECTOMY Right 02/01/2015   Procedure: ROBOTIC ASSISTED LAPAROSCOPIC RIGHT NEPHRECTOMY;  Surgeon: Cleon Gustin, MD;  Location: [facility];  Service: Urology;  Laterality: Right;   SIMPLE MASTECTOMY WITH AXILLARY SENTINEL NODE BIOPSY Left 01/08/2020   Procedure: SIMPLE  MASTECTOMY;  Surgeon: Robert Bellow, MD;  Location: [facility];  Service: General;  Laterality: Left;    Home Medications:  Allergies as of 02/20/2022       Reactions   Morphine And Related Nausea And Vomiting   Lisinopril Cough   Tape Other (See Comments)   Blisters underneath; wide white hypofix tape post kidney biopsy         Medication List        Accurate as of February 20, 2022 10:30 AM. If you have any questions, ask your nurse or doctor.          STOP taking these medications    spironolactone 25 MG tablet Commonly known as: ALDACTONE Stopped by: Trejuan Matherne, PA-C       TAKE these medications    amLODipine 10 MG tablet Commonly known as: NORVASC Take 10 mg by mouth daily.   apixaban 5 MG Tabs tablet Commonly known as: ELIQUIS Take 1 tablet (5 mg total) by mouth 2 (two) times daily.   Calcium + D3 600-200 MG-UNIT Tabs Take 1 tablet by mouth daily.   docusate sodium 100 MG capsule Commonly known as: COLACE Take 100 mg by mouth at bedtime.   losartan 100 MG tablet Commonly known as: COZAAR Take 100 mg by mouth every morning.   metoprolol tartrate 100 MG tablet Commonly known as: LOPRESSOR Take 100 mg by mouth 2 (two) times daily.   multivitamin with minerals Tabs tablet Take 1 tablet by mouth daily.   vitamin C 1000 MG tablet Take 1,000 mg by mouth daily.        Allergies:  Allergies  Allergen Reactions   Morphine And Related Nausea And Vomiting   Lisinopril Cough   Tape Other (See Comments)    Blisters underneath; wide white hypofix tape post kidney biopsy     Family History: Family History  Problem Relation Age of Onset   Stomach cancer Mother    Stroke Father    Brain cancer Sister    Breast cancer Daughter 87   Bladder Cancer Neg Hx    Prostate cancer Neg Hx    Kidney cancer Neg Hx     Social History:  reports that she quit smoking about 49 years ago. Her smoking use included cigarettes. She has never used  smokeless tobacco. She reports that she does not currently use alcohol after a past usage of about 1.0 standard drink of alcohol per week. She reports that she does not use drugs.  ROS: For pertinent review of systems please refer to history of present illness  Physical Exam: BP 133/73   Pulse 75   Ht 5' 2"  (1.575 m)   Wt 168 lb (76.2 kg)   BMI 30.73 kg/m   Constitutional:  Well nourished. Alert and oriented, No acute distress. HEENT: [place] AT, moist mucus membranes.  Trachea midline Cardiovascular: No clubbing, cyanosis, or  edema. Respiratory: Normal respiratory effort, no increased work of breathing. Neurologic: Grossly intact, no focal deficits, moving all 4 extremities. Psychiatric: Normal mood and affect.    Laboratory data Glucose 70 - 110 mg/dL 92   Sodium 136 - 145 mmol/L 143   Potassium 3.6 - 5.1 mmol/L 4.9   Chloride 97 - 109 mmol/L 105   Carbon Dioxide (CO2) 22.0 - 32.0 mmol/L 31.7   Calcium 8.7 - 10.3 mg/dL 9.9   Urea Nitrogen (BUN) 7 - 25 mg/dL 23   Creatinine 0.6 - 1.1 mg/dL 1.4 High    Glomerular Filtration Rate (eGFR) >60 mL/min/1.73sq m 36 Low    Comment: CKD-EPI (2021) does not include patient's race in the calculation of eGFR.  Monitoring changes of plasma creatinine and eGFR over time is useful for monitoring kidney function.   Interpretive Ranges for eGFR (CKD-EPI 2021):   eGFR:       >60 mL/min/1.73 sq. m - Normal  eGFR:       30-59 mL/min/1.73 sq. m - Moderately Decreased  eGFR:       15-29 mL/min/1.73 sq. m  - Severely Decreased  eGFR:       < 15 mL/min/1.73 sq. m  - Kidney Failure    Note: These eGFR calculations do not apply in acute situations when eGFR is changing rapidly or patients on dialysis.  BUN/Crea Ratio 6.0 - 20.0 16.4   Anion Gap w/K 6.0 - 16.0 11.2   Resulting Agency  [facility] - [facility]   Specimen Collected: 01/15/22 09:24   Performed by: [place]: 01/15/22 13:41  Received From: [facility]  Result Received: 01/21/22 15:20   Color Colorless, Straw, Light Yellow, Yellow, Dark Yellow Light Yellow   Clarity Clear Clear   Specific Gravity 1.005 - 1.030 1.012   pH, Urine 5.0 - 8.0 6.0   Protein, Urinalysis Negative mg/dL Negative   Glucose, Urinalysis Negative mg/dL Negative   Ketones, Urinalysis Negative mg/dL Negative   Blood, Urinalysis Negative Negative   Nitrite, Urinalysis Negative Negative   Leukocyte Esterase, Urinalysis Negative Negative   Bilirubin, Urinalysis Negative Negative   Urobilinogen, Urinalysis 0.2 - 1.0 mg/dL 0.2   WBC, UA <=5 /hpf 1   Red Blood Cells, Urinalysis <=3 /hpf 0   Bacteria, Urinalysis 0 - 5 /hpf 0-5   Squamous Epithelial Cells, Urinalysis /hpf 2   Resulting Agency  [facility] - [facility]   Specimen Collected: 01/15/22 09:24   Performed by: [facility] Last Resulted: 01/15/22 12:59  Received From: [facility]  Result Received: 02/19/22 10:25   WBC (White Blood Cell Count) 4.1 - 10.2 10^3/uL 7.3   RBC (Red Blood Cell Count) 4.04 - 5.48 10^6/uL 5.31   Hemoglobin 12.0 - 15.0 gm/dL 16.1 High    Hematocrit 35.0 - 47.0 % 50.4 High    MCV (Mean Corpuscular Volume) 80.0 - 100.0 fl 94.9   MCH (Mean Corpuscular Hemoglobin) 27.0 - 31.2 pg 30.3   MCHC (Mean Corpuscular Hemoglobin Concentration) 32.0 - 36.0 gm/dL 31.9 Low    Platelet Count 150 - 450 10^3/uL 242   RDW-CV (Red Cell Distribution Width) 11.6 - 14.8 % 13.9   MPV (Mean Platelet Volume) 9.4 - 12.4 fl 9.3 Low    Neutrophils 1.50 - 7.80 10^3/uL 5.56   Lymphocytes 1.00 - 3.60 10^3/uL 0.86 Low    Monocytes 0.00 - 1.50 10^3/uL 0.51   Eosinophils 0.00 - 0.55  10^3/uL 0.25   Basophils 0.00 - 0.09 10^3/uL 0.06   Neutrophil % 32.0 - 70.0 % 76.7 High    Lymphocyte % 10.0 - 50.0 % 11.8   Monocyte % 4.0 - 13.0 % 7.0   Eosinophil % 1.0 - 5.0 % 3.4   Basophil% 0.0 - 2.0 % 0.8   Immature Granulocyte % <=0.7 % 0.3   Immature Granulocyte Count <=0.06  10^3/L 0.02   Resulting Agency  [facility] - [facility]   Specimen Collected: 01/15/22 09:24   Performed by: [place]: 01/15/22 09:41  Received From: [facility]  Result Received: 01/15/22 09:45  I have reviewed the labs.   Pertinent Imaging: Narrative & Impression  CLINICAL DATA:  Renal cell and breast cancer.  Restaging.   EXAM: CT CHEST, ABDOMEN AND PELVIS WITHOUT CONTRAST   TECHNIQUE: Multidetector CT imaging of the chest, abdomen and pelvis was performed following the standard protocol without IV contrast.   RADIATION DOSE REDUCTION: This exam was performed according to the departmental dose-optimization program which includes automated exposure control, adjustment of the mA and/or kV according to patient size and/or use of iterative reconstruction technique.   COMPARISON:  CTA chest 04/26/2021.  Abdomen/pelvis CT 02/04/2018   FINDINGS: CT CHEST FINDINGS   Cardiovascular: The heart size is normal. No substantial pericardial effusion. Coronary artery calcification is evident. Moderate atherosclerotic calcification is noted in the wall of the thoracic aorta. Ascending thoracic aorta measures 4.2 cm diameter   Mediastinum/Nodes: 1.7 cm left thyroid nodule. No mediastinal lymphadenopathy. No evidence for gross hilar lymphadenopathy although assessment is limited by the lack of intravenous contrast on the current study. The esophagus has normal imaging features. There is no axillary lymphadenopathy.   Lungs/Pleura: Stable 6-7 mm anterior right upper lobe nodule on 47/4. No new suspicious pulmonary nodule or mass. No focal airspace consolidation. No pleural effusion.   Musculoskeletal: No worrisome lytic or sclerotic osseous abnormality.   CT ABDOMEN PELVIS FINDINGS   Hepatobiliary: No suspicious focal abnormality in the liver on this study without intravenous contrast. Hypervascular lesion seen on the 2019 CT is  not visible on today's noncontrast exam. Mural calcification the gallbladder fundus is stable, presumably related to adenomyomatosis. No intrahepatic or extrahepatic biliary dilation.   Pancreas: No focal mass lesion. No dilatation of the main duct. No intraparenchymal cyst. No peripancreatic edema.   Spleen: No splenomegaly. No focal mass lesion.   Adrenals/Urinary Tract: Mild thickening of both adrenal glands is similar to prior. Right kidney surgically absent. No unexpected or suspicious soft tissue in the right nephrectomy bed. Apparent ablation defect again noted upper pole left kidney. Similar appearance exophytic cyst upper pole left kidney with another smaller cyst in the anterior lower pole similar to prior. Insert normal ureter The urinary bladder appears normal for the degree of distention.   Stomach/Bowel: Stomach is unremarkable. No gastric wall thickening. No evidence of outlet obstruction. Duodenum is normally positioned as is the ligament of Treitz. No small bowel wall thickening. No small bowel dilatation. The terminal ileum is normal. The appendix is normal. No gross colonic mass. No colonic wall thickening. Diverticular changes are noted in the left colon without evidence of diverticulitis.   Vascular/Lymphatic: There is moderate atherosclerotic calcification of the abdominal aorta without aneurysm. There is no gastrohepatic or hepatoduodenal ligament lymphadenopathy. No retroperitoneal or mesenteric lymphadenopathy. No pelvic sidewall lymphadenopathy.   Reproductive: There is no adnexal mass.   Other: No intraperitoneal free fluid.  Musculoskeletal: No worrisome lytic or sclerotic osseous abnormality. Vertebral augmentation noted midthoracic vertebral body.   IMPRESSION: 1. Stable exam. No new or progressive findings in the chest, abdomen, or pelvis. 2. Status post right nephrectomy. No unexpected or suspicious soft tissue in the right nephrectomy bed.  Ablation defect again noted upper pole left kidney, stable. 3. 1.7 cm left thyroid nodule. Recommend thyroid US (ref: J Am Coll Radiol. 2015 Feb;12(2): 143-50). 4. Stable 6-7 mm anterior right upper lobe pulmonary nodule. Non-contrast chest CT at 6-12 months is recommended. If the nodule is stable at time of repeat CT, then future CT at 18-24 months (from today's scan) is considered optional for low-risk patients, but is recommended for high-risk patients. This recommendation follows the consensus statement: Guidelines for Management of Incidental Pulmonary Nodules Detected on CT Images: From the Fleischner Society 2017; Radiology 2017; 284:228-243. 5. Aortic Atherosclerosis (ICD10-I70.0).     Electronically Signed   By: Misty Stanley M.D.   On: 06/14/2021 13:33  I have independently reviewed the films.  See HPI.   Assessment & Plan:    1. Renal cell cancer -cancer history as above -Discussed findings on the CT from February -NED on recent CT -continue to follow up with yearly ultrasound  2. CKD -creatinine stable   3.  Nocturia -She does not find it bothersome at this time  Return for February 2024 for R [place] report.  Ott Zimmerle, [place] [address], [place], [place] [address] [phone]

## 2022-02-20 ENCOUNTER — Encounter: Payer: Self-pay | Admitting: Urology

## 2022-02-20 ENCOUNTER — Ambulatory Visit: Payer: PPO | Admitting: Urology

## 2022-02-20 VITALS — BP 133/73 | HR 75 | Ht 62.0 in | Wt 168.0 lb

## 2022-02-20 DIAGNOSIS — Z85528 Personal history of other malignant neoplasm of kidney: Secondary | ICD-10-CM | POA: Diagnosis not present

## 2022-02-20 DIAGNOSIS — C649 Malignant neoplasm of unspecified kidney, except renal pelvis: Secondary | ICD-10-CM

## 2022-02-20 DIAGNOSIS — N189 Chronic kidney disease, unspecified: Secondary | ICD-10-CM | POA: Diagnosis not present

## 2022-05-01 DIAGNOSIS — I2510 Atherosclerotic heart disease of native coronary artery without angina pectoris: Secondary | ICD-10-CM | POA: Diagnosis not present

## 2022-05-01 DIAGNOSIS — D6869 Other thrombophilia: Secondary | ICD-10-CM | POA: Diagnosis not present

## 2022-05-01 DIAGNOSIS — E042 Nontoxic multinodular goiter: Secondary | ICD-10-CM | POA: Diagnosis not present

## 2022-05-01 DIAGNOSIS — I83019 Varicose veins of right lower extremity with ulcer of unspecified site: Secondary | ICD-10-CM | POA: Diagnosis not present

## 2022-05-01 DIAGNOSIS — Z86711 Personal history of pulmonary embolism: Secondary | ICD-10-CM | POA: Diagnosis not present

## 2022-05-01 DIAGNOSIS — N1832 Chronic kidney disease, stage 3b: Secondary | ICD-10-CM | POA: Diagnosis not present

## 2022-05-01 DIAGNOSIS — L97919 Non-pressure chronic ulcer of unspecified part of right lower leg with unspecified severity: Secondary | ICD-10-CM | POA: Diagnosis not present

## 2022-05-01 DIAGNOSIS — I480 Paroxysmal atrial fibrillation: Secondary | ICD-10-CM | POA: Diagnosis not present

## 2022-05-01 DIAGNOSIS — D6851 Activated protein C resistance: Secondary | ICD-10-CM | POA: Diagnosis not present

## 2022-05-01 DIAGNOSIS — I700 Atherosclerosis of aorta: Secondary | ICD-10-CM | POA: Diagnosis not present

## 2022-05-21 ENCOUNTER — Encounter: Payer: PPO | Attending: Physician Assistant | Admitting: Internal Medicine

## 2022-05-21 DIAGNOSIS — I129 Hypertensive chronic kidney disease with stage 1 through stage 4 chronic kidney disease, or unspecified chronic kidney disease: Secondary | ICD-10-CM | POA: Insufficient documentation

## 2022-05-21 DIAGNOSIS — N1832 Chronic kidney disease, stage 3b: Secondary | ICD-10-CM | POA: Diagnosis not present

## 2022-05-21 DIAGNOSIS — I2510 Atherosclerotic heart disease of native coronary artery without angina pectoris: Secondary | ICD-10-CM | POA: Insufficient documentation

## 2022-05-21 DIAGNOSIS — L97812 Non-pressure chronic ulcer of other part of right lower leg with fat layer exposed: Secondary | ICD-10-CM | POA: Diagnosis not present

## 2022-05-21 DIAGNOSIS — I890 Lymphedema, not elsewhere classified: Secondary | ICD-10-CM | POA: Insufficient documentation

## 2022-05-21 DIAGNOSIS — I87311 Chronic venous hypertension (idiopathic) with ulcer of right lower extremity: Secondary | ICD-10-CM | POA: Diagnosis not present

## 2022-05-21 DIAGNOSIS — E785 Hyperlipidemia, unspecified: Secondary | ICD-10-CM | POA: Insufficient documentation

## 2022-05-21 DIAGNOSIS — D6851 Activated protein C resistance: Secondary | ICD-10-CM | POA: Insufficient documentation

## 2022-05-21 DIAGNOSIS — Z905 Acquired absence of kidney: Secondary | ICD-10-CM | POA: Diagnosis not present

## 2022-05-21 DIAGNOSIS — I70235 Atherosclerosis of native arteries of right leg with ulceration of other part of foot: Secondary | ICD-10-CM | POA: Diagnosis not present

## 2022-05-21 DIAGNOSIS — E042 Nontoxic multinodular goiter: Secondary | ICD-10-CM | POA: Insufficient documentation

## 2022-05-21 DIAGNOSIS — K219 Gastro-esophageal reflux disease without esophagitis: Secondary | ICD-10-CM | POA: Insufficient documentation

## 2022-05-21 DIAGNOSIS — Z85528 Personal history of other malignant neoplasm of kidney: Secondary | ICD-10-CM | POA: Insufficient documentation

## 2022-05-21 DIAGNOSIS — Z853 Personal history of malignant neoplasm of breast: Secondary | ICD-10-CM | POA: Diagnosis not present

## 2022-05-21 DIAGNOSIS — L97818 Non-pressure chronic ulcer of other part of right lower leg with other specified severity: Secondary | ICD-10-CM | POA: Insufficient documentation

## 2022-05-21 DIAGNOSIS — Z7901 Long term (current) use of anticoagulants: Secondary | ICD-10-CM | POA: Diagnosis not present

## 2022-05-21 DIAGNOSIS — Z923 Personal history of irradiation: Secondary | ICD-10-CM | POA: Diagnosis not present

## 2022-05-21 DIAGNOSIS — Z86711 Personal history of pulmonary embolism: Secondary | ICD-10-CM | POA: Diagnosis not present

## 2022-05-21 NOTE — Progress Notes (Signed)
ACCALIA, RIGDON ([id number]) [id number] Page 1 of 7 Visit Report for 05/21/2022 Chief Complaint Document Details Patient Name: Date of Service: Tara Ramos, [place] 05/21/2022 10:30 A M Medical Record Number: [id number] Patient Account Number: [id number] Date of Birth/Sex: Treating RN: Jan 23, 1932 (89 y.o. Tara Ramos Primary Care Provider: Ramonita Ramos Other Clinician: Referring Provider: Treating Provider/Extender: Tara BSO Tara Ramos, Tara Ramos: 0 Information Obtained from: Patient Chief Complaint 05/21/2022; patient is here for review of 2 wounds on the right anterior lower leg which have healed since the appointment was initially made Electronic Signature(s) Signed: 05/21/2022 4:38:56 PM By: Tara Ham MD Entered By: Tara Ramos on 05/21/2022 12:20:40 -------------------------------------------------------------------------------- HPI Details Patient Name: Date of Service: Tara Heck, MA Tara N J. 05/21/2022 10:30 A M Medical Record Number: [id number] Patient Account Number: [id number] Date of Birth/Sex: Treating RN: May 17, 1931 (89 y.o. Tara Ramos Primary Care Provider: Ramonita Ramos Other Clinician: Referring Provider: Treating Provider/Extender: Tara Ramos, Tara Ramos: 0 History of Present Illness HPI Description: ADMISSION 05/21/2022 This is an 89 year old woman who was seen by her primary doctor earlier this month noted to have wounds on her medial right lower extremity and sent here. Also noted to have bilateral lower extremity edema/lymphedema. Was recommended she use Polysporin or hydrogen peroxide as well as Vaseline gauze. I think she was also put on antibiotics but I could not really determine which one. In any case since the patient saw her primary physician 3 weeks ago the wounds have healed although she still has bilateral edema. Patient's past medical history is extensive including  history of renal cell CA status post right nephrectomy, right breast cancer, hypertension, chronic kidney disease stage IIIb history of a massive pulmonary embolism with embolectomy in December 2022 now on longstanding anticoagulation with Eliquis, hyperlipidemia, gastroesophageal reflux disease, multiple thyroid nodules, coronary artery disease and factor V Leiden stated in 1 part of her record ABI in our clinic was 0.66 on the right and 0.49 on the left Although I could not elicit any complaints of pain at night or with activity Electronic Signature(s) Tara Ramos ([id number]) [id number] Page 2 of 7 Signed: 05/21/2022 4:38:56 PM By: Tara Ham MD Entered By: Tara Ramos on 05/21/2022 12:23:09 -------------------------------------------------------------------------------- Physical Exam Details Patient Name: Date of Service: Tara Heck, MA Tara N J. 05/21/2022 10:30 A M Medical Record Number: [id number] Patient Account Number: [id number] Date of Birth/Sex: Treating RN: 25-Dec-1931 (89 y.o. Tara Ramos Primary Care Provider: Ramonita Ramos Other Clinician: Referring Provider: Treating Provider/Extender: Tara Ramos: 0 Constitutional Sitting or standing Blood Pressure is within target range for patient.. Pulse regular and within target range for patient.Marland Kitchen Respirations regular, non-labored and within target range.. Temperature is normal and within the target range for the patient.Marland Kitchen appears in no distress. Respiratory Respiratory effort is easy and symmetric bilaterally. Rate is normal at rest and on room air.. Bilateral breath sounds are clear and equal in all lobes with no wheezes, rales or rhonchi.. Cardiovascular Heart rhythm and rate regular, without murmur or gallop.JVP not elevated no sacral edema no gallops. Dorsalis pedis pulse was palpable on the right however the posterior tibial was not. 2-3+ pitting edema in  both legs to the upper part of her lower leg. No evidence of infection. On the anterior part of the right greater than left leg there is hemosiderin deposition. Her wounds are actually  healed. Notes Wound exam; her wounds on the right leg are healed. Her daughter was able to show me a picture from a week ago at which time she had eschared over 2 wound areas but the eschar is gone there is no open wounds no weeping Electronic Signature(s) Signed: 05/21/2022 4:38:56 PM By: Tara Ham MD Entered By: Tara Ramos on 05/21/2022 12:25:03 -------------------------------------------------------------------------------- Physician Orders Details Patient Name: Date of Service: Tara Heck, MA Tara N J. 05/21/2022 10:30 A M Medical Record Number: [id number] Patient Account Number: [id number] Date of Birth/Sex: Treating RN: 09/12/1931 (89 y.o. Tara Ramos Primary Care Provider: Ramonita Ramos Other Clinician: Referring Provider: Treating Provider/Extender: Tara Ramos: 0 Verbal / Phone Orders: No Diagnosis Coding ICD-10 Coding Code Description Tara Ramos ([id number]) [id number] Page 3 of 7 I87.311 Chronic venous hypertension (idiopathic) with ulcer of right lower extremity L97.818 Non-pressure chronic ulcer of other part of right lower leg with other specified severity I70.201 Unspecified atherosclerosis of native arteries of extremities, right leg Electronic Signature(s) Signed: 05/21/2022 4:39:52 PM By: Tara Loud MSN RN CNS WTA Signed: 05/21/2022 4:51:40 PM By: Tara Ham MD Entered By: Tara Ramos on 05/21/2022 16:39:52 -------------------------------------------------------------------------------- Problem List Details Patient Name: Date of Service: Tara Heck, MA Tara N J. 05/21/2022 10:30 A M Medical Record Number: [id number] Patient Account Number: [id number] Date of Birth/Sex: Treating RN: [birth year] (89 y.o. Tara Ramos Primary Care Provider: Ramonita Ramos Other Clinician: Referring Provider: Treating Provider/Extender: Tara Ramos, Tara Ramos: 0 Active Problems ICD-10 Encounter Code Description Active Date MDM Diagnosis I87.311 Chronic venous hypertension (idiopathic) with ulcer of right lower extremity 05/21/2022 No Yes L97.818 Non-pressure chronic ulcer of other part of right lower leg with other specified 05/21/2022 No Yes severity I70.201 Unspecified atherosclerosis of native arteries of extremities, right leg 05/21/2022 No Yes Inactive Problems Resolved Problems Electronic Signature(s) Signed: 05/21/2022 4:40:51 PM By: Tara Loud MSN RN CNS WTA Signed: 05/21/2022 4:51:40 PM By: Tara Ham MD Previous Signature: 05/21/2022 4:38:56 PM Version By: Tara Ham MD Entered By: Tara Ramos on 05/21/2022 16:40:51 MAYKAYLA, HIGHLEY ([id number]) [id number] Page 4 of 7 -------------------------------------------------------------------------------- Progress Note Details Patient Name: Date of Service: Tara Ramos, [place] Angelina Ok 05/21/2022 10:30 A M Medical Record Number: [id number] Patient Account Number: [id number] Date of Birth/Sex: Treating RN: [birth year] (89 y.o. Tara Ramos Primary Care Provider: Ramonita Ramos Other Clinician: Referring Provider: Treating Provider/Extender: Tara Ramos, Tara Ramos: 0 Subjective Chief Complaint Information obtained from Patient 05/21/2022; patient is here for review of 2 wounds on the right anterior lower leg which have healed since the appointment was initially made History of Present Illness (HPI) ADMISSION 05/21/2022 This is an 89 year old woman who was seen by her primary doctor earlier this month noted to have wounds on her medial right lower extremity and sent here. Also noted to have bilateral lower extremity edema/lymphedema. Was recommended she use Polysporin or  hydrogen peroxide as well as Vaseline gauze. I think she was also put on antibiotics but I could not really determine which one. In any case since the patient saw her primary physician 3 weeks ago the wounds have healed although she still has bilateral edema. Patient's past medical history is extensive including history of renal cell CA status post right nephrectomy, right breast cancer, hypertension, chronic kidney disease stage IIIb history of a massive pulmonary embolism with embolectomy in December  2022 now on longstanding anticoagulation with Eliquis, hyperlipidemia, gastroesophageal reflux disease, multiple thyroid nodules, coronary artery disease and factor V Leiden stated in 1 part of her record ABI in our clinic was 0.66 on the right and 0.49 on the left Although I could not elicit any complaints of pain at night or with activity Patient History Information obtained from Patient. Allergies lisinopril (Severity: Moderate), morphine (Severity: Moderate), adhesive tape (Severity: Mild) Social History Never smoker, Marital Status - Widowed, Alcohol Use - Never, Drug Use - No History, Caffeine Use - Rarely. Medical History Cardiovascular Patient has history of Coronary Artery Disease, Hypertension Endocrine Denies history of Type I Diabetes, Type II Diabetes Oncologic Patient has history of Received Chemotherapy, Received Radiation Review of Systems (ROS) Constitutional Symptoms (General Health) Denies complaints or symptoms of Fatigue, Fever, Chills, Marked Weight Change. Eyes Denies complaints or symptoms of Dry Eyes, Vision Changes, Glasses / Contacts. Ear/Nose/Mouth/Throat Denies complaints or symptoms of Difficult clearing ears, Sinusitis. Hematologic/Lymphatic Denies complaints or symptoms of Bleeding / Clotting Disorders, Human Immunodeficiency Virus. Respiratory Denies complaints or symptoms of Chronic or frequent coughs, Shortness of Breath. Integumentary (Skin) Complains  or has symptoms of Wounds, Swelling. Musculoskeletal Denies complaints or symptoms of Muscle Pain, Muscle Weakness. Neurologic Denies complaints or symptoms of Numbness/parasthesias, Focal/Weakness. LOMETA, RIGGIN ([id number]) [id number] Page 5 of 7 Objective Constitutional Sitting or standing Blood Pressure is within target range for patient.. Pulse regular and within target range for patient.Marland Kitchen Respirations regular, non-labored and within target range.. Temperature is normal and within the target range for the patient.Marland Kitchen appears in no distress. Vitals Time Taken: 10:55 AM, Height: 63 in, Source: Stated, Weight: 150 lbs, BMI: 26.6, Temperature: 98.0 F, Pulse: 65 bpm, Respiratory Rate: 16 breaths/min, Blood Pressure: 132/79 mmHg. Respiratory Respiratory effort is easy and symmetric bilaterally. Rate is normal at rest and on room air.. Bilateral breath sounds are clear and equal in all lobes with no wheezes, rales or rhonchi.. Cardiovascular Heart rhythm and rate regular, without murmur or gallop.JVP not elevated no sacral edema no gallops. Dorsalis pedis pulse was palpable on the right however the posterior tibial was not. 2-3+ pitting edema in both legs to the upper part of her lower leg. No evidence of infection. On the anterior part of the right greater than left leg there is hemosiderin deposition. Her wounds are actually healed. General Notes: Wound exam; her wounds on the right leg are healed. Her daughter was able to show me a picture from a week ago at which time she had eschared over 2 wound areas but the eschar is gone there is no open wounds no weeping Integumentary (Hair, Skin) Wound #1 status is Open. Original cause of wound was Gradually Appeared. The date acquired was: 04/24/2022. The wound is located on the Right,Medial Lower Leg. The wound measures 0.2cm length x 1.7cm width x 0.1cm depth; 0.267cm^2 area and 0.027cm^3 volume. There is no tunneling  or undermining noted. There is a none present amount of drainage noted. There is large (67-100%) pink granulation within the wound bed. Assessment Active Problems ICD-10 Chronic venous hypertension (idiopathic) with ulcer of right lower extremity Non-pressure chronic ulcer of other part of right lower leg with other specified severity Unspecified atherosclerosis of native arteries of extremities, right leg Plan 1. The patient has no open wounds. 2. I have recommended support stockings in the short-term keeping her legs elevated etc. 3. Her edema is mostly pitting which would mean this is probably not lymphedema. More likely chronic venous disease.  I also wondered whether amlodipine could be playing a role here. I could not determine any evidence of systemic fluid volume overload 4. I am not convinced the patient will wear stockings as she has had them at home in the past. 5. By our ABIs that are done in the clinic she may have coexistent PAD but no real symptoms at this point. I did not refer her to vascular surgery 6. If these wounds recur especially in the short-term then we will probably have to be more aggressive about things Electronic Signature(s) Signed: 05/21/2022 4:38:56 PM By: Tara Ham MD Entered By: Tara Ramos on 05/21/2022 12:27:08 -------------------------------------------------------------------------------- ROS/PFSH Details Patient Name: Date of Service: Tara Heck, MA Tara N J. 05/21/2022 10:30 A M Medical Record Number: [id number] Patient Account Number: [id number] Date of Birth/Sex: Treating RN: [birth year] (89 y.o. Tara Ramos Primary Care Provider: Ramonita Ramos Other Clinician: Referring Provider: Treating Provider/Extender: [id number], Tara EL Dominque, [place]and[place] ([id number]) [id number] Page 6 of 7 Weeks in Ramos: 0 Information Obtained From Patient Constitutional Symptoms (General Health) Complaints and  Symptoms: Negative for: Fatigue; Fever; Chills; Marked Weight Change Eyes Complaints and Symptoms: Negative for: Dry Eyes; Vision Changes; Glasses / Contacts Ear/Nose/Mouth/Throat Complaints and Symptoms: Negative for: Difficult clearing ears; Sinusitis Hematologic/Lymphatic Complaints and Symptoms: Negative for: Bleeding / Clotting Disorders; Human Immunodeficiency Virus Respiratory Complaints and Symptoms: Negative for: Chronic or frequent coughs; Shortness of Breath Integumentary (Skin) Complaints and Symptoms: Positive for: Wounds; Swelling Musculoskeletal Complaints and Symptoms: Negative for: Muscle Pain; Muscle Weakness Neurologic Complaints and Symptoms: Negative for: Numbness/parasthesias; Focal/Weakness Cardiovascular Medical History: Positive for: Coronary Artery Disease; Hypertension Endocrine Medical History: Negative for: Type I Diabetes; Type II Diabetes Oncologic Medical History: Positive for: Received Chemotherapy; Received Radiation Immunizations Pneumococcal Vaccine: Received Pneumococcal Vaccination: Yes Received Pneumococcal Vaccination On or After 60th Birthday: Yes Implantable Devices None Family and Social History Never smoker; Marital Status - Widowed; Alcohol Use: Never; Drug Use: No History; Caffeine Use: Rarely Electronic Signature(s) Signed: 05/21/2022 4:51:40 PM By: Tara Ham MD Signed: 05/21/2022 4:58:20 PM By: Tara Loud MSN RN CNS WTA Previous Signature: 05/21/2022 4:38:56 PM Version By: Tara Ham MD [name] ([id number]) [id number] Page 7 of 7 Entered By: Tara Ramos on 05/21/2022 16:38:55 -------------------------------------------------------------------------------- SuperBill Details Patient Name: Date of Service: Tara Ramos, [place] Angelina Ok 05/21/2022 Medical Record Number: [id number] Patient Account Number: [id number] Date of Birth/Sex: Treating RN: 08-23-1931 (89 y.o. Tara Ramos Primary Care Provider: Ramonita Ramos Other Clinician: Referring Provider: Treating Provider/Extender: Tara Ramos, [place] Edmon Crape Weeks in Ramos: 0 Diagnosis Coding ICD-10 Codes Code Description I87.311 Chronic venous hypertension (idiopathic) with ulcer of right lower extremity L97.818 Non-pressure chronic ulcer of other part of right lower leg with other specified severity I70.201 Unspecified atherosclerosis of native arteries of extremities, right leg Facility Procedures : 7 CPT4 Code: 6295284 Description: 99213 - WOUND CARE VISIT-LEV 3 EST PT Modifier: Quantity: 1 Physician Procedures : CPT4 Code Description Modifier [id number] WC PHYS LEVEL 3 NEW PT ICD-10 Diagnosis Description I87.311 Chronic venous hypertension (idiopathic) with ulcer of right lower extremity L97.818 Non-pressure chronic ulcer of other part of right lower leg with  other specified severity I70.201 Unspecified atherosclerosis of native arteries of extremities, right leg Quantity: 1 Electronic Signature(s) Signed: 05/21/2022 5:06:55 PM By: Gretta Cool, BSN, RN, CWS, Kim RN, BSN Previous Signature: 05/21/2022 4:40:29 PM Version By: Tara Loud MSN RN CNS WTA Previous Signature: 05/21/2022 4:51:40 PM Version By: Tara Ham  MD Previous Signature: 05/21/2022 4:38:56 PM Version By: Tara Ham MD Entered By: Gretta Cool, BSN, RN, CWS, Kim on 05/21/2022 17:06:55

## 2022-05-21 NOTE — Progress Notes (Signed)
MIKYLAH, ACKROYD ([id number]) [id number] Page 1 of 7 Visit Report for 05/21/2022 Allergy List Details Patient Name: Date of Service: Gerrit Heck, [place] 05/21/2022 10:30 A M Medical Record Number: [id number] Patient Account Number: [id number] Date of Birth/Sex: Treating RN: 1931-11-27 (89 y.o. [name] Primary Care Heatherly Stenner: [facility] Other Clinician: Referring Adrien Shankar: Treating Harrietta Incorvaia/Extender: RO BSO N, MICHA EL Edmon Crape Weeks in Treatment: 0 Allergies Active Allergies lisinopril Severity: Moderate morphine Severity: Moderate adhesive tape Severity: Mild Allergy Notes Electronic Signature(s) Signed: 05/21/2022 4:38:48 PM By: Rosalio Loud MSN RN CNS WTA Entered By: Rosalio Loud on 05/21/2022 16:38:48 -------------------------------------------------------------------------------- Arrival Information Details Patient Name: Date of Service: Gerrit [place], [place] R[place]. 05/21/2022 10:30 A M Medical Record Number: [id number] Patient Account Number: [id number] Date of Birth/Sex: Treating RN: 03-31-1932 (89 y.o. [name] Primary Care Johnie Makki: [facility] Other Clinician: Referring Rhilee Currin: Treating Ronnette Rump/Extender: RO BSO Delane Ginger, MICHA EL [place] Meth in Treatment: 0 Visit Information Patient Arrived: Ambulatory Arrival Time: 10:48 Accompanied By: daughter Transfer Assistance: None Patient Identification Verified: Yes Secondary Verification Process Completed: Yes Patient Requires Transmission-Based Precautions: No MILA, PAIR ([id number]) [id number] Page 2 of 7 Patient Has Alerts: Yes Patient Alerts: Patient on Blood Thinner Eliquis NOT DIABETIC Electronic Signature(s) Signed: 05/21/2022 4:37:55 PM By: Rosalio Loud MSN RN CNS WTA Entered By: Rosalio Loud on 05/21/2022 16:37:55 -------------------------------------------------------------------------------- Clinic Level of Care Assessment  Details Patient Name: Date of Service: [place], [place] Angelina Ok 05/21/2022 10:30 A M Medical Record Number: [id number] Patient Account Number: [id number] Date of Birth/Sex: Treating RN: August 07, 1931 (89 y.o. [name] Primary Care Deana Krock: [facility] Other Clinician: Referring Vijay Durflinger: Treating Yaelis Scharfenberg/Extender: RO BSO N, MICHA EL Edmon Crape Weeks in Treatment: 0 Clinic Level of Care Assessment Items TOOL 2 Quantity Score '[]'$  - 0 Use when only an EandM is performed on the INITIAL visit ASSESSMENTS - Nursing Assessment / Reassessment X- 1 20 General Physical Exam (combine w/ comprehensive assessment (listed just below) when performed on new pt. evals) X- 1 25 Comprehensive Assessment (HX, ROS, Risk Assessments, Wounds Hx, etc.) ASSESSMENTS - Wound and Skin A ssessment / Reassessment X - Simple Wound Assessment / Reassessment - one wound 1 5 '[]'$  - 0 Complex Wound Assessment / Reassessment - multiple wounds '[]'$  - 0 Dermatologic / Skin Assessment (not related to wound area) ASSESSMENTS - Ostomy and/or Continence Assessment and Care '[]'$  - 0 Incontinence Assessment and Management '[]'$  - 0 Ostomy Care Assessment and Management (repouching, etc.) PROCESS - Coordination of Care X - Simple Patient / Family Education for ongoing care 1 15 '[]'$  - 0 Complex (extensive) Patient / Family Education for ongoing care X- 1 10 Staff obtains Programmer, systems, Records, T Results / Process Orders est '[]'$  - 0 Staff telephones HHA, Nursing Homes / Clarify orders / etc '[]'$  - 0 Routine Transfer to another Facility (non-emergent condition) '[]'$  - 0 Routine Hospital Admission (non-emergent condition) X- 1 15 New Admissions / Biomedical engineer / Ordering NPWT Apligraf, etc. , '[]'$  - 0 Emergency Hospital Admission (emergent condition) X- 1 10 Simple Discharge Coordination '[]'$  - 0 Complex (extensive) Discharge Coordination PROCESS - Special Needs '[]'$  - 0 Pediatric / Minor Patient Management '[]'$  -  0 Isolation Patient Management SHIARA, MCGOUGH ([id number]) [id number] Page 3 of 7 '[]'$  - 0 Hearing / Language / Visual special needs '[]'$  - 0 Assessment of Community assistance (transportation, D/C planning, etc.) '[]'$  - 0 Additional assistance / Altered mentation '[]'$  -  0 Support Surface(s) Assessment (bed, cushion, seat, etc.) INTERVENTIONS - Wound Cleansing / Measurement '[]'$  - 0 Wound Imaging (photographs - any number of wounds) '[]'$  - 0 Wound Tracing (instead of photographs) '[]'$  - 0 Simple Wound Measurement - one wound '[]'$  - 0 Complex Wound Measurement - multiple wounds '[]'$  - 0 Simple Wound Cleansing - one wound '[]'$  - 0 Complex Wound Cleansing - multiple wounds INTERVENTIONS - Wound Dressings '[]'$  - 0 Small Wound Dressing one or multiple wounds '[]'$  - 0 Medium Wound Dressing one or multiple wounds '[]'$  - 0 Large Wound Dressing one or multiple wounds '[]'$  - 0 Application of Medications - injection INTERVENTIONS - Miscellaneous '[]'$  - 0 External ear exam '[]'$  - 0 Specimen Collection (cultures, biopsies, blood, body fluids, etc.) '[]'$  - 0 Specimen(s) / Culture(s) sent or taken to Lab for analysis '[]'$  - 0 Patient Transfer (multiple staff / Civil Service fast streamer / Similar devices) '[]'$  - 0 Simple Staple / Suture removal (25 or less) '[]'$  - 0 Complex Staple / Suture removal (26 or more) '[]'$  - 0 Hypo / Hyperglycemic Management (close monitor of Blood Glucose) '[]'$  - 0 Ankle / Brachial Index (ABI) - do not check if billed separately Has the patient been seen at the hospital within the last three years: Yes Total Score: 100 Level Of Care: New/Established - Level 3 Electronic Signature(s) Unsigned Previous Signature: 05/21/2022 4:58:20 PM Version By: Rosalio Loud MSN RN CNS WTA Entered By: Gretta Cool, BSN, RN, CWS, Kim on 05/21/2022 17:06:44 -------------------------------------------------------------------------------- Lower Extremity Assessment Details Patient Name: Date of Service: Gerrit Heck, [place] RIO Liliana Cline. 05/21/2022 10:30 A M Medical Record Number: [id number] Patient Account Number: [id number] Date of Birth/Sex: Treating RN: 1932/02/01 (89 y.o. [name] Primary Care Kenli Waldo: [facility] Other Clinician: Referring Kanai Hilger: Treating Roselynne Lortz/Extender: Eldridge Dace, [place] EL Seward Meth in Treatment: [address] LASHAUNDA, SCHILD ([id number]) [id number] Page 4 of 7 Edema Assessment Assessed: [Left: Yes] [Right: Yes] [Left: Edema] [Right: :] Calf Left: Right: Point of Measurement: 32 cm From Medial Instep 36.5 cm 37.2 cm Ankle Left: Right: Point of Measurement: 12 cm From Medial Instep 26.5 cm 28 cm Vascular Assessment Pulses: Popliteal Doppler Audible: [Left:Yes] [Right:Yes] Blood Pressure: Brachial: [Left:122] [Right:108] Ankle: [Left:Dorsalis Pedis: 60 0.49] [Right:Dorsalis Pedis: 80 0.66] Electronic Signature(s) Signed: 05/21/2022 4:38:42 PM By: Rosalio Loud MSN RN CNS WTA Entered By: Rosalio Loud on 05/21/2022 16:38:42 -------------------------------------------------------------------------------- Multi Wound Chart Details Patient Name: Date of Service: Gerrit Heck, [place] R[place] N [place]. 05/21/2022 10:30 A M Medical Record Number: [id number] Patient Account Number: [id number] Date of Birth/Sex: Treating RN: February 27, 1932 (89 y.o. [name] Primary Care Skylarr Liz: [facility] Other Clinician: Referring Manveer Gomes: Treating Ennis Delpozo/Extender: RO BSO N, MICHA EL Edmon Crape Weeks in Treatment: 0 Vital Signs Height(in): 63 Pulse(bpm): 65 Weight(lbs): 150 Blood Pressure(mmHg): 132/79 Body Mass Index(BMI): 26.6 Temperature(F): 98.0 Respiratory Rate(breaths/min): 16 [Treatment Notes:Wound Assessments Treatment Notes] Electronic Signature(s) Signed: 05/21/2022 4:39:40 PM By: Rosalio Loud MSN RN CNS WTA Entered By: Rosalio Loud on 05/21/2022 16:39:40 Cherell, Colvin Gordy Clement ([id number]) [id number] Page 5 of  7 -------------------------------------------------------------------------------- Multi-Disciplinary Care Plan Details Patient Name: Date of Service: [place], [place] Angelina Ok 05/21/2022 10:30 A M Medical Record Number: [id number] Patient Account Number: [id number] Date of Birth/Sex: Treating RN: 03-16-1932 (89 y.o. [name] Primary Care Ulices Maack: [facility] Other Clinician: Referring Zohar Laing: Treating Moustafa Mossa/Extender: RO BSO Delane Ginger, [place] EL Edmon Crape Weeks in Treatment: 0 Active Inactive Electronic Signature(s) Signed: 05/21/2022 4:40:36 PM By: Tamala Julian,  Jocelyn Lamer MSN RN CNS WTA Entered By: Rosalio Loud on 05/21/2022 16:40:36 -------------------------------------------------------------------------------- Pain Assessment Details Patient Name: Date of Service: Gerrit Heck, [place] 05/21/2022 10:30 A M Medical Record Number: [id number] Patient Account Number: [id number] Date of Birth/Sex: Treating RN: 12/20/1931 (89 y.o. [name] Primary Care Xabi Wittler: [facility] Other Clinician: Referring Raelie Lohr: Treating Briley Bumgarner/Extender: RO BSO Delane Ginger, MICHA EL Edmon Crape Weeks in Treatment: 0 Active Problems Location of Pain Severity and Description of Pain Patient Has Paino No Site Locations NUPUR, HOHMAN ([id number]) [id number] Page 6 of 7 Pain Management and Medication Current Pain Management: Electronic Signature(s) Signed: 05/21/2022 4:38:00 PM By: Rosalio Loud MSN RN CNS WTA Entered By: Rosalio Loud on 05/21/2022 16:37:59 -------------------------------------------------------------------------------- Patient/Caregiver Education Details Patient Name: Date of Service: Gerrit Heck, [place] RIO Liliana Cline 1/25/2024andnbsp10:30 A M Medical Record Number: [id number] Patient Account Number: [id number] Date of Birth/Gender: Treating RN: May 07, 1931 (89 y.o. [name] Primary Care Physician: [facility] Other Clinician: Referring Physician: Treating Physician/Extender:  RO BSO N, [place] EL Seward Meth in Treatment: 0 Education Assessment Education Provided To: Patient and Caregiver Education Topics Provided Welcome [facility] Packet: o Handouts: Welcome T [facility] o Methods: Explain/Verbal Responses: State content correctly Electronic Signature(s) Signed: 05/21/2022 4:58:20 PM By: Rosalio Loud MSN RN CNS WTA Entered By: Rosalio Loud on 05/21/2022 16:40:33 -------------------------------------------------------------------------------- Vitals Details Patient Name: Date of Service: Gerrit Heck, [place] R[place] N J. 05/21/2022 10:30 A M Medical Record Number: [id number] Patient Account Number: [id number] Date of Birth/Sex: Treating RN: 10/01/1931 (89 y.o. [name] Primary Care Juandaniel Manfredo: [facility] Other Clinician: Referring Zenith Lamphier: Treating Sloan Galentine/Extender: RO BSO Delane Ginger, [place] EL Edmon Crape Weeks in Treatment: 0 Vital Signs OBELIA, BONELLO ([id number]) [id number] Page 7 of 7 Time Taken: 10:55 Temperature (F): 98.0 Height (in): 63 Pulse (bpm): 65 Source: Stated Respiratory Rate (breaths/min): 16 Weight (lbs): 150 Blood Pressure (mmHg): 132/79 Body Mass Index (BMI): 26.6 Reference Range: 80 - 120 mg / dl Electronic Signature(s) Signed: 05/21/2022 4:38:03 PM By: Rosalio Loud MSN RN CNS WTA Entered By: Rosalio Loud on 05/21/2022 16:38:03

## 2022-05-21 NOTE — Progress Notes (Signed)
Tara Ramos, Tara Ramos ([id number]) [id number] [id number] Page 1 of 5 Visit Report for 05/21/2022 Abuse Risk Screen Details Patient Name: Date of Service: Tara Ramos, Tara Ramos 05/21/2022 10:30 A M Medical Record Number: [id number] Patient Account Number: [id number] Date of Birth/Sex: Treating RN: 1931-11-18 (89 y.o. Tara Ramos Primary Care Jamylah Marinaccio: [facility] Other Clinician: Referring Oswaldo Cueto: Treating Karizma Cheek/Extender: RO BSO Delane Ginger, MICHA EL Edmon Crape Weeks in Treatment: 0 Abuse Risk Screen Items Answer Electronic Signature(s) Signed: 05/21/2022 4:38:58 PM By: Rosalio Loud MSN RN CNS WTA Entered By: Rosalio Loud on 05/21/2022 16:38:58 -------------------------------------------------------------------------------- Activities of Daily Living Details Patient Name: Date of Service: Tara Ramos, Tara Ramos 05/21/2022 10:30 A M Medical Record Number: [id number] Patient Account Number: [id number] Date of Birth/Sex: Treating RN: 12/29/1931 (89 y.o. Tara Ramos Primary Care Naesha Buckalew: [facility] Other Clinician: Referring Affan Callow: Treating Abby Stines/Extender: RO BSO N, [place] EL Edmon Crape Weeks in Treatment: 0 Activities of Daily Living Items Answer Activities of Daily Living (Please select one for each item) Drive Automobile Completely Able T Medications ake Completely Able Use T elephone Completely Able Care for Appearance Completely Able Use T oilet Completely Able Bath / Shower Completely Able Dress Self Completely Able Feed Self Completely Able Walk Completely Able Get In / Out Bed Completely Able Housework Completely Able Prepare Meals Completely Able Handle Money Completely Able Shop for Self Completely Tara Ramos, Tara Ramos ([id number]) [phone] [id number] Page 2 of 5 Electronic Signature(s) Signed: 05/21/2022 4:39:03 PM By: Rosalio Loud MSN RN CNS WTA Entered By: Rosalio Loud on 05/21/2022  16:39:03 -------------------------------------------------------------------------------- Education Screening Details Patient Name: Date of Service: Tara Heck, [place] RIO N J. 05/21/2022 10:30 A M Medical Record Number: [id number] Patient Account Number: [id number] Date of Birth/Sex: Treating RN: 1931/12/26 (89 y.o. Tara Ramos Primary Care Lula Michaux: [facility] Other Clinician: Referring Julanne Schlueter: Treating Osiah Haring/Extender: Eldridge Dace, [place] EL Seward Meth in Treatment: 0 Primary Learner Assessed: Patient Learning Preferences/Education Level/Primary Language Learning Preference: Explanation, Demonstration Highest Education Level: High School Preferred Language: English Cognitive Barrier Language Barrier: No Translator Needed: No Memory Deficit: No Emotional Barrier: No Cultural/Religious Beliefs Affecting Medical Care: No Physical Barrier Impaired Vision: Yes Glasses Impaired Hearing: Yes Hearing Aid Decreased Hand dexterity: No Knowledge/Comprehension Knowledge Level: High Comprehension Level: High Ability to understand written instructions: High Ability to understand verbal instructions: High Motivation Anxiety Level: Calm Cooperation: Cooperative Education Importance: Acknowledges Need Interest in Health Problems: Asks Questions Perception: Coherent Willingness to Engage in Self-Management High Activities: Readiness to Engage in Self-Management High Activities: Electronic Signature(s) Signed: 05/21/2022 4:39:08 PM By: Rosalio Loud MSN RN CNS WTA Entered By: Rosalio Loud on 05/21/2022 16:39:07 Tara Ramos, Tara Ramos ([id number]) [id number] Page 3 of 5 -------------------------------------------------------------------------------- Fall Risk Assessment Details Patient Name: Date of Service: Tara Ramos, [place] Angelina Ok 05/21/2022 10:30 A M Medical Record Number: [id number] Patient Account Number: [id number] Date of Birth/Sex: Treating  RN: [birth year] (89 y.o. Tara Ramos Primary Care Romy Mcgue: [facility] Other Clinician: Referring Lamarkus Nebel: Treating Berneice Zettlemoyer/Extender: Eldridge Dace, [place] EL Edmon Crape Weeks in Treatment: 0 Fall Risk Assessment Items Have you had 2 or more falls in the last 12 monthso 0 No Have you had any fall that resulted in injury in the last 12 monthso 0 No FALLS RISK SCREEN History of falling - immediate or within 3 months 0 No Secondary diagnosis (Do you have 2 or more medical diagnoseso) 0 No Ambulatory aid None/bed rest/wheelchair/nurse 0 No Crutches/cane/walker  0 No Furniture 0 No Intravenous therapy Access/Saline/Heparin Lock 0 No Gait/Transferring Normal/ bed rest/ wheelchair 0 No Weak (short steps with or without shuffle, stooped but able to lift head while walking, may seek 0 No support from furniture) Impaired (short steps with shuffle, may have difficulty arising from chair, head down, impaired 0 No balance) Mental Status Oriented to own ability 0 Yes Electronic Signature(s) Signed: 05/21/2022 4:39:25 PM By: Rosalio Loud MSN RN CNS WTA Entered By: Rosalio Loud on 05/21/2022 16:39:25 -------------------------------------------------------------------------------- Foot Assessment Details Patient Name: Date of Service: Tara Heck, [place] RIO N J. 05/21/2022 10:30 A M Medical Record Number: [id number] Patient Account Number: [id number] Date of Birth/Sex: Treating RN: [birth year] (89 y.o. Tara Ramos Primary Care Grete Bosko: [facility] Other Clinician: Referring Zyhir Cappella: Treating Marce Charlesworth/Extender: RO BSO Delane Ginger, [place] EL Edmon Crape Weeks in Treatment: 0 Foot Assessment Items [address] Tara Ramos, Tara Ramos ([id number]) [phone] [id number] Page 4 of 5 + = Sensation present, - = Sensation absent, C = Callus, U = Ulcer R = Redness, W = Warmth, M = Maceration, PU = Pre-ulcerative lesion F = Fissure, S = Swelling, D = Dryness Assessment Right: Left: Other  Deformity: No No Prior Foot Ulcer: No No Prior Amputation: No No Charcot Joint: No No Ambulatory Status: Ambulatory Without Help Gait: Steady Electronic Signature(s) Signed: 05/21/2022 4:39:33 PM By: Rosalio Loud MSN RN CNS WTA Entered By: Rosalio Loud on 05/21/2022 16:39:33 -------------------------------------------------------------------------------- Nutrition Risk Screening Details Patient Name: Date of Service: Tara Heck, [place] R[place] N [place]. 05/21/2022 10:30 A M Medical Record Number: [id number] Patient Account Number: [id number] Date of Birth/Sex: Treating RN: 06/04/1931 (89 y.o. Tara Ramos Primary Care Anyah Swallow: [facility] Lab Other Clinician: Referring Marlow Hendrie: Treating Jance Siek/Extender: RO BSO N, [place] EL Edmon Crape Weeks in Treatment: 0 Height (in): 63 Weight (lbs): 150 Body Mass Index (BMI): 26.6 Nutrition Risk Screening Items Score Screening NUTRITION RISK SCREEN: I have an illness or condition that made me change the kind and/or amount of food I eat 0 No I eat fewer than two meals per day 0 No I eat few fruits and vegetables, or milk products 0 No I have three or more drinks of beer, liquor or wine almost every day 0 No I have tooth or mouth problems that make it hard for me to eat 0 No I don't always have enough money to buy the food I need 0 No Tara Ramos, Tara Ramos ([id number]) [phone] [id number] Page 5 of 5 I eat alone most of the time 0 No I take three or more different prescribed or over-the-counter drugs a day 0 No Without wanting to, I have lost or gained 10 pounds in the last six months 0 No I am not always physically able to shop, cook and/or feed myself 0 No Nutrition Protocols Good Risk Protocol 0 No interventions needed Moderate Risk Protocol High Risk Proctocol Risk Level: Good Risk Score: 0 Electronic Signature(s) Signed: 05/21/2022 4:39:30 PM By: Rosalio Loud MSN RN CNS WTA Entered By: Rosalio Loud on 05/21/2022 16:39:30

## 2022-06-11 NOTE — Progress Notes (Incomplete)
02/20/2022 10:30 AM   Tara Ramos 03-12-1932 [id number]  Referring provider: Adin Hector, MD [facility] [place],  [place] [address]  Urological history: 1. Renal cell carcinoma -R robotic nephrectomy per Dr. Alyson Ingles on 02/01/2015.  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 female with bilateral renal masses s/p right robotic nephrectomy in [place] by Dr. Alyson Ingles in 02/01/15 and CT guided microwave ablation of the left renal mass 05/24/15.  She returns today for routine annual surveillance with imaging.  She was last seen 1 year ago.   PMH: Past Medical History:  Diagnosis Date   Anticoagulant long-term use    Arthritis    Breast cancer ([place]) 1992   right breast/radiation   Breast cancer ([place]) 1990   left breast/radiation   Breast cancer ([place]) 2014   right breast   Breast cancer, right breast ([place]) 12/16/2012   left, then right, then right breast cancers (lumpectomies and radiation therapy)   Chronic kidney disease    Dysrhythmia    HX A FIB - FOLLOWED BY [name]   GERD (gastroesophageal reflux disease)    Heart murmur    hx of years ago    Hemorrhoids    Hypertension    Personal history of radiation therapy    1990/1992   Pulmonary emboli ([facility])    Renal cell cancer, right ([place]) 11/2014   Right Nephrectomy and Left renal ablation for lesion.    Renal insufficiency    Status post radiation therapy    breast cancer bilateral    Surgical History: Past Surgical History:  Procedure Laterality Date   BREAST BIOPSY Right 03/10/2016   -  STROMAL SCLEROSIS, GIANT CELL REACTION, CHRONIC INFLAMMATION AND FAT    BREAST BIOPSY Right 12/12/2019   [place] bx of mass, vision marker, path pending   BREAST  BIOPSY Right 12/12/2019   [place] bx of LN, hydromarker, path pending   BREAST BIOPSY Left 12/12/2019   stereo bx of calcs, coil marker, path pending   BREAST EXCISIONAL BIOPSY Right 2014   +   BREAST EXCISIONAL BIOPSY Right 1992   +   BREAST EXCISIONAL BIOPSY Left 1990   +   BREAST LUMPECTOMY Left 1990   BREAST LUMPECTOMY Right 1992   BROW LIFT Bilateral 11/10/2019   Procedure: BLEPHAROPLASTY UPPER EYELID; W/EXCESS SKIN BILATERAL & ECTROPION REPAIR, EXTENSIVE LEFT LOWER LID;  Surgeon: Karle Starch, MD;  Location: [facility];  Service: Ophthalmology;  Laterality: Bilateral;   DILATION AND CURETTAGE OF UTERUS     EYE SURGERY Bilateral    cataract extraction   IR THROMBECT PRIM MECH INIT (INCLU) MOD SED  04/26/2021   IR US GUIDE VASC ACCESS RIGHT  04/26/2021   KYPHOPLASTY N/A 10/09/2014   Procedure: KYPHOPLASTY;  Surgeon: Hessie Knows, MD;  Location: [facility];  Service: Orthopedics;  Laterality: N/A;  T7 Kyphoplasty with bone biopsy   MASTECTOMY MODIFIED RADICAL Right 01/08/2020   Procedure: MASTECTOMY MODIFIED RADICAL;  Surgeon: Robert Bellow, MD;  Location: [facility];  Service: General;  Laterality: Right;   OTHER SURGICAL HISTORY     radiation therapy breast   ROBOT ASSISTED LAPAROSCOPIC NEPHRECTOMY Right 02/01/2015   Procedure: ROBOTIC ASSISTED LAPAROSCOPIC RIGHT  NEPHRECTOMY;  Surgeon: Cleon Gustin, MD;  Location: [facility];  Service: Urology;  Laterality: Right;   SIMPLE MASTECTOMY WITH AXILLARY SENTINEL NODE BIOPSY Left 01/08/2020   Procedure: SIMPLE MASTECTOMY;  Surgeon: Robert Bellow, MD;  Location: [facility];  Service: General;  Laterality: Left;    Home Medications:  Allergies as of 02/20/2022       Reactions   Morphine And Related Nausea And Vomiting   Lisinopril Cough   Tape Other (See Comments)   Blisters underneath; wide white hypofix tape post kidney biopsy         Medication List        Accurate as of February 20, 2022 10:30 AM. If you have any  questions, ask your nurse or doctor.          STOP taking these medications    spironolactone 25 MG tablet Commonly known as: ALDACTONE Stopped by: Julliette Frentz, PA-C       TAKE these medications    amLODipine 10 MG tablet Commonly known as: NORVASC Take 10 mg by mouth daily.   apixaban 5 MG Tabs tablet Commonly known as: ELIQUIS Take 1 tablet (5 mg total) by mouth 2 (two) times daily.   Calcium + D3 600-200 MG-UNIT Tabs Take 1 tablet by mouth daily.   docusate sodium 100 MG capsule Commonly known as: COLACE Take 100 mg by mouth at bedtime.   losartan 100 MG tablet Commonly known as: COZAAR Take 100 mg by mouth every morning.   metoprolol tartrate 100 MG tablet Commonly known as: LOPRESSOR Take 100 mg by mouth 2 (two) times daily.   multivitamin with minerals Tabs tablet Take 1 tablet by mouth daily.   vitamin C 1000 MG tablet Take 1,000 mg by mouth daily.        Allergies:  Allergies  Allergen Reactions   Morphine And Related Nausea And Vomiting   Lisinopril Cough   Tape Other (See Comments)    Blisters underneath; wide white hypofix tape post kidney biopsy     Family History: Family History  Problem Relation Age of Onset   Stomach cancer Mother    Stroke Father    Brain cancer Sister    Breast cancer Daughter 26   Bladder Cancer Neg Hx    Prostate cancer Neg Hx    Kidney cancer Neg Hx     Social History:  reports that she quit smoking about 49 years ago. Her smoking use included cigarettes. She has never used smokeless tobacco. She reports that she does not currently use alcohol after a past usage of about 1.0 standard drink of alcohol per week. She reports that she does not use drugs.  ROS: For pertinent review of systems please refer to history of present illness  Physical Exam: BP 133/73   Pulse 75   Ht 5' 2"$  (1.575 m)   Wt 168 lb (76.2 kg)   BMI 30.73 kg/m   Constitutional:  Well nourished. Alert and oriented, No acute  distress. HEENT: [place] AT, moist mucus membranes.  Trachea midline, no masses. Cardiovascular: No clubbing, cyanosis, or edema. Respiratory: Normal respiratory effort, no increased work of breathing. GU: No CVA tenderness.  No bladder fullness or masses. Vulvovaginal atrophy w/ pallor, loss of rugae, introital retraction, excoriations.  Vulvar thinning, fusion of labia, clitoral hood retraction, prominent urethral meatus.   *** external genitalia, *** pubic hair distribution, no lesions.  Normal urethral meatus, no lesions, no prolapse, no discharge.   No urethral masses,  tenderness and/or tenderness. No bladder fullness, tenderness or masses. *** vagina mucosa, *** estrogen effect, no discharge, no lesions, *** pelvic support, *** cystocele and *** rectocele noted.  No cervical motion tenderness.  Uterus is freely mobile and non-fixed.  No adnexal/parametria masses or tenderness noted.  Anus and perineum are without rashes or lesions.   ***  Neurologic: Grossly intact, no focal deficits, moving all 4 extremities. Psychiatric: Normal mood and affect.    Laboratory data Serum creatinine (12/2021) 1.4 Color Colorless, Straw, Light Yellow, Yellow, Dark Yellow Light Yellow  Clarity Clear Clear  Specific Gravity 1.005 - 1.030 1.012  pH, Urine 5.0 - 8.0 6.0  Protein, Urinalysis Negative mg/dL Negative  Glucose, Urinalysis Negative mg/dL Negative  Ketones, Urinalysis Negative mg/dL Negative  Blood, Urinalysis Negative Negative  Nitrite, Urinalysis Negative Negative  Leukocyte Esterase, Urinalysis Negative Negative  Bilirubin, Urinalysis Negative Negative  Urobilinogen, Urinalysis 0.2 - 1.0 mg/dL 0.2  WBC, UA <=5 /hpf 1  Red Blood Cells, Urinalysis <=3 /hpf 0  Bacteria, Urinalysis 0 - 5 /hpf 0-5  Squamous Epithelial Cells, Urinalysis /hpf 2  Resulting Agency  [facility]   Specimen Collected: 01/15/22 09:24   Performed by: [place]: 01/15/22 12:59   Received From: [facility]  Result Received: 02/19/22 10:25  I have reviewed the labs.   Pertinent Imaging: *** I have independently reviewed the films.  See HPI.   Assessment & Plan:    1. Renal cell cancer -cancer history as above -Discussed findings on the CT from February -NED on recent CT -continue to follow up with yearly ultrasound  2. CKD -creatinine stable   3.  Nocturia -She does not find it bothersome at this time  Return for February 2024 for R [place] report.  Alysah Carton, [place] [address] [place], [place] [address] [phone]

## 2022-06-12 ENCOUNTER — Ambulatory Visit: Payer: PPO | Admitting: Urology

## 2022-06-12 DIAGNOSIS — C649 Malignant neoplasm of unspecified kidney, except renal pelvis: Secondary | ICD-10-CM

## 2022-06-12 DIAGNOSIS — N189 Chronic kidney disease, unspecified: Secondary | ICD-10-CM

## 2022-06-22 ENCOUNTER — Other Ambulatory Visit: Payer: Self-pay | Admitting: Internal Medicine

## 2022-06-22 ENCOUNTER — Ambulatory Visit
Admission: RE | Admit: 2022-06-22 | Discharge: 2022-06-22 | Disposition: A | Discharge status: Home / Self Care | Payer: PPO | Source: Ambulatory Visit | Attending: Internal Medicine | Admitting: Internal Medicine

## 2022-06-22 DIAGNOSIS — R4701 Aphasia: Secondary | ICD-10-CM | POA: Insufficient documentation

## 2022-06-22 DIAGNOSIS — D6869 Other thrombophilia: Secondary | ICD-10-CM | POA: Diagnosis not present

## 2022-06-22 DIAGNOSIS — R410 Disorientation, unspecified: Secondary | ICD-10-CM | POA: Insufficient documentation

## 2022-06-22 DIAGNOSIS — E7849 Other hyperlipidemia: Secondary | ICD-10-CM | POA: Diagnosis not present

## 2022-06-22 DIAGNOSIS — I700 Atherosclerosis of aorta: Secondary | ICD-10-CM | POA: Diagnosis not present

## 2022-06-22 DIAGNOSIS — R634 Abnormal weight loss: Secondary | ICD-10-CM | POA: Diagnosis not present

## 2022-06-22 DIAGNOSIS — Z85528 Personal history of other malignant neoplasm of kidney: Secondary | ICD-10-CM | POA: Diagnosis not present

## 2022-06-22 DIAGNOSIS — G936 Cerebral edema: Secondary | ICD-10-CM | POA: Diagnosis not present

## 2022-06-22 DIAGNOSIS — Z853 Personal history of malignant neoplasm of breast: Secondary | ICD-10-CM | POA: Diagnosis not present

## 2022-06-22 DIAGNOSIS — N1832 Chronic kidney disease, stage 3b: Secondary | ICD-10-CM | POA: Diagnosis not present

## 2022-06-22 DIAGNOSIS — M810 Age-related osteoporosis without current pathological fracture: Secondary | ICD-10-CM | POA: Diagnosis not present

## 2022-06-22 DIAGNOSIS — E042 Nontoxic multinodular goiter: Secondary | ICD-10-CM | POA: Diagnosis not present

## 2022-06-22 DIAGNOSIS — R4182 Altered mental status, unspecified: Secondary | ICD-10-CM | POA: Diagnosis not present

## 2022-06-22 DIAGNOSIS — I10 Essential (primary) hypertension: Secondary | ICD-10-CM | POA: Diagnosis not present

## 2022-06-22 DIAGNOSIS — D6851 Activated protein C resistance: Secondary | ICD-10-CM | POA: Diagnosis not present

## 2022-06-22 DIAGNOSIS — I480 Paroxysmal atrial fibrillation: Secondary | ICD-10-CM | POA: Diagnosis not present

## 2022-07-08 DIAGNOSIS — C719 Malignant neoplasm of brain, unspecified: Secondary | ICD-10-CM | POA: Diagnosis not present

## 2022-07-08 DIAGNOSIS — E785 Hyperlipidemia, unspecified: Secondary | ICD-10-CM | POA: Diagnosis not present

## 2022-07-08 DIAGNOSIS — R4701 Aphasia: Secondary | ICD-10-CM | POA: Diagnosis not present

## 2022-07-08 DIAGNOSIS — I129 Hypertensive chronic kidney disease with stage 1 through stage 4 chronic kidney disease, or unspecified chronic kidney disease: Secondary | ICD-10-CM | POA: Diagnosis not present

## 2022-07-08 DIAGNOSIS — N1831 Chronic kidney disease, stage 3a: Secondary | ICD-10-CM | POA: Diagnosis not present

## 2022-07-08 DIAGNOSIS — R634 Abnormal weight loss: Secondary | ICD-10-CM | POA: Diagnosis not present

## 2022-07-08 DIAGNOSIS — R630 Anorexia: Secondary | ICD-10-CM | POA: Diagnosis not present

## 2022-07-27 DEATH — deceased
